# Patient Record
Sex: Male | Born: 1960 | Race: Black or African American | Hispanic: No | Marital: Married | Smoking: Former smoker
Health system: Southern US, Community
[De-identification: ages and names within clinical notes are randomized; demographics above are authoritative.]

## PROBLEM LIST (undated history)

## (undated) DIAGNOSIS — J439 Emphysema, unspecified: Secondary | ICD-10-CM

## (undated) DIAGNOSIS — J302 Other seasonal allergic rhinitis: Secondary | ICD-10-CM

## (undated) DIAGNOSIS — R058 Other specified cough: Secondary | ICD-10-CM

## (undated) DIAGNOSIS — R05 Cough: Secondary | ICD-10-CM

## (undated) DIAGNOSIS — I10 Essential (primary) hypertension: Secondary | ICD-10-CM

## (undated) DIAGNOSIS — J9600 Acute respiratory failure, unspecified whether with hypoxia or hypercapnia: Secondary | ICD-10-CM

## (undated) DIAGNOSIS — R911 Solitary pulmonary nodule: Secondary | ICD-10-CM

## (undated) DIAGNOSIS — T464X5A Adverse effect of angiotensin-converting-enzyme inhibitors, initial encounter: Secondary | ICD-10-CM

## (undated) DIAGNOSIS — K449 Diaphragmatic hernia without obstruction or gangrene: Secondary | ICD-10-CM

## (undated) DIAGNOSIS — N200 Calculus of kidney: Secondary | ICD-10-CM

## (undated) DIAGNOSIS — E785 Hyperlipidemia, unspecified: Secondary | ICD-10-CM

## (undated) DIAGNOSIS — G4736 Sleep related hypoventilation in conditions classified elsewhere: Secondary | ICD-10-CM

## (undated) HISTORY — DX: Adverse effect of angiotensin-converting-enzyme inhibitors, initial encounter: T46.4X5A

## (undated) HISTORY — DX: Diaphragmatic hernia without obstruction or gangrene: K44.9

## (undated) HISTORY — DX: Emphysema, unspecified: G47.36

## (undated) HISTORY — DX: Cough: R05

## (undated) HISTORY — DX: Other seasonal allergic rhinitis: J30.2

## (undated) HISTORY — DX: Essential (primary) hypertension: I10

## (undated) HISTORY — DX: Calculus of kidney: N20.0

## (undated) HISTORY — DX: Hyperlipidemia, unspecified: E78.5

## (undated) HISTORY — DX: Acute respiratory failure, unspecified whether with hypoxia or hypercapnia: J96.00

## (undated) HISTORY — DX: Solitary pulmonary nodule: R91.1

## (undated) HISTORY — DX: Emphysema, unspecified: J43.9

## (undated) HISTORY — DX: Other specified cough: R05.8

---

## 2000-04-04 ENCOUNTER — Emergency Department (HOSPITAL_COMMUNITY): Admission: EM | Admit: 2000-04-04 | Discharge: 2000-04-04 | Payer: Self-pay | Admitting: Emergency Medicine

## 2000-04-04 ENCOUNTER — Encounter: Payer: Self-pay | Admitting: Emergency Medicine

## 2003-07-27 HISTORY — PX: KNEE ARTHROSCOPY: SHX127

## 2005-04-19 ENCOUNTER — Emergency Department (HOSPITAL_COMMUNITY): Admission: EM | Admit: 2005-04-19 | Discharge: 2005-04-19 | Payer: Self-pay | Admitting: Emergency Medicine

## 2005-04-21 ENCOUNTER — Encounter: Admission: RE | Admit: 2005-04-21 | Discharge: 2005-04-21 | Payer: Self-pay | Admitting: Occupational Medicine

## 2009-12-22 ENCOUNTER — Emergency Department (HOSPITAL_COMMUNITY): Admission: EM | Admit: 2009-12-22 | Discharge: 2009-12-22 | Payer: Self-pay | Admitting: Emergency Medicine

## 2010-05-14 ENCOUNTER — Emergency Department (HOSPITAL_COMMUNITY): Admission: EM | Admit: 2010-05-14 | Discharge: 2010-05-14 | Payer: Self-pay | Admitting: Emergency Medicine

## 2010-06-19 ENCOUNTER — Emergency Department (HOSPITAL_COMMUNITY): Admission: EM | Admit: 2010-06-19 | Discharge: 2010-06-19 | Payer: Self-pay | Admitting: Emergency Medicine

## 2010-07-15 ENCOUNTER — Encounter: Payer: Self-pay | Admitting: Pulmonary Disease

## 2010-07-20 ENCOUNTER — Encounter: Payer: Self-pay | Admitting: Pulmonary Disease

## 2010-08-07 ENCOUNTER — Encounter: Payer: Self-pay | Admitting: Pulmonary Disease

## 2010-08-07 ENCOUNTER — Ambulatory Visit
Admission: RE | Admit: 2010-08-07 | Discharge: 2010-08-07 | Payer: Self-pay | Source: Home / Self Care | Attending: Pulmonary Disease | Admitting: Pulmonary Disease

## 2010-08-07 DIAGNOSIS — R0602 Shortness of breath: Secondary | ICD-10-CM | POA: Insufficient documentation

## 2010-08-07 DIAGNOSIS — E785 Hyperlipidemia, unspecified: Secondary | ICD-10-CM | POA: Insufficient documentation

## 2010-08-07 DIAGNOSIS — I10 Essential (primary) hypertension: Secondary | ICD-10-CM | POA: Insufficient documentation

## 2010-08-07 DIAGNOSIS — J439 Emphysema, unspecified: Secondary | ICD-10-CM | POA: Insufficient documentation

## 2010-08-07 DIAGNOSIS — J309 Allergic rhinitis, unspecified: Secondary | ICD-10-CM | POA: Insufficient documentation

## 2010-08-14 ENCOUNTER — Encounter: Payer: Self-pay | Admitting: Pulmonary Disease

## 2010-08-17 ENCOUNTER — Telehealth: Payer: Self-pay | Admitting: Pulmonary Disease

## 2010-08-27 NOTE — Assessment & Plan Note (Signed)
Summary: CONSULT FOR OBSTRUCTIVE LUNG DISEASE//SH   Visit Type:  Initial Consult Copy to:  Dr. Nadyne Coombes Primary Provider/Referring Provider:  Dr. Fara Boros at [facility] in [place]  CC:  Pulmonary consult.  History of Present Illness: 50 yo male for evaluation of dyspnea.  This started about 4 months ago after he hit his back a broke some ribs.  He has been getting a cough with clear to yellow sputum.  He gets tightness and wheezing in his chest.  He was treated for a bronchitis in November 2011 with prednisone.  He has never been told he has asthma, but he does get seasonal allergies.  He gets winded when he is hot shower, bending, or lifting his arms above his head.  He also gets winded when walking up stairs.  He denies fever, sweats, or chills.  His sinuses have been okay.  He denies gland swelling, or joint swelling.  He will get a rash at times on his hands.  He denies heartburn, or abdominal pain.  He smoked 1.5 packs per day until 1996.  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  He says there is a lot of dust.  He denies any prior history of pneumonia or TB.  He has not had any animal or sick exposures.  He is from [place].  He was stationed in [place] while in [employer] in the 1980's.  He has been using asmanex for the past 2 to 3 months.  He is not sure how much this is helping.  He uses ventolin two times a day.  This may help some, but the effects don't seem to last.  Labs from June 19, 2010 were reviewed and unremarkable.  CXR  Procedure date:  06/19/2010  Findings:      CHEST - 2 VIEW    Comparison: 05/14/2010    Findings: Healed right rib fracture.  Normal heart size.   Hyperaerated lungs.  No consolidation, mass, pleural effusion.    IMPRESSION:   No active cardiopulmonary disease.   Preventive Screening-Counseling & Management  Alcohol-Tobacco     Alcohol drinks/day: <1     Smoking Status: quit     Packs/Day: 1.5     Year Started: 1976     Year Quit:  1996  Current Medications (verified): 1)  Losartan Potassium 100 Mg Tabs (Losartan Potassium) .Marland Kitchen.. 1 By Mouth Daily 2)  Amlodipine Besylate 10 Mg Tabs (Amlodipine Besylate) .Marland Kitchen.. 1 By Mouth Daily 3)  Crestor 20 Mg Tabs (Rosuvastatin Calcium) .... 1/2 By Mouth At Bedtime 4)  Niacin 50 Mg Tabs (Niacin) .... 2 By Mouth At Bedtime 5)  Benzonatate 100 Mg Caps (Benzonatate) .Marland Kitchen.. 1 By Mouth Three Times A Day As Needed 6)  Cetirizine Hcl 10 Mg Tabs (Cetirizine Hcl) .Marland Kitchen.. 1 By Mouth Daily 7)  Vitamin D 2000 Unit Tabs (Cholecalciferol) .Marland Kitchen.. 1 By Mouth Daily 8)  Fish Oil 1000 Mg Caps (Omega-3 Fatty Acids) .... 2 By Mouth Daily 9)  Ventolin Hfa 108 (90 Base) Mcg/act Aers (Albuterol Sulfate) .Marland Kitchen.. 1-2 Puffs Up To Four Times Daily As Needed 10)  Hydrocodone-Acetaminophen 5-500 Mg Tabs (Hydrocodone-Acetaminophen) .Marland Kitchen.. 1 By Mouth Three Times A Day 11)  Asmanex 60 Metered Doses 220 Mcg/inh Aepb (Mometasone Furoate) .Marland Kitchen.. 1-2 Puffs Daily 12)  Flunisolide 0.025 % Soln (Flunisolide) .... 2 Sprays in Each Nostril Once or Twice Daily 13)  Urea 20 % Crea (Urea) .... Apply To Affected Area Two Times A Day 14)  Fluocinonide 0.05 %  Oint (Fluocinonide) .... Apply To Affected Area Two Times A Day  Allergies (verified): 1)  ! * Latex 2)  ! Asa  Past History:  Past Medical History: Hypertension Hyperlipidemia Nephrolithiasis Hiatal hernia Seasonal allergies COPD      - Spirometry 07/15/10 FEV1 1.24(27%), FEV1% 39  Past Surgical History: Right knee arthroscopy 2005  Family History: Father - Pacreatic cancer Sister - Heart disease  Social History: Married, has children.  Works as Nature conservation officer.  Quit smoking in 1996, and smoked 1.5 packs per day for 20 years. Smoking Status:  quit Packs/Day:  1.5 Alcohol drinks/day:  <1  Review of Systems       The patient complains of shortness of breath with activity, shortness of breath at rest, productive cough, chest pain, irregular heartbeats, acid heartburn,  indigestion, abdominal pain, sore throat, nasal congestion/difficulty breathing through nose, hand/feet swelling, joint stiffness or pain, and change in color of mucus.  The patient denies non-productive cough, coughing up blood, loss of appetite, weight change, difficulty swallowing, tooth/dental problems, headaches, sneezing, itching, ear ache, anxiety, depression, rash, and fever.    Vital Signs:  Patient profile:   50 year old male Height:      74 inches (187.96 cm) Weight:      218 pounds (99.09 kg) BMI:     28.09 O2 Sat:      95 % on Room air Temp:     97.4 degrees F (36.33 degrees C) oral Pulse rate:   79 / minute BP sitting:   110 / 80  (left arm) Cuff size:   large  Vitals Entered By: Michel Bickers CMA (August 07, 2010 9:19 AM)  O2 Sat at Rest %:  95 O2 Flow:  Room air  Serial Vital Signs/Assessments:  Comments: 10:34 AM Ambulatory Pulse Oximetry  Resting; HR__72___    02 Sat__96% on room air___  Lap1 (185 feet)   HR__85___   02 Sat__93% on room air___ Lap2 (185 feet)   HR__84___   02 Sat__95% on room air___    Lap3 (185 feet)   HR__82___   02 Sat__95% on room air___ _x__Test Completed without Difficulty ___Test Stopped due to:  By: Michel Bickers CMA   CC: Pulmonary consult Is Patient Diabetic? No Comments Medications reviewed with patient Michel Bickers CMA  August 07, 2010 9:59 AM   Physical Exam  General:  normal appearance and healthy appearing.   Eyes:  PERRLA and EOMI.   Ears:  TMs intact and clear with normal canals Nose:  no deformity, discharge, inflammation, or lesions Mouth:  MP 3, no exudate Neck:  no JVD.   Chest Wall:  no deformities noted Lungs:  decreased breath sounds, no wheeze or rales Heart:  regular rate and rhythm, S1, S2 without murmurs, rubs, gallops, or clicks Abdomen:  bowel sounds positive; abdomen soft and non-tender without masses, or organomegaly Msk:  no deformity or scoliosis noted with normal posture Pulses:  pulses  normal Extremities:  no clubbing, cyanosis, edema, or deformity noted Neurologic:  normal CN II-XII and strength normal.   Cervical Nodes:  no significant adenopathy Psych:  alert and cooperative; normal mood and affect; normal attention span and concentration   Impression & Recommendations:  Problem # 1:  DYSPNEA (ICD-786.05) Likely related to COPD.  He does also have hypertension on multiple anti-hypertensive medications, and could have diastolic dysfunction.  He likely also has a component of deconditioning.  If his dyspnea symptoms are not improved further with inhaler therapy, will then investigate  other possible causes of his dyspnea.  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  He has severe obstruction on recent spirometry.  I will continue him on asmanex.  I will add spiriva to his inhaler regimen.  Will check alpha 1 anti-trypsin level.  Will further assess with full pulmonary function test.  Will discuss pulmonary rehab and pneumococcal vaccination at next visit.  Problem # 3:  ALLERGIC RHINITIS (ICD-477.9) This is stable.  Will continue his current sinus regimen.  Medications Added to Medication List This Visit: 1)  Spiriva Handihaler 18 Mcg Caps (Tiotropium bromide monohydrate) .... One puff once daily 2)  Asmanex 60 Metered Doses 220 Mcg/inh Aepb (Mometasone furoate) .Marland Kitchen.. 1-2 puffs daily 3)  Ventolin Hfa 108 (90 Base) Mcg/act Aers (Albuterol sulfate) .... Two puffs up to four times per day as needed 4)  Flunisolide 0.025 % Soln (Flunisolide) .... 2 sprays in each nostril once or twice daily 5)  Fish Oil 1000 Mg Caps (Omega-3 fatty acids) .... 2 by mouth daily 6)  Hydrocodone-acetaminophen 5-500 Mg Tabs (Hydrocodone-acetaminophen) .Marland Kitchen.. 1 by mouth three times a day 7)  Urea 20 % Crea (Urea) .... Apply to affected area two times a day 8)  Fluocinonide 0.05 % Oint (Fluocinonide) .... Apply to affected area two  times a day  Complete Medication List: 1)  Spiriva Handihaler 18 Mcg Caps (Tiotropium bromide monohydrate) .... One puff once daily 2)  Asmanex 60 Metered Doses 220 Mcg/inh Aepb (Mometasone furoate) .Marland Kitchen.. 1-2 puffs daily 3)  Ventolin Hfa 108 (90 Base) Mcg/act Aers (Albuterol sulfate) .... Two puffs up to four times per day as needed 4)  Flunisolide 0.025 % Soln (Flunisolide) .... 2 sprays in each nostril once or twice daily 5)  Cetirizine Hcl 10 Mg Tabs (Cetirizine hcl) .Marland Kitchen.. 1 by mouth daily 6)  Losartan Potassium 100 Mg Tabs (Losartan potassium) .Marland Kitchen.. 1 by mouth daily 7)  Amlodipine Besylate 10 Mg Tabs (Amlodipine besylate) .Marland Kitchen.. 1 by mouth daily 8)  Crestor 20 Mg Tabs (Rosuvastatin calcium) .... 1/2 by mouth at bedtime 9)  Niacin 50 Mg Tabs (Niacin) .... 2 by mouth at bedtime 10)  Fish Oil 1000 Mg Caps (Omega-3 fatty acids) .... 2 by mouth daily 11)  Vitamin D 2000 Unit Tabs (Cholecalciferol) .Marland Kitchen.. 1 by mouth daily 12)  Benzonatate 100 Mg Caps (Benzonatate) .Marland Kitchen.. 1 by mouth three times a day as needed 13)  Ventolin Hfa 108 (90 Base) Mcg/act Aers (Albuterol sulfate) .Marland Kitchen.. 1-2 puffs up to four times daily as needed 14)  Hydrocodone-acetaminophen 5-500 Mg Tabs (Hydrocodone-acetaminophen) .Marland Kitchen.. 1 by mouth three times a day 15)  Urea 20 % Crea (Urea) .... Apply to affected area two times a day 16)  Fluocinonide 0.05 % Oint (Fluocinonide) .... Apply to affected area two times a day  Other Orders: Consultation Level IV (16109) Full Pulmonary Function Test (PFT) TLB-Alpha-1-Antitrypsin Total ( Home Kit) MISC (60454)  Patient Instructions: 1)  Spiriva one puff once daily  2)  Will schedule breathing test (PFT) 3)  Lab test today 4)  Follow up in 3 to 4 weeks Prescriptions: SPIRIVA HANDIHALER 18 MCG CAPS (TIOTROPIUM BROMIDE MONOHYDRATE) one puff once daily  #30 x 6   Entered and Authorized by:   [name] MD   Signed by:   [name] MD on 08/07/2010   Method used:   Print then Give to  Patient   RxID:   [id number]

## 2010-08-27 NOTE — Letter (Signed)
Summary: Return to work Eval/[facility]  Return to work Eval/[facility]   Imported By: Sherian Rein 08/19/2010 09:42:42  _____________________________________________________________________  External Attachment:    Type:   Image     Comment:   External Document

## 2010-08-27 NOTE — Letter (Signed)
Summary: [facility]  [facility]   Imported By: Sherian Rein 08/19/2010 09:39:39  _____________________________________________________________________  External Attachment:    Type:   Image     Comment:   External Document

## 2010-08-27 NOTE — Progress Notes (Signed)
Summary: wants to verify that Alpha 1 was ordered  Phone Note Other Incoming   Caller: bethany with Alpha 1 research Summary of Call: Robert [name] was calling to confirm that Dr. Craige Cotta did order the Alpha 1 Test. Robert [name] can be reached [phone] Initial call taken by: Vedia Coffer,  August 17, 2010 10:04 AM  Follow-up for Phone Call        according to last OV note [facility] did order alpha 1 test. I advised [facility] lab of this. Carron Curie CMA  August 17, 2010 1:28 PM

## 2010-08-27 NOTE — Letter (Signed)
Summary: Medication List/[facility]  Medication List/[facility]   Imported By: Sherian Rein 08/19/2010 09:45:14  _____________________________________________________________________  External Attachment:    Type:   Image     Comment:   External Document

## 2010-09-02 NOTE — Miscellaneous (Signed)
Summary: Alpha-1 Antitrypsin genetic report  Clinical Lists Changes Received report from [facility] [place] [facility].  Genotype MM.  Will have my nurse call to inform patient that blood test for genetic problem for cause of emphysema was negative/normal.  He does not have a genetic problem causing his emphysema.  No change to current treatment plan at this time.  Appended Document: Alpha-1 Antitrypsin genetic report called and spoke with pt and he verbalized understanding of the results and had no questions

## 2010-09-03 ENCOUNTER — Encounter (INDEPENDENT_AMBULATORY_CARE_PROVIDER_SITE_OTHER): Payer: BC Managed Care – PPO

## 2010-09-03 ENCOUNTER — Encounter: Payer: Self-pay | Admitting: Pulmonary Disease

## 2010-09-03 DIAGNOSIS — J4489 Other specified chronic obstructive pulmonary disease: Secondary | ICD-10-CM

## 2010-09-03 DIAGNOSIS — R0602 Shortness of breath: Secondary | ICD-10-CM

## 2010-09-03 DIAGNOSIS — J449 Chronic obstructive pulmonary disease, unspecified: Secondary | ICD-10-CM

## 2010-09-04 ENCOUNTER — Ambulatory Visit (INDEPENDENT_AMBULATORY_CARE_PROVIDER_SITE_OTHER): Payer: Managed Care, Other (non HMO) | Admitting: Pulmonary Disease

## 2010-09-04 ENCOUNTER — Encounter: Payer: Self-pay | Admitting: Pulmonary Disease

## 2010-09-04 DIAGNOSIS — J309 Allergic rhinitis, unspecified: Secondary | ICD-10-CM

## 2010-09-04 DIAGNOSIS — R0602 Shortness of breath: Secondary | ICD-10-CM

## 2010-09-04 DIAGNOSIS — J449 Chronic obstructive pulmonary disease, unspecified: Secondary | ICD-10-CM

## 2010-09-10 NOTE — Assessment & Plan Note (Signed)
Summary: 4 wk rov/sh   Copy to:  Dr. Nadyne Coombes Primary Provider/Referring Provider:  Dr. Fara Boros at [facility] in [place]  CC:  4 week follow up. Pt states his breathing is unchanged. Pt c/o cough w/ occas green to light pink phlem and little wheezing.Marland Kitchen  History of Present Illness: 50 yo male with dyspnea, COPD, and allergic rhinitis.  His breathing is slightly improved with change to his inhalers.  He was changed from asmanex to symbicort by the [place], and told to hold off on starting spiriva.  He still has a cough with clear to yellow sputum.  He gets winded very easily with minimal activity.  His PFT today showed severe obstruction with airtrapping.  Current Medications (verified): 1)  Spiriva Handihaler 18 Mcg Caps (Tiotropium Bromide Monohydrate) .... One Puff Once Daily 2)  Asmanex 60 Metered Doses 220 Mcg/inh Aepb (Mometasone Furoate) .Marland Kitchen.. 1-2 Puffs Daily 3)  Ventolin Hfa 108 (90 Base) Mcg/act Aers (Albuterol Sulfate) .... Two Puffs Up To Four Times Per Day As Needed 4)  Flunisolide 0.025 % Soln (Flunisolide) .... 2 Sprays in Each Nostril Once or Twice Daily 5)  Cetirizine Hcl 10 Mg Tabs (Cetirizine Hcl) .Marland Kitchen.. 1 By Mouth Daily 6)  Losartan Potassium 100 Mg Tabs (Losartan Potassium) .Marland Kitchen.. 1 By Mouth Daily 7)  Amlodipine Besylate 10 Mg Tabs (Amlodipine Besylate) .Marland Kitchen.. 1 By Mouth Daily 8)  Crestor 20 Mg Tabs (Rosuvastatin Calcium) .... 1/2 By Mouth At Bedtime 9)  Niacin 50 Mg Tabs (Niacin) .... 2 By Mouth At Bedtime 10)  Fish Oil 1000 Mg Caps (Omega-3 Fatty Acids) .... 2 By Mouth Daily 11)  Vitamin D 2000 Unit Tabs (Cholecalciferol) .Marland Kitchen.. 1 By Mouth Daily 12)  Benzonatate 100 Mg Caps (Benzonatate) .Marland Kitchen.. 1 By Mouth Three Times A Day As Needed 13)  Hydrocodone-Acetaminophen 5-500 Mg Tabs (Hydrocodone-Acetaminophen) .Marland Kitchen.. 1 By Mouth Three Times A Day As Needed 14)  Urea 20 % Crea (Urea) .... Apply To Affected Area Two Times A Day As Needed 15)  Fluocinonide 0.05 % Oint (Fluocinonide) .... Apply To  Affected Area Two Times A Day As Needed 16)  Symbicort 80-4.5 Mcg/act Aero (Budesonide-Formoterol Fumarate) .Marland Kitchen.. 1 Puff Two Times A Day 17)  Hydrochlorothiazide 25 Mg Tabs (Hydrochlorothiazide) .... 1/2 Once Daily  Allergies: 1)  ! * Latex  Past History:  Past Medical History: Hypertension Hyperlipidemia Nephrolithiasis Hiatal hernia Seasonal allergies COPD      - Spirometry 07/15/10 FEV1 1.24(27%), FEV1% 39      - PFT 09/03/10>>FEV1 1.66(42%), FEV1% 37, TLC 7.87(101%), DLCO 92%, no BD      - Alpha 1 AT level normal with MM genotype from 08/07/10  Past Surgical History: Reviewed history from 08/07/2010 and no changes required. Right knee arthroscopy 2005  Family History: Reviewed history from 08/07/2010 and no changes required. Father - Pancreatic cancer Sister - Heart disease  Social History: Reviewed history from 08/07/2010 and no changes required. Married, has children.  Works as Nature conservation officer.  Quit smoking in 1996, and smoked 1.5 packs per day for 20 years.  Vital Signs:  Patient profile:   50 year old male Height:      74 inches Weight:      220 pounds BMI:     28.35 O2 Sat:      96 % on Room air Temp:     97.8 degrees F oral Pulse rate:   68 / minute BP sitting:   122 / 88  (left arm) Cuff size:   large  Vitals Entered By: Carver Fila (September 04, 2010 9:16 AM)  O2 Flow:  Room air CC: 4 week follow up. Pt states his breathing is unchanged. Pt c/o cough w/ occas green to light pink phlem, little wheezing. Comments meds and allergies updated Phone number updated Carver Fila  September 04, 2010 9:16 AM    Physical Exam  General:  normal appearance and healthy appearing.   Nose:  no deformity, discharge, inflammation, or lesions Mouth:  MP 3, no exudate Neck:  no JVD.   Lungs:  decreased breath sounds, no wheeze or rales Heart:  regular rate and rhythm, S1, S2 without murmurs, rubs, gallops, or clicks Extremities:  no clubbing, cyanosis, edema, or deformity  noted Neurologic:  normal CN II-XII and strength normal.   Cervical Nodes:  no significant adenopathy Psych:  alert and cooperative; normal mood and affect; normal attention span and concentration   Impression & Recommendations:  Problem # 1:  DYSPNEA (ICD-786.05) Likely related to severe COPD, possible diastolic dysfunction, and deconditioning.  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  He has severe obstruction on PFT.    He is to continue symbicort, and I have advised him to restart spiriva.  He can continue as needed ventolin.  [facility], and [facility] for [facility].  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  As such I have advised him to check with his Oncologist department and the [place] about whether he would be a candidate for long-term disability.  In addition, I have explained that dust or fume exposure could exacerbate his respiratory condition.  He will also check with [facility] about checking set up with pulmonary clinic at the [place].  I have advised him that he could then decide if he would like to continue to follow up with [place].  Problem # 3:  ALLERGIC RHINITIS (ICD-477.9) Stable.  Medications Added to Medication List This Visit: 1)  Symbicort 80-4.5 Mcg/act Aero (Budesonide-formoterol fumarate) .Marland Kitchen.. 1 puff two times a day 2)  Hydrocodone-acetaminophen 5-500 Mg Tabs (Hydrocodone-acetaminophen) .Marland Kitchen.. 1 by mouth three times a day as needed 3)  Urea 20 % Crea (Urea) .... Apply to affected area two times a day as needed 4)  Fluocinonide 0.05 % Oint (Fluocinonide) .... Apply to affected area two times a day as needed 5)  Hydrochlorothiazide 25 Mg Tabs (Hydrochlorothiazide) .... 1/2 once daily  Complete Medication List: 1)  Symbicort 80-4.5 Mcg/act  Aero (Budesonide-formoterol fumarate) .Marland Kitchen.. 1 puff two times a day 2)  Spiriva Handihaler 18 Mcg Caps (Tiotropium bromide monohydrate) .... One puff once daily 3)  Ventolin Hfa 108 (90 Base) Mcg/act Aers (Albuterol sulfate) .... Two puffs up to four times per day as needed 4)  Flunisolide 0.025 % Soln (Flunisolide) .... 2 sprays in each nostril once or twice daily 5)  Cetirizine Hcl 10 Mg Tabs (Cetirizine hcl) .Marland Kitchen.. 1 by mouth daily 6)  Losartan Potassium 100 Mg Tabs (Losartan potassium) .Marland Kitchen.. 1 by mouth daily 7)  Amlodipine Besylate 10 Mg Tabs (Amlodipine besylate) .Marland Kitchen.. 1 by mouth daily 8)  Crestor 20 Mg Tabs (Rosuvastatin calcium) .... 1/2 by mouth at bedtime 9)  Niacin 50 Mg Tabs (Niacin) .... 2 by mouth at bedtime 10)  Fish Oil 1000 Mg Caps (Omega-3 fatty acids) .Marland KitchenMarland KitchenMarland Kitchen  2 by mouth daily 11)  Vitamin D 2000 Unit Tabs (Cholecalciferol) .Marland Kitchen.. 1 by mouth daily 12)  Benzonatate 100 Mg Caps (Benzonatate) .Marland Kitchen.. 1 by mouth three times a day as needed 13)  Hydrocodone-acetaminophen 5-500 Mg Tabs (Hydrocodone-acetaminophen) .Marland Kitchen.. 1 by mouth three times a day as needed 14)  Urea 20 % Crea (Urea) .... Apply to affected area two times a day as needed 15)  Fluocinonide 0.05 % Oint (Fluocinonide) .... Apply to affected area two times a day as needed 16)  Hydrochlorothiazide 25 Mg Tabs (Hydrochlorothiazide) .... 1/2 once daily  Other Orders: Est. Patient Level IV (16109)  Patient Instructions: 1)  Spiriva one puff once daily 2)  Symbicort two puffs two times a day 3)  Ventolin two puffs up to four times per day as needed  4)  [facility] with [facility] about getting [facility] shot, and pulmonary rehab referral 5)  Speak with your human resource dept. about disability for severe COPD 6)  Follow up in 2 months

## 2010-09-10 NOTE — Assessment & Plan Note (Signed)
Summary: full pft/ms   Allergies: 1)  ! * Latex 2)  ! Asa   Other Orders: Carbon Monoxide diffusing w/capacity [phone]) Lung Volumes/Gas dilution or washout (98119) Spirometry (Pre & Post) [phone])

## 2010-09-10 NOTE — Miscellaneous (Signed)
Summary: Pulmonary function test   Pulmonary Function Test Date: 09/03/2010 Height (in.): 74 Gender: Male  Pre-Spirometry FVC    Value: 4.39 L/min   Pred: 5.49 L/min     % Pred: 80 % FEV1    Value: 1.56 L     Pred: 3.98 L     % Pred: 39 % FEV1/FVC  Value: 35 %     Pred: 72 %     % Pred: . % FEF 25-75  Value: 0.50 L/min   Pred: 3.79 L/min     % Pred: 13 %  Post-Spirometry FVC    Value: 4.43 L/min   Pred: 5.49 L/min     % Pred: 81 % FEV1    Value: 1.66 L     Pred: 3.98 L     % Pred: 42 % FEV1/FVC  Value: 37 %     Pred: 72 %     % Pred: . % FEF 25-75  Value: 0.56 L/min   Pred: 3.79 L/min     % Pred: 15 %  Lung Volumes TLC    Value: 7.87 L   % Pred: 101 % RV    Value: 3.37 L   % Pred: 137 % DLCO    Value: 27.8 %   % Pred: 92 % DLCO/VA  Value: 4.93 %   % Pred: 122 %  Comments: Severe obstruction.  No bronchodilator response.  Airtrapping with increased RV/TLC ratio.  Normal diffusion. Clinical Lists Changes  Observations: Added new observation of PFT COMMENTS: Severe obstruction.  No bronchodilator response.  Airtrapping with increased RV/TLC ratio.  Normal diffusion. (09/03/2010 10:26) Added new observation of DLCO/VA%EXP: 122 % (09/03/2010 10:26) Added new observation of DLCO/VA: 4.93 % (09/03/2010 10:26) Added new observation of DLCO % EXPEC: 92 % (09/03/2010 10:26) Added new observation of DLCO: 27.8 % (09/03/2010 10:26) Added new observation of RV % EXPECT: 137 % (09/03/2010 10:26) Added new observation of RV: 3.37 L (09/03/2010 10:26) Added new observation of TLC % EXPECT: 101 % (09/03/2010 10:26) Added new observation of TLC: 7.87 L (09/03/2010 10:26) Added new observation of FEF2575%EXPS: 15 % (09/03/2010 10:26) Added new observation of PSTFEF25/75P: 3.79  (09/03/2010 10:26) Added new observation of PSTFEF25/75%: 0.56 L/min (09/03/2010 10:26) Added new observation of PSTFEV1/FCV%: . % (09/03/2010 10:26) Added new observation of FEV1FVCPRDPS: 72 % (09/03/2010  10:26) Added new observation of PSTFEV1/FVC: 37 % (09/03/2010 10:26) Added new observation of POSTFEV1%PRD: 42 % (09/03/2010 10:26) Added new observation of FEV1PRDPST: 3.98 L (09/03/2010 10:26) Added new observation of POST FEV1: 1.66 L/min (09/03/2010 10:26) Added new observation of POST FVC%EXP: 81 % (09/03/2010 10:26) Added new observation of FVCPRDPST: 5.49 L/min (09/03/2010 10:26) Added new observation of POST FVC: 4.43 L (09/03/2010 10:26) Added new observation of FEF % EXPEC: 13 % (09/03/2010 10:26) Added new observation of FEF25-75%PRE: 3.79 L/min (09/03/2010 10:26) Added new observation of FEF 25-75%: 0.50 L/min (09/03/2010 10:26) Added new observation of FEV1/FVC%EXP: . % (09/03/2010 10:26) Added new observation of FEV1/FVC PRE: 72 % (09/03/2010 10:26) Added new observation of FEV1/FVC: 35 % (09/03/2010 10:26) Added new observation of FEV1 % EXP: 39 % (09/03/2010 10:26) Added new observation of FEV1 PREDICT: 3.98 L (09/03/2010 10:26) Added new observation of FEV1: 1.56 L (09/03/2010 10:26) Added new observation of FVC % EXPECT: 80 % (09/03/2010 10:26) Added new observation of FVC PREDICT: 5.49 L (09/03/2010 10:26) Added new observation of FVC: 4.39 L (09/03/2010 10:26) Added new observation of PFT HEIGHT: 74  (09/03/2010 10:26) Added new  observation of PFT DATE: 09/03/2010  (09/03/2010 10:26)

## 2010-10-06 LAB — COMPREHENSIVE METABOLIC PANEL
ALT: 15 U/L (ref 0–53)
AST: 17 U/L (ref 0–37)
Albumin: 4.2 g/dL (ref 3.5–5.2)
Calcium: 9.7 mg/dL (ref 8.4–10.5)
GFR calc Af Amer: >60 mL/min (ref >60)
Sodium: 139 mEq/L (ref 135–145)
Total Protein: 7.5 g/dL (ref 6.0–8.3)

## 2010-10-06 LAB — DIFFERENTIAL
Eosinophils Absolute: 0.1 10*3/uL (ref 0.0–0.7)
Eosinophils Relative: 3 % (ref 0–5)
Lymphs Abs: 1 10*3/uL (ref 0.7–4.0)
Monocytes Relative: 10 % (ref 3–12)

## 2010-10-06 LAB — CBC
Hemoglobin: 15.8 g/dL (ref 13.0–17.0)
MCHC: 35.7 g/dL (ref 30.0–36.0)
RDW: 13.3 % (ref 11.5–15.5)

## 2010-10-06 LAB — URINALYSIS, ROUTINE W REFLEX MICROSCOPIC
Ketones, ur: NEGATIVE mg/dL
Nitrite: NEGATIVE
Protein, ur: NEGATIVE mg/dL
pH: 7 (ref 5.0–8.0)

## 2010-10-06 LAB — POCT CARDIAC MARKERS
CKMB, poc: 1.5 ng/mL (ref 1.0–8.0)
Troponin i, poc: <0.05 ng/mL (ref 0.00–0.09)

## 2010-10-06 LAB — BRAIN NATRIURETIC PEPTIDE: Pro B Natriuretic peptide (BNP): <30 pg/mL (ref 0.0–100.0)

## 2010-11-12 ENCOUNTER — Encounter: Payer: Self-pay | Admitting: Pulmonary Disease

## 2010-11-16 ENCOUNTER — Encounter: Payer: Self-pay | Admitting: Pulmonary Disease

## 2010-11-16 ENCOUNTER — Ambulatory Visit (INDEPENDENT_AMBULATORY_CARE_PROVIDER_SITE_OTHER): Payer: BC Managed Care – PPO | Admitting: Pulmonary Disease

## 2010-11-16 VITALS — BP 122/88 | HR 79 | Temp 97.0°F | Ht 73.0 in | Wt 232.2 lb

## 2010-11-16 DIAGNOSIS — J309 Allergic rhinitis, unspecified: Secondary | ICD-10-CM

## 2010-11-16 DIAGNOSIS — J449 Chronic obstructive pulmonary disease, unspecified: Secondary | ICD-10-CM

## 2010-11-16 MED ORDER — TIOTROPIUM BROMIDE MONOHYDRATE 18 MCG IN CAPS: 18.0000 ug | ORAL_CAPSULE | Freq: Every day | RESPIRATORY_TRACT | Status: DC

## 2010-11-16 NOTE — Assessment & Plan Note (Signed)
He is to continue his current sinus regimen.

## 2010-11-16 NOTE — Assessment & Plan Note (Signed)
He is slowing improving from recent bronchitis.  I don't think he needs additional prednisone or antibiotics.  He is to continue his current inhaler regimen.  Will renew spiriva.  Will complete his disability forms and mail them to him.

## 2010-11-16 NOTE — Progress Notes (Signed)
Subjective:    Patient ID: Robert [name], male    DOB: 12-17-1960, 50 y.o.   MRN: [id number]  HPI 50 yo male with dyspnea, COPD, and allergic rhinitis.  Rattle in lt chest with pain, better now, went to [place], then went to urgent care and tx with prednisone and benzonatate, cough present for two weeks, winded easily, pink to yellow with blood streak in sputum, no fever, occ wheeze, sinus okay, throat okay, had nose bleed yesterday, occ headaches, out of spiriva, using ventolin few per day, had CXR with urgent care and told negative.  Past Medical History  Diagnosis Date  . COPD (chronic obstructive pulmonary disease)     1- spirometry 12.12.11 > FEV1 1.24 (27%), FEV1% 39.  2- PFT 2.9.12 > FEV1 1.66 (42%), FEV1% 37, TLC 7.87 (101%), DLCO 92%, no BD.  3- Alpha 1 AT level normal with MM genotype from 08/07/10  . Hyperlipidemia   . Hypertension   . Nephrolithiasis   . Hiatal hernia   . Seasonal allergies      Family History  Problem Relation Age of Onset  . Pancreatic cancer Father   . Heart disease Sister      History   Social History  . Marital Status: Married    Spouse Name: N/A    Number of Children: N/A  . Years of Education: N/A   Occupational History  . Not on file.   Social History Main Topics  . Smoking status: Former Smoker -- 1.5 packs/day for 16 years    Types: Cigarettes    Quit date: 07/26/1994  . Smokeless tobacco: Never Used  . Alcohol Use: Yes     rare  . Drug Use: Not on file  . Sexually Active: Not on file   Other Topics Concern  . Not on file   Social History Narrative   Has children     Allergies  Allergen Reactions  . Aspirin     REACTION: intolerance  . Latex      Outpatient Prescriptions Prior to Visit  Medication Sig Dispense Refill  . albuterol (VENTOLIN HFA) 108 (90 BASE) MCG/ACT inhaler Inhale 2 puffs into the lungs every 6 (six) hours as needed.        Marland Kitchen amLODipine (NORVASC) 10 MG tablet Take 10 mg by mouth daily.        .  benzonatate (TESSALON) 100 MG capsule Take 100 mg by mouth 3 (three) times daily as needed.        . budesonide-formoterol (SYMBICORT) 80-4.5 MCG/ACT inhaler 1 puff twice a day      . cetirizine (ZYRTEC) 10 MG tablet Take 10 mg by mouth daily.        . Cholecalciferol (VITAMIN D) 2000 UNITS CAPS Take 1 capsule by mouth daily.        . fish oil-omega-3 fatty acids 1000 MG capsule 2 capsules by mouth once daily         . flunisolide (NASALIDE) 0.025 % SOLN Inhale 2 sprays into the lungs 2 (two) times daily.        . fluocinolone (SYNALAR) 0.025 % ointment Apply topically 2 (two) times daily.        . hydrochlorothiazide 25 MG tablet 1/2 tab by mouth once daily       . HYDROcodone-acetaminophen (VICODIN) 5-500 MG per tablet Take 1 tablet by mouth 3 (three) times daily as needed.        Marland Kitchen losartan (COZAAR) 100 MG tablet Take 100 mg by  mouth daily.        . niacin 50 MG tablet 2 tabs by mouth at bedtime         . rosuvastatin (CRESTOR) 20 MG tablet 1/2 tab by mouth at bedtime         . tiotropium (SPIRIVA) 18 MCG inhalation capsule Place 18 mcg into inhaler and inhale daily.        . urea (CARMOL) 20 % cream Apply topically 2 (two) times daily as needed.            Review of Systems     Objective:   Physical Exam Filed Vitals:   11/16/10 0958  BP: 122/88  Pulse: 79  Temp: 97 F (36.1 C)  TempSrc: Oral  Height: 6\' 1"  (1.854 m)  Weight: 232 lb 3.2 oz (105.325 kg)  SpO2: 92%   General: normal appearance and healthy appearing.  Nose: boggy mucosa, clear drainage on right, no tenderness  Mouth: MP 3, no exudate  Neck: no JVD.  Lungs: decreased breath sounds, no wheeze or rales, no pain on palpation Heart: regular rate and rhythm, S1, S2 without murmurs, rubs, gallops, or clicks  Extremities: no clubbing, cyanosis, edema, or deformity noted  Neurologic: normal CN II-XII and strength normal.  Cervical Nodes: no significant adenopathy  Psych: alert and cooperative; normal mood and  affect; normal attention span and concentration        Assessment & Plan:   CHRONIC OBSTRUCTIVE PULMONARY DISEASE, SEVERE He is slowing improving from recent bronchitis.  I don't think he needs additional prednisone or antibiotics.  He is to continue his current inhaler regimen.  Will renew spiriva.  Will complete his disability forms and mail them to him.  ALLERGIC RHINITIS He is to continue his current sinus regimen.    Updated Medication List Outpatient Encounter Prescriptions as of 11/16/2010  Medication Sig Dispense Refill  . albuterol (VENTOLIN HFA) 108 (90 BASE) MCG/ACT inhaler Inhale 2 puffs into the lungs every 6 (six) hours as needed.        Marland Kitchen amLODipine (NORVASC) 10 MG tablet Take 10 mg by mouth daily.        . benzonatate (TESSALON) 100 MG capsule Take 100 mg by mouth 3 (three) times daily as needed.        . budesonide-formoterol (SYMBICORT) 80-4.5 MCG/ACT inhaler 1 puff twice a day      . cetirizine (ZYRTEC) 10 MG tablet Take 10 mg by mouth daily.        . Cholecalciferol (VITAMIN D) 2000 UNITS CAPS Take 1 capsule by mouth daily.        . fish oil-omega-3 fatty acids 1000 MG capsule 2 capsules by mouth once daily         . flunisolide (NASALIDE) 0.025 % SOLN Inhale 2 sprays into the lungs 2 (two) times daily.        . fluocinolone (SYNALAR) 0.025 % ointment Apply topically 2 (two) times daily.        . hydrochlorothiazide 25 MG tablet 1/2 tab by mouth once daily       . HYDROcodone-acetaminophen (VICODIN) 5-500 MG per tablet Take 1 tablet by mouth 3 (three) times daily as needed.        Marland Kitchen losartan (COZAAR) 100 MG tablet Take 100 mg by mouth daily.        . naproxen (NAPROSYN) 500 MG tablet 500 mg as needed.       . niacin 50 MG tablet 2 tabs by mouth  at bedtime         . rosuvastatin (CRESTOR) 20 MG tablet 1/2 tab by mouth at bedtime         . tiotropium (SPIRIVA) 18 MCG inhalation capsule Place 1 capsule (18 mcg total) into inhaler and inhale daily.  30 capsule  6    . urea (CARMOL) 20 % cream Apply topically 2 (two) times daily as needed.        Marland Kitchen DISCONTD: tiotropium (SPIRIVA) 18 MCG inhalation capsule Place 18 mcg into inhaler and inhale daily.        Marland Kitchen DISCONTD: fluticasone (FLONASE) 50 MCG/ACT nasal spray

## 2010-11-16 NOTE — Patient Instructions (Signed)
Follow up in 4 months 

## 2010-11-20 ENCOUNTER — Telehealth: Payer: Self-pay | Admitting: Pulmonary Disease

## 2010-11-20 NOTE — Telephone Encounter (Signed)
Pt had some questions about his disability forms that were completed by [facility]. I was having problems following what the patient was needing so I advised I will get the copy of the forms and then call him back. I have contact [employer] at [phone] and they are checking on forms and will call back once located.Carron Curie, CMA

## 2010-11-20 NOTE — Telephone Encounter (Signed)
I spoke to [employer] and they states they have not received the forms yet. They sates forms are not delivered until 3:30pm so they will look through the forms at that time and let us know if they have the attending Physician statement. Pt aware of this.  Carron Curie, CMA

## 2010-11-23 NOTE — Telephone Encounter (Signed)
Pt returning call.Robert [name] ° °

## 2010-11-23 NOTE — Telephone Encounter (Signed)
Called [employer], spoke with Johnny Bridge.  States on Friday this form was scanned into pt's chart under the media tab.  [id number]

## 2010-11-23 NOTE — Telephone Encounter (Signed)
Spoke w/ pt and he states he received his disability paper work in the mail that was filled out by Dr. Craige Cotta. Pt states on part of the form where it asks about physical impairments Dr. 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 Pt states he needs the form to be redone b/c he states he can only uses 40% of his joints and he is unable to do any activity due to his extreme SOB. Pt wants to know if he needs to come in to be re evaluated or if Dr. Craige Cotta would re fill out the form again. The copy of form filled out by [employer] is under the pt's chart review in the media tab. Please advise Dr. Craige Cotta. Thanks.   Carver Fila, CMA

## 2010-12-01 ENCOUNTER — Emergency Department (HOSPITAL_COMMUNITY): Payer: BC Managed Care – PPO

## 2010-12-01 ENCOUNTER — Emergency Department (HOSPITAL_COMMUNITY)
Admission: EM | Admit: 2010-12-01 | Discharge: 2010-12-01 | Disposition: A | Discharge status: Home / Self Care | Payer: BC Managed Care – PPO | Attending: Emergency Medicine | Admitting: Emergency Medicine

## 2010-12-01 DIAGNOSIS — I10 Essential (primary) hypertension: Secondary | ICD-10-CM | POA: Insufficient documentation

## 2010-12-01 DIAGNOSIS — R059 Cough, unspecified: Secondary | ICD-10-CM | POA: Insufficient documentation

## 2010-12-01 DIAGNOSIS — J4489 Other specified chronic obstructive pulmonary disease: Secondary | ICD-10-CM | POA: Insufficient documentation

## 2010-12-01 DIAGNOSIS — Z87442 Personal history of urinary calculi: Secondary | ICD-10-CM | POA: Insufficient documentation

## 2010-12-01 DIAGNOSIS — J40 Bronchitis, not specified as acute or chronic: Secondary | ICD-10-CM | POA: Insufficient documentation

## 2010-12-01 DIAGNOSIS — J449 Chronic obstructive pulmonary disease, unspecified: Secondary | ICD-10-CM | POA: Insufficient documentation

## 2010-12-01 DIAGNOSIS — R05 Cough: Secondary | ICD-10-CM | POA: Insufficient documentation

## 2010-12-01 DIAGNOSIS — E7800 Pure hypercholesterolemia, unspecified: Secondary | ICD-10-CM | POA: Insufficient documentation

## 2010-12-10 DIAGNOSIS — R911 Solitary pulmonary nodule: Secondary | ICD-10-CM

## 2010-12-10 HISTORY — DX: Solitary pulmonary nodule: R91.1

## 2010-12-10 NOTE — Telephone Encounter (Signed)
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  He would need to discuss with his primary physician whether other medical conditions would preclude him from doing this kind of work.

## 2010-12-15 NOTE — Telephone Encounter (Signed)
[email or web address].Jennifer Castillo, CMA  

## 2010-12-17 NOTE — Telephone Encounter (Signed)
Pt aware per Dr. 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 Pt verbalized understanding.

## 2011-06-28 ENCOUNTER — Ambulatory Visit (INDEPENDENT_AMBULATORY_CARE_PROVIDER_SITE_OTHER): Payer: BC Managed Care – PPO | Admitting: Pulmonary Disease

## 2011-06-28 ENCOUNTER — Encounter: Payer: Self-pay | Admitting: Pulmonary Disease

## 2011-06-28 VITALS — BP 102/70 | HR 99 | Temp 97.9°F | Ht 73.0 in | Wt 243.8 lb

## 2011-06-28 DIAGNOSIS — J309 Allergic rhinitis, unspecified: Secondary | ICD-10-CM

## 2011-06-28 DIAGNOSIS — J984 Other disorders of lung: Secondary | ICD-10-CM

## 2011-06-28 DIAGNOSIS — R911 Solitary pulmonary nodule: Secondary | ICD-10-CM

## 2011-06-28 DIAGNOSIS — J449 Chronic obstructive pulmonary disease, unspecified: Secondary | ICD-10-CM

## 2011-06-28 DIAGNOSIS — K921 Melena: Secondary | ICD-10-CM | POA: Insufficient documentation

## 2011-06-28 NOTE — Assessment & Plan Note (Signed)
He reports having episode of blood in stool.  Advised him to discuss with his PCP at [facility] in [place].

## 2011-06-28 NOTE — Assessment & Plan Note (Signed)
Will arrange for oxygen test at night to determine if nocturnal hypoxemia is contributing to his sleep troubles.    He is to continue his inhaler regimen.

## 2011-06-28 NOTE — Assessment & Plan Note (Signed)
Stable

## 2011-06-28 NOTE — Assessment & Plan Note (Signed)
He was recently found to have a lung nodule on chest xray.  He reports have a CT chest at [facility], and was told he would have follow up appointment with pulmonary doctor there.  I have asked that he have his CT chest report sent over here.

## 2011-06-28 NOTE — Progress Notes (Signed)
Chief Complaint  Patient presents with  . Follow-up    Pt states he has his good and bad days with his breathing, cough w/ yellow to white phlem, little wheezing.    CC: Robert [name], [facility]  History of Present Illness: Robert [name] is a 50 y.o. male former smoker with dyspnea, COPD, and allergic rhinitis.  He has noticed more trouble with his sleep.  He can fall asleep, but has trouble sleeping through the night.  He has occasional cough with clear to yellow sputum.  He notices his breathing is worse in cold weather.  He denies fever, hemoptysis, or chest pain.  He uses his ventolin once per day.  He is using a spacer for his inhalers.  He had an episode of blood in his stool recently.  He denies abdominal pain or nausea.  He was found to have a lung nodule on xray over the Summer.  He had a CT chest at the [place] in [place], but is not sure what this showed.  He was told he was going to get [facility], but this has not been done yet.  Past Medical History  Diagnosis Date  . COPD (chronic obstructive pulmonary disease)     1- spirometry 12.12.11 > FEV1 1.24 (27%), FEV1% 39.  2- PFT 2.9.12 > FEV1 1.66 (42%), FEV1% 37, TLC 7.87 (101%), DLCO 92%, no BD.  3- Alpha 1 AT level normal with MM genotype from 08/07/10  . Hyperlipidemia   . Hypertension   . Nephrolithiasis   . Hiatal hernia   . Seasonal allergies     Past Surgical History  Procedure Date  . Knee arthroscopy 2005    right knee    Current Outpatient Prescriptions on File Prior to Visit  Medication Sig Dispense Refill  . albuterol (VENTOLIN HFA) 108 (90 BASE) MCG/ACT inhaler Inhale 2 puffs into the lungs every 6 (six) hours as needed.        Marland Kitchen amLODipine (NORVASC) 10 MG tablet Take 10 mg by mouth daily.        . benzonatate (TESSALON) 100 MG capsule Take 100 mg by mouth 3 (three) times daily as needed.        . budesonide-formoterol (SYMBICORT) 80-4.5 MCG/ACT inhaler 1 puff  twice a day      . cetirizine (ZYRTEC) 10 MG tablet Take 10 mg by mouth daily.        . Cholecalciferol (VITAMIN D) 2000 UNITS CAPS Take 1 capsule by mouth daily.        . fish oil-omega-3 fatty acids 1000 MG capsule 2 capsules by mouth once daily         . flunisolide (NASALIDE) 0.025 % SOLN Inhale 2 sprays into the lungs 2 (two) times daily.        . fluocinolone (SYNALAR) 0.025 % ointment Apply topically 2 (two) times daily.        . hydrochlorothiazide 25 MG tablet 1/2 tab by mouth once daily       . HYDROcodone-acetaminophen (VICODIN) 5-500 MG per tablet Take 1 tablet by mouth 3 (three) times daily as needed.        Marland Kitchen losartan (COZAAR) 100 MG tablet Take 100 mg by mouth daily.        . naproxen (NAPROSYN) 500 MG tablet 500 mg as needed.       . niacin 50 MG tablet 2 tabs by mouth at bedtime         .  rosuvastatin (CRESTOR) 20 MG tablet 1/2 tab by mouth at bedtime         . tiotropium (SPIRIVA) 18 MCG inhalation capsule Place 1 capsule (18 mcg total) into inhaler and inhale daily.  30 capsule  6  . urea (CARMOL) 20 % cream Apply topically 2 (two) times daily as needed.          Allergies  Allergen Reactions  . Aspirin     REACTION: intolerance  . Latex     Physical Exam:  Blood pressure 102/70, pulse 99, temperature 97.9 F (36.6 C), temperature source Oral, height 6\' 1"  (1.854 m), weight 243 lb 12.8 oz (110.587 kg), SpO2 94.00%. Body mass index is 32.17 kg/(m^2).  General - Obese HEENT - no sinus tenderness, no oral exudate, no LAN Cardiac - s1s2 regular, no murmur Chest - no wheeze/rales/dullness Abdomen - no pain, soft Extremities - no edema Skin - no rashes Neurologic - normal strength Psychiatric - normal mood, behavior  Dg Pneumonia Chest 2v  12/01/2010  *RADIOLOGY REPORT*  Clinical Data: Cough.  Congestion.  Fever.  CHEST - 2 VIEW  Comparison: 06/19/2010  Findings: Old right rib fracture noted.  Vague nodularity projecting just above the minor fissure on the  right measures up to 1.1 cm.  There is indistinct density projecting over the left fifth anterior rib, possibly from pleural thickening or nodule.  No pleural effusion identified.  Cardiac and mediastinal contours appear unremarkable.   IMPRESSION:  1.  Abnormal nodular density in the right midlung, with vague pleural-based density in the left midlung.  True pulmonary nodules cannot be excluded.  CT the chest with contrast is recommended.   Original Report Authenticated By: Dellia Cloud, M.D.   CHEST - 2 VIEW 06/19/10: Comparison: 05/14/2010  Findings: Healed right rib fracture. Normal heart size. Hyperaerated lungs. No consolidation, mass, pleural effusion.  IMPRESSION:  No active cardiopulmonary disease.  Provider: Fredricka Bonine    Assessment/Plan:  CHRONIC OBSTRUCTIVE PULMONARY DISEASE, SEVERE Will arrange for oxygen test at night to determine if nocturnal hypoxemia is contributing to his sleep troubles.    He is to continue his inhaler regimen.  Pulmonary nodule He was recently found to have a lung nodule on chest xray.  He reports have a CT chest at [facility], and was told he would have follow up appointment with pulmonary doctor there.  I have asked that he have his CT chest report sent over here.  Blood in stool He reports having episode of blood in stool.  Advised him to discuss with his PCP at [facility] in [place].  ALLERGIC RHINITIS Stable.     Outpatient Encounter Prescriptions as of 06/28/2011  Medication Sig Dispense Refill  . albuterol (VENTOLIN HFA) 108 (90 BASE) MCG/ACT inhaler Inhale 2 puffs into the lungs every 6 (six) hours as needed.        Marland Kitchen amLODipine (NORVASC) 10 MG tablet Take 10 mg by mouth daily.        . benzonatate (TESSALON) 100 MG capsule Take 100 mg by mouth 3 (three) times daily as needed.        . budesonide-formoterol (SYMBICORT) 80-4.5 MCG/ACT inhaler 1 puff twice a day      . cetirizine (ZYRTEC) 10 MG tablet Take 10 mg by mouth daily.          . Cholecalciferol (VITAMIN D) 2000 UNITS CAPS Take 1 capsule by mouth daily.        . fish oil-omega-3 fatty acids 1000 MG  capsule 2 capsules by mouth once daily         . flunisolide (NASALIDE) 0.025 % SOLN Inhale 2 sprays into the lungs 2 (two) times daily.        . fluocinolone (SYNALAR) 0.025 % ointment Apply topically 2 (two) times daily.        . hydrochlorothiazide 25 MG tablet 1/2 tab by mouth once daily       . HYDROcodone-acetaminophen (VICODIN) 5-500 MG per tablet Take 1 tablet by mouth 3 (three) times daily as needed.        Marland Kitchen losartan (COZAAR) 100 MG tablet Take 100 mg by mouth daily.        . naproxen (NAPROSYN) 500 MG tablet 500 mg as needed.       . niacin 50 MG tablet 2 tabs by mouth at bedtime         . rosuvastatin (CRESTOR) 20 MG tablet 1/2 tab by mouth at bedtime         . tiotropium (SPIRIVA) 18 MCG inhalation capsule Place 1 capsule (18 mcg total) into inhaler and inhale daily.  30 capsule  6  . urea (CARMOL) 20 % cream Apply topically 2 (two) times daily as needed.          Ariyannah Pauling Pager:  [phone] 06/28/2011, 11:10 AM

## 2011-06-28 NOTE — Patient Instructions (Addendum)
Will arrange for oxygen test at night Follow up in 6 months

## 2011-07-15 ENCOUNTER — Telehealth: Payer: Self-pay | Admitting: Pulmonary Disease

## 2011-07-15 ENCOUNTER — Encounter: Payer: Self-pay | Admitting: Pulmonary Disease

## 2011-07-15 DIAGNOSIS — J439 Emphysema, unspecified: Secondary | ICD-10-CM | POA: Insufficient documentation

## 2011-07-15 DIAGNOSIS — J449 Chronic obstructive pulmonary disease, unspecified: Secondary | ICD-10-CM

## 2011-07-15 DIAGNOSIS — G4736 Sleep related hypoventilation in conditions classified elsewhere: Secondary | ICD-10-CM | POA: Insufficient documentation

## 2011-07-15 DIAGNOSIS — R0902 Hypoxemia: Secondary | ICD-10-CM

## 2011-07-15 NOTE — Telephone Encounter (Signed)
Room air ONO 06/29/11>>Test time 7 hrs 58 min.  Baseline SpO2 90.1%, low SpO2 79%, Spent 2 hrs 25 min (30.3%) with SpO2 < 88%.  Discussed results with pt.  Will arrange for 2 liters oxygen and repeat ONO.

## 2011-08-10 ENCOUNTER — Encounter: Payer: Self-pay | Admitting: Pulmonary Disease

## 2011-08-10 ENCOUNTER — Telehealth: Payer: Self-pay | Admitting: Pulmonary Disease

## 2011-08-10 NOTE — Telephone Encounter (Signed)
ONO with 2 liters 07/31/11>>Test time 7 hrs 2 min.  Mean SpO2 94.6%, low SpO2 84%.  Spent 4 min 36 sec with SpO2 < 88%.  Will have my nurse inform patient that oxygen level was good while using 2 liters oxygen at night.  Will continue current set up.

## 2011-08-10 NOTE — Telephone Encounter (Signed)
I spoke with patient about results and he verbalized understanding and had no questions 

## 2011-08-18 ENCOUNTER — Encounter: Payer: Self-pay | Admitting: Pulmonary Disease

## 2011-10-15 ENCOUNTER — Telehealth: Payer: Self-pay | Admitting: Pulmonary Disease

## 2011-10-15 NOTE — Telephone Encounter (Signed)
Dr. Craige Cotta, I have placed this in you look out. Please advise thanks

## 2011-10-20 NOTE — Telephone Encounter (Signed)
I have completed form

## 2011-10-20 NOTE — Telephone Encounter (Signed)
[id number] x1--this has been placed in the mail to mail out to his home

## 2011-10-21 NOTE — Telephone Encounter (Signed)
Pt aware. Robert [name], CMA  

## 2012-01-11 ENCOUNTER — Ambulatory Visit (INDEPENDENT_AMBULATORY_CARE_PROVIDER_SITE_OTHER): Payer: BC Managed Care – PPO | Admitting: Pulmonary Disease

## 2012-01-11 ENCOUNTER — Encounter: Payer: Self-pay | Admitting: Pulmonary Disease

## 2012-01-11 VITALS — BP 122/80 | HR 94 | Temp 98.0°F | Ht 74.0 in | Wt 261.4 lb

## 2012-01-11 DIAGNOSIS — J449 Chronic obstructive pulmonary disease, unspecified: Secondary | ICD-10-CM

## 2012-01-11 DIAGNOSIS — R911 Solitary pulmonary nodule: Secondary | ICD-10-CM

## 2012-01-11 DIAGNOSIS — J4489 Other specified chronic obstructive pulmonary disease: Secondary | ICD-10-CM

## 2012-01-11 DIAGNOSIS — R0902 Hypoxemia: Secondary | ICD-10-CM

## 2012-01-11 DIAGNOSIS — J309 Allergic rhinitis, unspecified: Secondary | ICD-10-CM

## 2012-01-11 NOTE — Assessment & Plan Note (Signed)
He is stable on current inhaler regimen.

## 2012-01-11 NOTE — Assessment & Plan Note (Signed)
Explained to him rationale for nocturnal oxygen use.  He will try to use this every night.

## 2012-01-11 NOTE — Assessment & Plan Note (Signed)
Stable

## 2012-01-11 NOTE — Assessment & Plan Note (Signed)
I have not received his CT chest results from [place].  He says he has f/u there soon.  I have asked him to forward copy of this for my review.

## 2012-01-11 NOTE — Patient Instructions (Signed)
Use oxygen every night Have CT scan report from [place] sent to my office Follow up in 6 months

## 2012-01-11 NOTE — Progress Notes (Signed)
Chief Complaint  Patient presents with  . Follow-up    breathing has unchanged. Pt c/o cough w/ very little light green phlem occasionally, little wheezing and chest tx. Pt uses his oxygen at night 3 nights out of the week   CC: Robert [name], [facility] [place]  History of Present Illness: Robert [name] is a 51 y.o. male former smoker with GOLD 4COPD, and nocturnal hypoxia.  He has not been using his oxygen every night.  He wasn't sure why he needed to use this.  He gets occasional cough and wheeze.  He brings up clear sputum.  He denies fever or hemoptysis.  He gets occasional cramps in his right upper abdomin, and intermittent back pain.  He had one episode of chest pain associated with shallow breathing several weeks ago, but not since.  Room air ONO 06/29/11>>Test time 7 hrs 58 min. Baseline SpO2 90.1%, low SpO2 79%, Spent 2 hrs 25 min (30.3%) with SpO2 < 88%.  ONO with 2 liters 07/31/11>>Test time 7 hrs 2 min. Mean SpO2 94.6%, low SpO2 84%. Spent 4 min 36 sec with SpO2 < 88%.  Past Medical History  Diagnosis Date  . COPD (chronic obstructive pulmonary disease)     1- spirometry 12.12.11 > FEV1 1.24 (27%), FEV1% 39.  2- PFT 2.9.12 > FEV1 1.66 (42%), FEV1% 37, TLC 7.87 (101%), DLCO 92%, no BD.  3- Alpha 1 AT level normal with MM genotype from 08/07/10  . Hyperlipidemia   . Hypertension   . Nephrolithiasis   . Hiatal hernia   . Seasonal allergies     Past Surgical History  Procedure Date  . Knee arthroscopy 2005    right knee    Current Outpatient Prescriptions on File Prior to Visit  Medication Sig Dispense Refill  . albuterol (VENTOLIN HFA) 108 (90 BASE) MCG/ACT inhaler Inhale 2 puffs into the lungs every 6 (six) hours as needed.        Marland Kitchen amLODipine (NORVASC) 10 MG tablet Take 10 mg by mouth daily.        . budesonide-formoterol (SYMBICORT) 80-4.5 MCG/ACT inhaler 1 puff twice a day      . cetirizine (ZYRTEC) 10 MG tablet Take 10 mg by mouth daily.        . Cholecalciferol  (VITAMIN D) 2000 UNITS CAPS Take 1 capsule by mouth daily.        . fish oil-omega-3 fatty acids 1000 MG capsule 2 capsules by mouth once daily         . flunisolide (NASALIDE) 0.025 % SOLN Inhale 2 sprays into the lungs 2 (two) times daily.        . fluocinolone (SYNALAR) 0.025 % ointment Apply topically 2 (two) times daily.        . hydrochlorothiazide 25 MG tablet 1/2 tab by mouth once daily       . HYDROcodone-acetaminophen (VICODIN) 5-500 MG per tablet Take 1 tablet by mouth 3 (three) times daily as needed.        Marland Kitchen losartan (COZAAR) 100 MG tablet Take 100 mg by mouth daily.        . naproxen (NAPROSYN) 500 MG tablet 500 mg as needed.       . niacin 50 MG tablet 2 tabs by mouth at bedtime         . rosuvastatin (CRESTOR) 20 MG tablet 1/2 tab by mouth at bedtime         . tiotropium (SPIRIVA) 18 MCG inhalation capsule Place 1 capsule (18  mcg total) into inhaler and inhale daily.  30 capsule  6  . urea (CARMOL) 20 % cream Apply topically 2 (two) times daily as needed.          Allergies  Allergen Reactions  . Aspirin     REACTION: intolerance  . Latex     Physical Exam:  Blood pressure 122/80, pulse 94, temperature 98 F (36.7 C), temperature source Oral, height 6\' 2"  (1.88 m), weight 261 lb 6.4 oz (118.57 kg), SpO2 90.00%. Body mass index is 33.56 kg/(m^2).  General - Obese HEENT - no sinus tenderness, no oral exudate, no LAN Cardiac - s1s2 regular, no murmur Chest - no wheeze/rales/dullness Abdomen - no pain, soft Extremities - no edema Skin - no rashes Neurologic - normal strength Psychiatric - normal mood, behavior      Assessment/Plan:    Outpatient Encounter Prescriptions as of 01/11/2012  Medication Sig Dispense Refill  . albuterol (VENTOLIN HFA) 108 (90 BASE) MCG/ACT inhaler Inhale 2 puffs into the lungs every 6 (six) hours as needed.        Marland Kitchen amLODipine (NORVASC) 10 MG tablet Take 10 mg by mouth daily.        . budesonide-formoterol (SYMBICORT) 80-4.5  MCG/ACT inhaler 1 puff twice a day      . cetirizine (ZYRTEC) 10 MG tablet Take 10 mg by mouth daily.        . Cholecalciferol (VITAMIN D) 2000 UNITS CAPS Take 1 capsule by mouth daily.        . fish oil-omega-3 fatty acids 1000 MG capsule 2 capsules by mouth once daily         . flunisolide (NASALIDE) 0.025 % SOLN Inhale 2 sprays into the lungs 2 (two) times daily.        . fluocinolone (SYNALAR) 0.025 % ointment Apply topically 2 (two) times daily.        . hydrochlorothiazide 25 MG tablet 1/2 tab by mouth once daily       . HYDROcodone-acetaminophen (VICODIN) 5-500 MG per tablet Take 1 tablet by mouth 3 (three) times daily as needed.        Marland Kitchen losartan (COZAAR) 100 MG tablet Take 100 mg by mouth daily.        . naproxen (NAPROSYN) 500 MG tablet 500 mg as needed.       . niacin 50 MG tablet 2 tabs by mouth at bedtime         . rosuvastatin (CRESTOR) 20 MG tablet 1/2 tab by mouth at bedtime         . tiotropium (SPIRIVA) 18 MCG inhalation capsule Place 1 capsule (18 mcg total) into inhaler and inhale daily.  30 capsule  6  . urea (CARMOL) 20 % cream Apply topically 2 (two) times daily as needed.        Marland Kitchen DISCONTD: benzonatate (TESSALON) 100 MG capsule Take 100 mg by mouth 3 (three) times daily as needed.          Robert [name] Pager:  [phone] 01/11/2012, 12:27 PM

## 2012-03-16 ENCOUNTER — Telehealth: Payer: Self-pay | Admitting: Pulmonary Disease

## 2012-03-16 NOTE — Telephone Encounter (Signed)
Per Shawna Orleans this was placed over in [facility] look at

## 2012-03-16 NOTE — Telephone Encounter (Signed)
Received 2 pages. Sent to Dr. Craige Cotta. 03/16/2012/SD

## 2012-03-16 NOTE — Telephone Encounter (Signed)
Received 4 pages. Sent to Dr. Craige Cotta. 03/16/2012/SD

## 2012-03-16 NOTE — Telephone Encounter (Signed)
Received 1 page. Sent to Dr. Craige Cotta. 03/16/2012

## 2012-03-20 ENCOUNTER — Telehealth: Payer: Self-pay | Admitting: Pulmonary Disease

## 2012-03-20 NOTE — Telephone Encounter (Signed)
Forward 10 pages from patient to Dr. [name] for review on 03-20-12 ym

## 2012-03-20 NOTE — Telephone Encounter (Signed)
PT has forms from the [place] that he wants Dr Craige Cotta to fill out so that he can qualify for VA benefits.  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  Pt instructed to bring form by and Dr Craige Cotta would have to review it to see what he would need.  Pt will bring form by for review.

## 2012-03-20 NOTE — Telephone Encounter (Signed)
I have placed forms in Dr. Evlyn Courier look at. Please advise thanks

## 2012-04-04 NOTE — Telephone Encounter (Signed)
I called # provided above - rang several times then received busy signal.  [facility]

## 2012-04-04 NOTE — Telephone Encounter (Signed)
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  Please schedule him for an ROV to specifically review this issue.

## 2012-04-05 NOTE — Telephone Encounter (Signed)
Spoke with pt and notified of recs per Dr Craige Cotta. He verbalized understanding. I have scheduled him for 04/27/12 at 12 noon to discuss forms. Pt states nothing further needed.

## 2012-04-27 ENCOUNTER — Ambulatory Visit (INDEPENDENT_AMBULATORY_CARE_PROVIDER_SITE_OTHER): Payer: BC Managed Care – PPO | Admitting: Pulmonary Disease

## 2012-04-27 ENCOUNTER — Encounter: Payer: Self-pay | Admitting: Pulmonary Disease

## 2012-04-27 VITALS — BP 148/98 | HR 92 | Temp 98.3°F | Ht 73.0 in | Wt 255.0 lb

## 2012-04-27 DIAGNOSIS — R0902 Hypoxemia: Secondary | ICD-10-CM

## 2012-04-27 DIAGNOSIS — J309 Allergic rhinitis, unspecified: Secondary | ICD-10-CM

## 2012-04-27 DIAGNOSIS — J449 Chronic obstructive pulmonary disease, unspecified: Secondary | ICD-10-CM

## 2012-04-27 DIAGNOSIS — Z23 Encounter for immunization: Secondary | ICD-10-CM

## 2012-04-27 NOTE — Patient Instructions (Signed)
Flu shot today ° °Follow up in 4 months °

## 2012-04-27 NOTE — Progress Notes (Signed)
Chief Complaint  Patient presents with  . Follow-up    no change in breathing since last OV.   CC: Robert [name], [facility]  History of Present Illness: Robert [name] is a 51 y.o. male former smoker with GOLD 4COPD, and nocturnal hypoxia.  His breathing has been about the same.  He has occasional cough.  He denies chest pain, or leg swelling.  He keeps up with his activity at steady pace, but can't do anything to quickly.  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  Tests: PFT 09/03/10>>FEV1 1.66(42%), FEV1% 37, TLC 7.87(101%), DLCO 92%, no BD  08/07/10 Alpha 1 AT level normal with MM genotype Room air ONO 06/29/11>>Test time 7 hrs 58 min. Baseline SpO2 90.1%, low SpO2 79%, Spent 2 hrs 25 min (30.3%) with SpO2 < 88%. ONO with 2 liters 07/31/11>>Test time 7 hrs 2 min. Mean SpO2 94.6%, low SpO2 84%. Spent 4 min 36 sec with SpO2 < 88%.  Past Medical History  Diagnosis Date  . COPD (chronic obstructive pulmonary disease)     1- spirometry 12.12.11 > FEV1 1.24 (27%), FEV1% 39.  2- PFT 2.9.12 > FEV1 1.66 (42%), FEV1% 37, TLC 7.87 (101%), DLCO 92%, no BD.  3- Alpha 1 AT level normal with MM genotype from 08/07/10  . Hyperlipidemia   . Hypertension   . Nephrolithiasis   . Hiatal hernia   . Seasonal allergies     Past Surgical History  Procedure Date  . Knee arthroscopy 2005    right knee    Current Outpatient Prescriptions on File Prior to Visit  Medication Sig Dispense Refill  . albuterol (VENTOLIN HFA) 108 (90 BASE) MCG/ACT inhaler Inhale 2 puffs into the lungs every 6 (six) hours as needed.        Marland Kitchen amLODipine (NORVASC) 10 MG tablet Take 10 mg by mouth daily.        . budesonide-formoterol (SYMBICORT) 80-4.5 MCG/ACT inhaler 1 puff twice a day      . cetirizine (ZYRTEC) 10 MG tablet Take 10 mg by mouth daily.        . Cholecalciferol (VITAMIN D) 2000 UNITS CAPS Take 1 capsule by mouth daily.          . fish oil-omega-3 fatty acids 1000 MG capsule 2 capsules by mouth once daily         . flunisolide (NASALIDE) 0.025 % SOLN Inhale 2 sprays into the lungs 2 (two) times daily.        . fluocinolone (SYNALAR) 0.025 % ointment Apply topically 2 (two) times daily.        . hydrochlorothiazide 25 MG tablet 1/2 tab by mouth once daily       . HYDROcodone-acetaminophen (VICODIN) 5-500 MG per tablet Take 1 tablet by mouth 3 (three) times daily as needed.        Marland Kitchen losartan (COZAAR) 100 MG tablet Take 100 mg by mouth daily.        . naproxen (NAPROSYN) 500 MG tablet 500 mg as needed.       . niacin 50 MG tablet 2 tabs by mouth at bedtime         . rosuvastatin (CRESTOR) 20 MG tablet 1/2 tab by mouth at bedtime         . tiotropium (SPIRIVA) 18 MCG inhalation capsule Place 1 capsule (18 mcg total) into inhaler and inhale daily.  30 capsule  6  . urea (CARMOL)  20 % cream Apply topically 2 (two) times daily as needed.          Allergies  Allergen Reactions  . Aspirin     REACTION: intolerance  . Latex     Physical Exam:  Filed Vitals:   04/27/12 1143  BP: 148/98  Pulse: 92  Temp: 98.3 F (36.8 C)  TempSrc: Oral  Height: 6\' 1"  (1.854 m)  Weight: 255 lb (115.667 kg)  SpO2: 91%    Body mass index is 33.64 kg/(m^2).  Wt Readings from Last 3 Encounters:  04/27/12 255 lb (115.667 kg)  01/11/12 261 lb 6.4 oz (118.57 kg)  06/28/11 243 lb 12.8 oz (110.587 kg)    General - Obese HEENT - No sinus tenderness, no oral exudate, no LAN Cardiac - s1s2 regular, no murmur Chest - No wheeze/rales/dullness Abdomen - soft, non tender Extremities - No edema Skin - No rashes Neurologic - Normal strength Psychiatric - Normal mood, behavior   Assessment/Plan:    Outpatient Encounter Prescriptions as of 04/27/2012  Medication Sig Dispense Refill  . albuterol (VENTOLIN HFA) 108 (90 BASE) MCG/ACT inhaler Inhale 2 puffs into the lungs every 6 (six) hours as needed.        Marland Kitchen amLODipine  (NORVASC) 10 MG tablet Take 10 mg by mouth daily.        . budesonide-formoterol (SYMBICORT) 80-4.5 MCG/ACT inhaler 1 puff twice a day      . cetirizine (ZYRTEC) 10 MG tablet Take 10 mg by mouth daily.        . Cholecalciferol (VITAMIN D) 2000 UNITS CAPS Take 1 capsule by mouth daily.        Marland Kitchen doxycycline (VIBRA-TABS) 100 MG tablet Take 1 tablet by mouth as directed.      . fish oil-omega-3 fatty acids 1000 MG capsule 2 capsules by mouth once daily         . flunisolide (NASALIDE) 0.025 % SOLN Inhale 2 sprays into the lungs 2 (two) times daily.        . fluocinolone (SYNALAR) 0.025 % ointment Apply topically 2 (two) times daily.        . hydrochlorothiazide 25 MG tablet 1/2 tab by mouth once daily       . HYDROcodone-acetaminophen (VICODIN) 5-500 MG per tablet Take 1 tablet by mouth 3 (three) times daily as needed.        Marland Kitchen losartan (COZAAR) 100 MG tablet Take 100 mg by mouth daily.        . naproxen (NAPROSYN) 500 MG tablet 500 mg as needed.       . niacin 50 MG tablet 2 tabs by mouth at bedtime         . rosuvastatin (CRESTOR) 20 MG tablet 1/2 tab by mouth at bedtime         . tiotropium (SPIRIVA) 18 MCG inhalation capsule Place 1 capsule (18 mcg total) into inhaler and inhale daily.  30 capsule  6  . urea (CARMOL) 20 % cream Apply topically 2 (two) times daily as needed.          Farzana Koci Pager:  [phone] 04/27/2012, 11:48 AM

## 2012-05-09 ENCOUNTER — Encounter: Payer: Self-pay | Admitting: Pulmonary Disease

## 2012-05-09 NOTE — Assessment & Plan Note (Signed)
He is stable on current inhaler regimen.  He got his flu shot today.  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

## 2012-05-09 NOTE — Assessment & Plan Note (Signed)
Continue nocturnal oxygen use.     

## 2012-05-09 NOTE — Assessment & Plan Note (Signed)
Stable

## 2012-06-29 ENCOUNTER — Telehealth: Payer: Self-pay | Admitting: Pulmonary Disease

## 2012-06-29 NOTE — Telephone Encounter (Signed)
I spoke with the pt and he is requesting a copy of paperwork from 02-2012 to be mailed to him, which I have done. He states he will need a new one completed and I advised him to have insurance send [place] a new copy and we will get it to Dr. Craige Cotta. Carron Curie, CMA

## 2012-07-21 ENCOUNTER — Encounter: Payer: Self-pay | Admitting: Pulmonary Disease

## 2012-07-21 ENCOUNTER — Ambulatory Visit (INDEPENDENT_AMBULATORY_CARE_PROVIDER_SITE_OTHER): Payer: BC Managed Care – PPO | Admitting: Pulmonary Disease

## 2012-07-21 VITALS — BP 124/88 | HR 104 | Temp 98.4°F | Ht 74.0 in | Wt 260.0 lb

## 2012-07-21 DIAGNOSIS — J449 Chronic obstructive pulmonary disease, unspecified: Secondary | ICD-10-CM

## 2012-07-21 DIAGNOSIS — J309 Allergic rhinitis, unspecified: Secondary | ICD-10-CM

## 2012-07-21 DIAGNOSIS — R0902 Hypoxemia: Secondary | ICD-10-CM

## 2012-07-21 NOTE — Assessment & Plan Note (Signed)
Stable

## 2012-07-21 NOTE — Progress Notes (Signed)
Chief Complaint  Patient presents with  . COPD    Breathing is unchanged since last OV. Reports increased SOB and cough with production of green mucus. Denies chest pain or tightness.   CC: [name], [facility]  History of Present Illness: Robert [name] is a 51 y.o. male former smoker with GOLD 4COPD, and nocturnal hypoxia.  He has noticed more cough over the past few days.  He did have green sputum, but it is now more clear.  He is not having wheeze.  He denies fever, sinus congestion, hemoptysis, or chest pain.  He tries not to use his ventolin much.  He continues to use oxygen at night.  Tests: PFT 09/03/10>>FEV1 1.66(42%), FEV1% 37, TLC 7.87(101%), DLCO 92%, no BD  08/07/10 Alpha 1 AT level normal with MM genotype Room air ONO 06/29/11>>Test time 7 hrs 58 min. Baseline SpO2 90.1%, low SpO2 79%, Spent 2 hrs 25 min (30.3%) with SpO2 < 88%. ONO with 2 liters 07/31/11>>Test time 7 hrs 2 min. Mean SpO2 94.6%, low SpO2 84%. Spent 4 min 36 sec with SpO2 < 88%.  Past Medical History  Diagnosis Date  . COPD (chronic obstructive pulmonary disease)        . Hyperlipidemia   . Hypertension   . Nephrolithiasis   . Hiatal hernia   . Seasonal allergies     Past Surgical History  Procedure Date  . Knee arthroscopy 2005    right knee    Current Outpatient Prescriptions on File Prior to Visit  Medication Sig Dispense Refill  . albuterol (VENTOLIN HFA) 108 (90 BASE) MCG/ACT inhaler Inhale 2 puffs into the lungs every 6 (six) hours as needed.        Marland Kitchen amLODipine (NORVASC) 10 MG tablet Take 10 mg by mouth daily.        . budesonide-formoterol (SYMBICORT) 80-4.5 MCG/ACT inhaler 1 puff twice a day      . cetirizine (ZYRTEC) 10 MG tablet Take 10 mg by mouth daily.        . Cholecalciferol (VITAMIN D) 2000 UNITS CAPS Take 1 capsule by mouth daily.        Marland Kitchen doxycycline (VIBRA-TABS) 100 MG tablet Take 1 tablet by mouth as directed.      . fish oil-omega-3 fatty acids 1000 MG capsule 2  capsules by mouth once daily         . flunisolide (NASALIDE) 0.025 % SOLN Inhale 2 sprays into the lungs 2 (two) times daily.        . fluocinolone (SYNALAR) 0.025 % ointment Apply topically 2 (two) times daily.        . hydrochlorothiazide 25 MG tablet 1/2 tab by mouth once daily       . HYDROcodone-acetaminophen (VICODIN) 5-500 MG per tablet Take 1 tablet by mouth 3 (three) times daily as needed.        Marland Kitchen losartan (COZAAR) 100 MG tablet Take 100 mg by mouth daily.        . naproxen (NAPROSYN) 500 MG tablet 500 mg as needed.       . niacin 50 MG tablet 2 tabs by mouth at bedtime         . rosuvastatin (CRESTOR) 20 MG tablet 1/2 tab by mouth at bedtime         . tiotropium (SPIRIVA) 18 MCG inhalation capsule Place 1 capsule (18 mcg total) into inhaler and inhale daily.  30 capsule  6  . urea (CARMOL) 20 % cream Apply topically  2 (two) times daily as needed.          Allergies  Allergen Reactions  . Aspirin     REACTION: intolerance  . Latex     Physical Exam:  Filed Vitals:   07/21/12 1349  BP: 124/88  Pulse: 104  Temp: 98.4 F (36.9 C)  TempSrc: Oral  Height: 6\' 2"  (1.88 m)  Weight: 260 lb (117.935 kg)  SpO2: 93%    Body mass index is 33.38 kg/(m^2).   Wt Readings from Last 3 Encounters:  07/21/12 260 lb (117.935 kg)  04/27/12 255 lb (115.667 kg)  01/11/12 261 lb 6.4 oz (118.57 kg)    General - Obese HEENT - No sinus tenderness, no oral exudate, no LAN Cardiac - s1s2 regular, no murmur Chest - No wheeze/rales/dullness Abdomen - soft, non tender Extremities - No edema Skin - No rashes Neurologic - Normal strength Psychiatric - Normal mood, behavior   Assessment/Plan:  [name], MD [facility] Pulmonary/Critical Care 07/21/2012, 2:10 PM Pager:  [phone] After 3pm call: [phone]

## 2012-07-21 NOTE — Assessment & Plan Note (Signed)
Continue nocturnal oxygen use.

## 2012-07-21 NOTE — Patient Instructions (Signed)
Follow up in 6 months 

## 2012-07-21 NOTE — Assessment & Plan Note (Signed)
I have advised him to try using his albuterol more if he needs to.  I don't think he needs prednisone or antibiotics at present.  Advised him to call if his symptoms get worse.

## 2012-09-04 ENCOUNTER — Ambulatory Visit: Payer: BC Managed Care – PPO | Admitting: Pulmonary Disease

## 2012-09-18 ENCOUNTER — Encounter: Payer: Self-pay | Admitting: Pulmonary Disease

## 2012-09-22 ENCOUNTER — Telehealth: Payer: Self-pay | Admitting: Pulmonary Disease

## 2012-09-22 NOTE — Telephone Encounter (Signed)
According to letter it looks like they are requesting medical requests but will also have VS review this as well. Placed in his look at folder.

## 2012-09-28 NOTE — Telephone Encounter (Signed)
Reviewed form.  They are requesting office records.  Please send requested records.

## 2012-09-28 NOTE — Telephone Encounter (Signed)
I have sent this down to medical records.

## 2012-09-28 NOTE — Telephone Encounter (Signed)
Pt was also made aware of this. Nothing further was needed

## 2013-01-19 ENCOUNTER — Ambulatory Visit: Payer: BC Managed Care – PPO | Admitting: Pulmonary Disease

## 2013-07-26 ENCOUNTER — Emergency Department (HOSPITAL_COMMUNITY): Payer: BC Managed Care – PPO

## 2013-07-26 ENCOUNTER — Inpatient Hospital Stay (HOSPITAL_COMMUNITY)
Admission: EM | Admit: 2013-07-26 | Discharge: 2013-08-07 | DRG: 208 | Disposition: A | Discharge status: Home Health | Payer: BC Managed Care – PPO | Attending: Critical Care Medicine | Admitting: Critical Care Medicine

## 2013-07-26 DIAGNOSIS — E785 Hyperlipidemia, unspecified: Secondary | ICD-10-CM | POA: Diagnosis present

## 2013-07-26 DIAGNOSIS — Z888 Allergy status to other drugs, medicaments and biological substances status: Secondary | ICD-10-CM

## 2013-07-26 DIAGNOSIS — G4736 Sleep related hypoventilation in conditions classified elsewhere: Secondary | ICD-10-CM

## 2013-07-26 DIAGNOSIS — K30 Functional dyspepsia: Secondary | ICD-10-CM

## 2013-07-26 DIAGNOSIS — Z886 Allergy status to analgesic agent status: Secondary | ICD-10-CM

## 2013-07-26 DIAGNOSIS — R7309 Other abnormal glucose: Secondary | ICD-10-CM | POA: Diagnosis present

## 2013-07-26 DIAGNOSIS — I9589 Other hypotension: Secondary | ICD-10-CM | POA: Diagnosis present

## 2013-07-26 DIAGNOSIS — I10 Essential (primary) hypertension: Secondary | ICD-10-CM | POA: Diagnosis present

## 2013-07-26 DIAGNOSIS — R911 Solitary pulmonary nodule: Secondary | ICD-10-CM

## 2013-07-26 DIAGNOSIS — J309 Allergic rhinitis, unspecified: Secondary | ICD-10-CM

## 2013-07-26 DIAGNOSIS — R1013 Epigastric pain: Secondary | ICD-10-CM

## 2013-07-26 DIAGNOSIS — K5900 Constipation, unspecified: Secondary | ICD-10-CM | POA: Diagnosis present

## 2013-07-26 DIAGNOSIS — T380X5A Adverse effect of glucocorticoids and synthetic analogues, initial encounter: Secondary | ICD-10-CM | POA: Diagnosis present

## 2013-07-26 DIAGNOSIS — Z800 Family history of malignant neoplasm of digestive organs: Secondary | ICD-10-CM

## 2013-07-26 DIAGNOSIS — J9620 Acute and chronic respiratory failure, unspecified whether with hypoxia or hypercapnia: Principal | ICD-10-CM | POA: Diagnosis present

## 2013-07-26 DIAGNOSIS — K3189 Other diseases of stomach and duodenum: Secondary | ICD-10-CM | POA: Diagnosis not present

## 2013-07-26 DIAGNOSIS — J9600 Acute respiratory failure, unspecified whether with hypoxia or hypercapnia: Secondary | ICD-10-CM

## 2013-07-26 DIAGNOSIS — Z9981 Dependence on supplemental oxygen: Secondary | ICD-10-CM

## 2013-07-26 DIAGNOSIS — J439 Emphysema, unspecified: Secondary | ICD-10-CM | POA: Diagnosis present

## 2013-07-26 DIAGNOSIS — G9340 Encephalopathy, unspecified: Secondary | ICD-10-CM | POA: Diagnosis present

## 2013-07-26 DIAGNOSIS — R5381 Other malaise: Secondary | ICD-10-CM | POA: Diagnosis present

## 2013-07-26 DIAGNOSIS — Z87891 Personal history of nicotine dependence: Secondary | ICD-10-CM

## 2013-07-26 DIAGNOSIS — Z79899 Other long term (current) drug therapy: Secondary | ICD-10-CM

## 2013-07-26 DIAGNOSIS — E876 Hypokalemia: Secondary | ICD-10-CM | POA: Diagnosis present

## 2013-07-26 DIAGNOSIS — N170 Acute kidney failure with tubular necrosis: Secondary | ICD-10-CM | POA: Diagnosis present

## 2013-07-26 DIAGNOSIS — Z23 Encounter for immunization: Secondary | ICD-10-CM

## 2013-07-26 DIAGNOSIS — Z91040 Latex allergy status: Secondary | ICD-10-CM

## 2013-07-26 DIAGNOSIS — J441 Chronic obstructive pulmonary disease with (acute) exacerbation: Secondary | ICD-10-CM | POA: Diagnosis present

## 2013-07-26 HISTORY — DX: Acute respiratory failure, unspecified whether with hypoxia or hypercapnia: J96.00

## 2013-07-26 LAB — CBC WITH DIFFERENTIAL/PLATELET
Basophils Absolute: 0 10*3/uL (ref 0.0–0.1)
Basophils Relative: 0 % (ref 0–1)
Eosinophils Absolute: 0 10*3/uL (ref 0.0–0.7)
Eosinophils Relative: 0 % (ref 0–5)
HCT: 48 % (ref 39.0–52.0)
Hemoglobin: 16.4 g/dL (ref 13.0–17.0)
Lymphocytes Relative: 18 % (ref 12–46)
Lymphs Abs: 2 10*3/uL (ref 0.7–4.0)
MCH: 29.9 pg (ref 26.0–34.0)
MCHC: 34.2 g/dL (ref 30.0–36.0)
MCV: 87.6 fL (ref 78.0–100.0)
Monocytes Absolute: 2.1 10*3/uL — ABNORMAL HIGH (ref 0.1–1.0)
Monocytes Relative: 19 % — ABNORMAL HIGH (ref 3–12)
Neutro Abs: 7 10*3/uL (ref 1.7–7.7)
Neutrophils Relative %: 63 % (ref 43–77)
Platelets: 335 10*3/uL (ref 150–400)
RBC: 5.48 MIL/uL (ref 4.22–5.81)
RDW: 13.3 % (ref 11.5–15.5)
WBC: 11.1 10*3/uL — ABNORMAL HIGH (ref 4.0–10.5)

## 2013-07-26 LAB — BASIC METABOLIC PANEL
BUN: 10 mg/dL (ref 6–23)
CO2: 33 mEq/L — ABNORMAL HIGH (ref 19–32)
Calcium: 9.9 mg/dL (ref 8.4–10.5)
Chloride: 84 mEq/L — ABNORMAL LOW (ref 96–112)
Creatinine, Ser: 0.85 mg/dL (ref 0.50–1.35)
GFR calc Af Amer: >90 mL/min (ref >90)
GFR calc non Af Amer: >90 mL/min (ref >90)
Glucose, Bld: 189 mg/dL — ABNORMAL HIGH (ref 70–99)
Potassium: 3.3 mEq/L — ABNORMAL LOW (ref 3.7–5.3)
Sodium: 132 mEq/L — ABNORMAL LOW (ref 137–147)

## 2013-07-26 LAB — BLOOD GAS, ARTERIAL
Acid-Base Excess: 4.6 mmol/L — ABNORMAL HIGH (ref 0.0–2.0)
Bicarbonate: 37.3 mEq/L — ABNORMAL HIGH (ref 20.0–24.0)
Drawn by: 235321
FIO2: 1 %
MECHVT: 620 mL
O2 Saturation: 99 %
PEEP: 5 cmH2O
Patient temperature: 98.6
RATE: 18 resp/min
TCO2: 33.6 mmol/L (ref 0–100)
pCO2 arterial: 98.2 mmHg (ref 35.0–45.0)
pH, Arterial: 7.205 — ABNORMAL LOW (ref 7.350–7.450)
pO2, Arterial: 390 mmHg — ABNORMAL HIGH (ref 80.0–100.0)

## 2013-07-26 LAB — URINALYSIS, ROUTINE W REFLEX MICROSCOPIC
Bilirubin Urine: NEGATIVE
Glucose, UA: NEGATIVE mg/dL
Ketones, ur: NEGATIVE mg/dL
Leukocytes, UA: NEGATIVE
Nitrite: NEGATIVE
Protein, ur: 100 mg/dL — AB
Specific Gravity, Urine: 1.023 (ref 1.005–1.030)
Urobilinogen, UA: 1 mg/dL (ref 0.0–1.0)
pH: 5.5 (ref 5.0–8.0)

## 2013-07-26 LAB — URINE MICROSCOPIC-ADD ON

## 2013-07-26 LAB — TROPONIN I: Troponin I: <0.3 ng/mL (ref <0.30)

## 2013-07-26 MED ORDER — LEVOFLOXACIN IN D5W 750 MG/150ML IV SOLN: 750.0000 mg | INTRAVENOUS | Status: DC

## 2013-07-26 MED ORDER — IPRATROPIUM BROMIDE 0.02 % IN SOLN
0.5000 mg | Freq: Four times a day (QID) | RESPIRATORY_TRACT | Status: DC
  Administered 2013-07-27 (×2): 0.5 mg via RESPIRATORY_TRACT
  Filled 2013-07-26 (×2): qty 2.5

## 2013-07-26 MED ORDER — PHENYLEPHRINE HCL 10 MG/ML IJ SOLN
30.0000 ug/min | Freq: Once | INTRAVENOUS | Status: AC
  Administered 2013-07-26: 30 ug/min via INTRAVENOUS
  Filled 2013-07-26: qty 1

## 2013-07-26 MED ORDER — ASPIRIN 81 MG PO CHEW
324.0000 mg | CHEWABLE_TABLET | Freq: Once | ORAL | Status: AC
  Administered 2013-07-26: 324 mg via ORAL
  Filled 2013-07-26: qty 4

## 2013-07-26 MED ORDER — LEVOFLOXACIN IN D5W 500 MG/100ML IV SOLN
500.0000 mg | Freq: Once | INTRAVENOUS | Status: AC
  Administered 2013-07-26: 500 mg via INTRAVENOUS
  Filled 2013-07-26: qty 100

## 2013-07-26 MED ORDER — PROPOFOL 10 MG/ML IV EMUL
INTRAVENOUS | Status: AC
  Filled 2013-07-26: qty 100

## 2013-07-26 MED ORDER — SODIUM CHLORIDE 0.9 % IV SOLN: 250.0000 mL | INTRAVENOUS | Status: DC | PRN

## 2013-07-26 MED ORDER — FENTANYL CITRATE 0.05 MG/ML IJ SOLN
INTRAMUSCULAR | Status: AC
  Administered 2013-07-26: 100 ug
  Filled 2013-07-26: qty 2

## 2013-07-26 MED ORDER — SUCCINYLCHOLINE CHLORIDE 20 MG/ML IJ SOLN
INTRAMUSCULAR | Status: AC
  Administered 2013-07-26: 150 mg
  Filled 2013-07-26: qty 1

## 2013-07-26 MED ORDER — METHYLPREDNISOLONE SODIUM SUCC 125 MG IJ SOLR
125.0000 mg | Freq: Once | INTRAMUSCULAR | Status: AC
  Administered 2013-07-26: 125 mg via INTRAVENOUS
  Filled 2013-07-26: qty 2

## 2013-07-26 MED ORDER — LORAZEPAM 2 MG/ML IJ SOLN
0.5000 mg | Freq: Once | INTRAMUSCULAR | Status: AC
  Administered 2013-07-26: 0.5 mg via INTRAVENOUS
  Filled 2013-07-26: qty 1

## 2013-07-26 MED ORDER — PROPOFOL 10 MG/ML IV EMUL
5.0000 ug/kg/min | Freq: Once | INTRAVENOUS | Status: AC
  Administered 2013-07-26: 50 ug/kg/min via INTRAVENOUS

## 2013-07-26 MED ORDER — SODIUM CHLORIDE 0.9 % IV SOLN
100.0000 ug/h | INTRAVENOUS | Status: DC
  Administered 2013-07-26: 100 ug/h via INTRAVENOUS
  Filled 2013-07-26: qty 50

## 2013-07-26 MED ORDER — CISATRACURIUM BOLUS VIA INFUSION
10.0000 mg | Freq: Once | INTRAVENOUS | Status: AC
  Administered 2013-07-26: 10 mg via INTRAVENOUS
  Filled 2013-07-26: qty 10

## 2013-07-26 MED ORDER — FENTANYL CITRATE 0.05 MG/ML IJ SOLN: 50.0000 ug | Freq: Once | INTRAMUSCULAR | Status: AC

## 2013-07-26 MED ORDER — LIDOCAINE HCL (CARDIAC) 20 MG/ML IV SOLN
INTRAVENOUS | Status: AC
  Filled 2013-07-26: qty 5

## 2013-07-26 MED ORDER — OSELTAMIVIR PHOSPHATE 6 MG/ML PO SUSR
75.0000 mg | Freq: Two times a day (BID) | ORAL | Status: DC
  Administered 2013-07-27 – 2013-07-29 (×7): 75 mg
  Filled 2013-07-26 (×10): qty 12.5

## 2013-07-26 MED ORDER — ALBUTEROL (5 MG/ML) CONTINUOUS INHALATION SOLN
15.0000 mg/h | INHALATION_SOLUTION | Freq: Once | RESPIRATORY_TRACT | Status: AC
  Administered 2013-07-26: 15 mg/h via RESPIRATORY_TRACT

## 2013-07-26 MED ORDER — METHYLPREDNISOLONE SODIUM SUCC 125 MG IJ SOLR
80.0000 mg | Freq: Four times a day (QID) | INTRAMUSCULAR | Status: DC
  Administered 2013-07-27 – 2013-07-31 (×19): 80 mg via INTRAVENOUS
  Filled 2013-07-26 (×22): qty 1.28

## 2013-07-26 MED ORDER — ALBUTEROL SULFATE (2.5 MG/3ML) 0.083% IN NEBU
2.5000 mg | INHALATION_SOLUTION | Freq: Four times a day (QID) | RESPIRATORY_TRACT | Status: DC
  Administered 2013-07-27 (×2): 2.5 mg via RESPIRATORY_TRACT
  Filled 2013-07-26 (×6): qty 3

## 2013-07-26 MED ORDER — PROPOFOL 10 MG/ML IV EMUL
0.0000 ug/kg/min | INTRAVENOUS | Status: DC
  Administered 2013-07-26: 20 ug/kg/min via INTRAVENOUS
  Administered 2013-07-27: 30 ug/kg/min via INTRAVENOUS
  Administered 2013-07-27: 20 ug/kg/min via INTRAVENOUS
  Administered 2013-07-27: 40 ug/kg/min via INTRAVENOUS
  Administered 2013-07-27: 50 ug/kg/min via INTRAVENOUS
  Administered 2013-07-27: 25 ug/kg/min via INTRAVENOUS
  Administered 2013-07-27: 20 ug/kg/min via INTRAVENOUS
  Administered 2013-07-28: 30 ug/kg/min via INTRAVENOUS
  Administered 2013-07-28 (×2): 20 ug/kg/min via INTRAVENOUS
  Administered 2013-07-28 (×2): 30 ug/kg/min via INTRAVENOUS
  Administered 2013-07-29: 40 ug/kg/min via INTRAVENOUS
  Administered 2013-07-29 (×2): 35 ug/kg/min via INTRAVENOUS
  Administered 2013-07-29: 40 ug/kg/min via INTRAVENOUS
  Administered 2013-07-29: 30 ug/kg/min via INTRAVENOUS
  Administered 2013-07-30 (×3): 40 ug/kg/min via INTRAVENOUS
  Filled 2013-07-26 (×19): qty 100

## 2013-07-26 MED ORDER — INSULIN ASPART 100 UNIT/ML ~~LOC~~ SOLN
2.0000 [IU] | SUBCUTANEOUS | Status: DC
  Administered 2013-07-27: 4 [IU] via SUBCUTANEOUS
  Administered 2013-07-27 (×4): 6 [IU] via SUBCUTANEOUS
  Administered 2013-07-27 – 2013-07-28 (×3): 4 [IU] via SUBCUTANEOUS
  Administered 2013-07-28: 2 [IU] via SUBCUTANEOUS
  Administered 2013-07-28 – 2013-07-29 (×7): 4 [IU] via SUBCUTANEOUS
  Administered 2013-07-29: 2 [IU] via SUBCUTANEOUS
  Administered 2013-07-29 – 2013-07-30 (×5): 4 [IU] via SUBCUTANEOUS
  Administered 2013-07-30: 6 [IU] via SUBCUTANEOUS
  Administered 2013-07-31 – 2013-08-01 (×6): 4 [IU] via SUBCUTANEOUS
  Administered 2013-08-01 (×3): 2 [IU] via SUBCUTANEOUS
  Administered 2013-08-01 (×2): 4 [IU] via SUBCUTANEOUS
  Administered 2013-08-02 (×2): 2 [IU] via SUBCUTANEOUS

## 2013-07-26 MED ORDER — FENTANYL BOLUS VIA INFUSION
25.0000 ug | INTRAVENOUS | Status: DC | PRN
  Filled 2013-07-26: qty 50

## 2013-07-26 MED ORDER — ALBUTEROL SULFATE (2.5 MG/3ML) 0.083% IN NEBU
2.5000 mg | INHALATION_SOLUTION | RESPIRATORY_TRACT | Status: DC | PRN
  Filled 2013-07-26: qty 3

## 2013-07-26 MED ORDER — SUCCINYLCHOLINE CHLORIDE 20 MG/ML IJ SOLN: 150.0000 mg | Freq: Once | INTRAMUSCULAR | Status: DC

## 2013-07-26 MED ORDER — IPRATROPIUM BROMIDE 0.02 % IN SOLN
0.5000 mg | Freq: Once | RESPIRATORY_TRACT | Status: AC
  Administered 2013-07-26: 0.5 mg via RESPIRATORY_TRACT
  Filled 2013-07-26: qty 2.5

## 2013-07-26 MED ORDER — IPRATROPIUM-ALBUTEROL 0.5-2.5 (3) MG/3ML IN SOLN: 3.0000 mL | RESPIRATORY_TRACT | Status: DC

## 2013-07-26 MED ORDER — SODIUM CHLORIDE 0.9 % IV SOLN
INTRAVENOUS | Status: DC
  Administered 2013-07-27 – 2013-07-28 (×2): via INTRAVENOUS

## 2013-07-26 MED ORDER — ETOMIDATE 2 MG/ML IV SOLN
INTRAVENOUS | Status: AC
  Filled 2013-07-26: qty 20

## 2013-07-26 MED ORDER — PROPOFOL 10 MG/ML IV EMUL
5.0000 ug/kg/min | Freq: Once | INTRAVENOUS | Status: DC
  Administered 2013-07-26: 5 ug/kg/min via INTRAVENOUS

## 2013-07-26 MED ORDER — ETOMIDATE 2 MG/ML IV SOLN
30.0000 mg | Freq: Once | INTRAVENOUS | Status: AC
  Administered 2013-07-26: 30 mg via INTRAVENOUS

## 2013-07-26 MED ORDER — PANTOPRAZOLE SODIUM 40 MG IV SOLR
40.0000 mg | Freq: Every day | INTRAVENOUS | Status: DC
  Administered 2013-07-27 – 2013-07-31 (×6): 40 mg via INTRAVENOUS
  Filled 2013-07-26 (×7): qty 40

## 2013-07-26 MED ORDER — LEVOFLOXACIN IN D5W 500 MG/100ML IV SOLN
500.0000 mg | INTRAVENOUS | Status: DC
  Administered 2013-07-27 – 2013-08-01 (×6): 500 mg via INTRAVENOUS
  Filled 2013-07-26 (×6): qty 100

## 2013-07-26 MED ORDER — ROCURONIUM BROMIDE 50 MG/5ML IV SOLN
INTRAVENOUS | Status: AC
  Filled 2013-07-26: qty 2

## 2013-07-26 MED ORDER — ARTIFICIAL TEARS OP OINT
1.0000 "application " | TOPICAL_OINTMENT | Freq: Three times a day (TID) | OPHTHALMIC | Status: DC
  Administered 2013-07-27: 1 via OPHTHALMIC
  Filled 2013-07-26: qty 3.5

## 2013-07-26 MED ORDER — SODIUM CHLORIDE 0.9 % IV SOLN
0.0000 ug/h | INTRAVENOUS | Status: DC
  Administered 2013-07-26: 50 ug/h via INTRAVENOUS
  Administered 2013-07-27 – 2013-07-28 (×3): 150 ug/h via INTRAVENOUS
  Administered 2013-07-28: 200 ug/h via INTRAVENOUS
  Administered 2013-07-29: 250 ug/h via INTRAVENOUS
  Administered 2013-07-29 – 2013-07-30 (×3): 200 ug/h via INTRAVENOUS
  Administered 2013-07-30: 250 ug/h via INTRAVENOUS
  Administered 2013-07-31 (×2): 200 ug/h via INTRAVENOUS
  Filled 2013-07-26 (×9): qty 50

## 2013-07-26 MED ORDER — HEPARIN SODIUM (PORCINE) 5000 UNIT/ML IJ SOLN
5000.0000 [IU] | Freq: Three times a day (TID) | INTRAMUSCULAR | Status: DC
  Administered 2013-07-27 – 2013-08-07 (×34): 5000 [IU] via SUBCUTANEOUS
  Filled 2013-07-26 (×37): qty 1

## 2013-07-26 MED ORDER — SODIUM CHLORIDE 0.9 % IV SOLN
3.0000 ug/kg/min | INTRAVENOUS | Status: DC
  Administered 2013-07-26: 3 ug/kg/min via INTRAVENOUS
  Filled 2013-07-26: qty 20

## 2013-07-26 NOTE — ED Notes (Signed)
Pt copd, sob cough, sats on ra 48% labored resp 36, not able to speak, tripod, pale in color.

## 2013-07-26 NOTE — ED Provider Notes (Signed)
CSN: [id number]     Arrival date & time 07/26/13  1724 History   First MD Initiated Contact with Patient 07/26/13 1746     Chief Complaint  Patient presents with  . Shortness of Breath  . Cough   (Consider location/radiation/quality/duration/timing/severity/associated sxs/prior Treatment) HPI  53 year old male brought in respiratory distress. Patient has a past history of COPD on oxygen at night. He's been increasingly short of breath over the past 2-3 weeks and is acutely worsening tomorrow the past 2-3 days. Has been putting not been evaluated is Thinking he was started at better. Today he was in the car driving with his wife to [facility] with his breathing worsened further. His hands and fingers appear blue which prompted him to come see emergency room. On arrival patient was diaphoretic. Oxygen saturation was 54% on room air. Accessory muscle usage and unable to speak. Work of breathing improved and  more alert after placed on supplemental oxygen. He denies any CP. Feels anxious. Increased frequency of his cough, but no sputum. NO hemoptysis. He was started on continuous neb. Steroids. Chest x-ray without any acute abnormality. With hx of COPD and his significantly increased work of breathing he was empirically started on Levaquin as well. His respiratory status declined throughout his emergency room stay despite being placed on BiPAP. He began gagging there is concern for airway protection he was intubated.  Past Medical History  Diagnosis Date  . COPD with emphysema        . Hyperlipidemia   . Hypertension   . Nephrolithiasis   . Hiatal hernia   . Seasonal allergies   . Pulmonary nodule, right 12/10/2010  . Nocturnal hypoxemia due to emphysema   . ACE-inhibitor cough    Past Surgical History  Procedure Laterality Date  . Knee arthroscopy  2005    right knee   Family History  Problem Relation Age of Onset  . Pancreatic cancer Father   . Heart disease Sister     History  Substance Use Topics  . Smoking status: Former Smoker -- 1.50 packs/day for 16 years    Types: Cigarettes    Quit date: 10/25/1991  . Smokeless tobacco: Never Used  . Alcohol Use: Yes     Comment: rare    Review of Systems  Level 5 caveat because pt is in severe respiratory distress and having difficulty speaking.   Allergies  Ace inhibitors; Aspirin; and Latex  Home Medications   Current Outpatient Rx  Name  Route  Sig  Dispense  Refill  . albuterol (VENTOLIN HFA) 108 (90 BASE) MCG/ACT inhaler   Inhalation   Inhale 2 puffs into the lungs every 6 (six) hours as needed.           Marland Kitchen amLODipine (NORVASC) 10 MG tablet   Oral   Take 10 mg by mouth daily.           . budesonide-formoterol (SYMBICORT) 80-4.5 MCG/ACT inhaler      1 puff twice a day         . cetirizine (ZYRTEC) 10 MG tablet   Oral   Take 10 mg by mouth daily.           . Cholecalciferol (VITAMIN D) 2000 UNITS CAPS   Oral   Take 1 capsule by mouth daily.           Marland Kitchen doxycycline (VIBRA-TABS) 100 MG tablet   Oral   Take 1 tablet by mouth as directed.         Marland Kitchen  fish oil-omega-3 fatty acids 1000 MG capsule      2 capsules by mouth once daily            . flunisolide (NASALIDE) 0.025 % SOLN   Inhalation   Inhale 2 sprays into the lungs 2 (two) times daily.           . fluocinolone (SYNALAR) 0.025 % ointment   Topical   Apply topically 2 (two) times daily.           . hydrochlorothiazide 25 MG tablet      1/2 tab by mouth once daily          . HYDROcodone-acetaminophen (VICODIN) 5-500 MG per tablet   Oral   Take 1 tablet by mouth 3 (three) times daily as needed.           Marland Kitchen losartan (COZAAR) 100 MG tablet   Oral   Take 100 mg by mouth daily.           . naproxen (NAPROSYN) 500 MG tablet      500 mg as needed.          . niacin 50 MG tablet      2 tabs by mouth at bedtime            . rosuvastatin (CRESTOR) 20 MG tablet      1/2 tab by mouth at  bedtime            . tiotropium (SPIRIVA) 18 MCG inhalation capsule   Inhalation   Place 1 capsule (18 mcg total) into inhaler and inhale daily.   30 capsule   6   . urea (CARMOL) 20 % cream   Topical   Apply topically 2 (two) times daily as needed.            BP 153/123  Pulse 118  Resp 22  SpO2 95% Physical Exam  Nursing note and vitals reviewed. Constitutional: He appears distressed.  Sitting up in bed. Diaphoretic. Significantly increased WOB.  HENT:  Head: Normocephalic and atraumatic.  Eyes: Conjunctivae are normal. Pupils are equal, round, and reactive to light. Right eye exhibits no discharge. Left eye exhibits no discharge.  Neck: Neck supple.  Cardiovascular: Regular rhythm and normal heart sounds.  Exam reveals no gallop and no friction rub.   No murmur heard. tachycardic  Pulmonary/Chest: He is in respiratory distress. He has wheezes.  Severe respiratory distress. Tachypneic. Cannot speak. Accessory muscle usage. Wheezing b/l.   Abdominal: Soft. He exhibits no distension. There is no tenderness.  Musculoskeletal: He exhibits no edema and no tenderness.  Neurological: He is alert. He exhibits normal muscle tone. Coordination normal.  Skin: Skin is warm. He is diaphoretic.    ED Course  Procedures (including critical care time)  INTUBATION Performed by: Raeford Razor  Required items: required blood products, implants, devices, and special equipment available Patient identity confirmed: provided demographic data and hospital-assigned identification number Time out: Immediately prior to procedure a "time out" was called to verify the correct patient, procedure, equipment, support staff and site/side marked as required.  Indications: respiratory failure, airway protection  Intubation method: Glidescope Laryngoscopy   Preoxygenation: BVM  Sedatives: Etomidate Paralytic: Succinylcholine  Tube Size: 8.0 cuffed  Post-procedure assessment: chest rise  and ETCO2 monitor  Breath sounds: equal and absent over the epigastrium  Tube secured with: ETT holder  Chest x-ray interpreted by radiologist and me.  Chest x-ray findings: endotracheal tube in appropriate position  Patient tolerated the  procedure well with no immediate complications.  CRITICAL CARE Performed by: Raeford Razor  Total critical care time: 60 minutes  Critical care time was exclusive of separately billable procedures and treating other patients. Critical care was necessary to treat or prevent imminent or life-threatening deterioration. Critical care was time spent personally by me on the following activities: development of treatment plan with patient and/or surrogate as well as nursing, discussions with consultants, evaluation of patient's response to treatment, examination of patient, obtaining history from patient or surrogate, ordering and performing treatments and interventions, ordering and review of laboratory studies, ordering and review of radiographic studies, pulse oximetry and re-evaluation of patient's condition.     Labs Review Labs Reviewed  CBC WITH DIFFERENTIAL - Abnormal; Notable for the following:    WBC 11.1 (*)    Monocytes Relative 19 (*)    Monocytes Absolute 2.1 (*)    All other components within normal limits  BASIC METABOLIC PANEL - Abnormal; Notable for the following:    Sodium 132 (*)    Potassium 3.3 (*)    Chloride 84 (*)    CO2 33 (*)    Glucose, Bld 189 (*)    All other components within normal limits  TROPONIN I   Imaging Review Dg Chest Port 1 View  07/28/2013   CLINICAL DATA:  Respiratory failure  EXAM: PORTABLE CHEST - 1 VIEW  COMPARISON:  07/27/2013  FINDINGS: Endotracheal tube ends in the mid thoracic trachea. Enteric tube crosses the diaphragm. Left IJ catheter at distal SVC.  Normal heart size and mediastinal contours. No edema or infiltrate. No effusion or pneumothorax.  IMPRESSION: 1. Tubes and lines remain in good  position. 2. Clear lungs.   Electronically Signed   By: Tiburcio Pea M.D.   On: 07/28/2013 06:15   Dg Chest Port 1 View  07/27/2013   CLINICAL DATA:  Left jugular central line placement. COPD. Acute on chronic respiratory failure.  EXAM: PORTABLE CHEST - 1 VIEW  COMPARISON:  07/27/2013  FINDINGS: A new right internal jugular central venous catheter is seen with tip overlying the superior cavoatrial junction. No evidence of pneumothorax.  Endotracheal tube and nasogastric tube remain in satisfactory position. Heart size is stable. No evidence of pulmonary edema or consolidation. No evidence of pleural effusion.  IMPRESSION: New right internal jugular central venous catheter tip overlies the superior cavoatrial junction. No evidence of pneumothorax or other acute findings.   Electronically Signed   By: Myles Rosenthal M.D.   On: 07/27/2013 15:40   Dg Chest Port 1 View  07/27/2013   CLINICAL DATA:  COPD exacerbation.  EXAM: PORTABLE CHEST - 1 VIEW  COMPARISON:  07/26/2013  FINDINGS: Endotracheal tube tip 6 cm above the carina.  Central pulmonary vascular prominence.  No segmental consolidation or gross pneumothorax.  Heart size within normal limits.  IMPRESSION: No significant change.   Electronically Signed   By: Bridgett Larsson M.D.   On: 07/27/2013 07:25   Dg Chest Portable 1 View  07/26/2013   CLINICAL DATA:  Endotracheal tube placement  EXAM: PORTABLE CHEST - 1 VIEW  COMPARISON:  1803 hr  FINDINGS: Endotracheal tube has been placed with tip 3.8 cm above the carinal. NG tube has also been placed, the tip of which is not identified. It appears to cross the gastroesophageal junction. Heart size and vascular pattern normal. No consolidation.  IMPRESSION: Support devices as described above.   Electronically Signed   By: Esperanza Heir M.D.   On:  07/26/2013 20:15   Dg Chest Portable 1 View  07/26/2013   CLINICAL DATA:  Cough.  Shortness of breath.  Hypoxia.  EXAM: PORTABLE CHEST - 1 VIEW  COMPARISON:  12/01/2010   FINDINGS: Pulmonary hyperinflation again seen, consistent with COPD. No evidence of pulmonary infiltrate or edema. No evidence of pleural effusion. No mass or lymphadenopathy identified. Heart size remains normal. Old bilateral rib fracture deformities again noted.  IMPRESSION: COPD.  No active disease.   Electronically Signed   By: Myles RosenthalJohn  Stahl M.D.   On: 07/26/2013 18:21    EKG Interpretation    Date/Time:  Thursday July 26 2013 17:35:59 EST Ventricular Rate:  115 PR Interval:  201 QRS Duration: 82 QT Interval:  307 QTC Calculation: 425 R Axis:   12 Text Interpretation:  Sinus tachycardia Borderline prolonged PR interval Poor R wave progression Baseline wander in lead(s) II aVR aVF V4 Confirmed by Chaniqua Brisby  MD, Tesla Bochicchio ([id number]) on 07/28/2013 7:35:59 AM            MDM   1. Acute-on-chronic respiratory failure   2. Obstructive chronic bronchitis with exacerbation   3. COPD with emphysema   4. Encephalopathy acute      7:02 PM Pt increasingly anxious. Diaphoretic. O2 sats remain above 90%. More tachypneic and increasing accessory muscle usage. Moved to resus bay and respiratory called for bipap.   7:58 PM Pt increasingly somnolent on bipap. Began gagging and concern for airway protection. Intubated.   8:13 PM Discussed with Dr Delford FieldWright, CCM. Will evaluate remotely and will transfer to ICU after.   Raeford RazorStephen Bronwen Pendergraft, MD 07/28/13 [phone]

## 2013-07-26 NOTE — ED Notes (Signed)
[facility]

## 2013-07-26 NOTE — Progress Notes (Signed)
eLink Physician-Brief Progress Note Patient Name: Robert GuntherOrlando Macdougal DOB: 14-Nov-1960 MRN: [id number]  Date of Service  07/26/2013   HPI/Events of Note   Pt with Copd exac , no PNA on CXR. Hypercarbic resp failure  eICU Interventions  Plan adm to icu Full note to follow See orders   Intervention Category Major Interventions: Respiratory failure - evaluation and management  Shan Levansatrick Arlow Spiers 07/26/2013, 8:26 PM

## 2013-07-26 NOTE — H&P (Signed)
Name: Robert [name] MRN: [id number] DOB: [birth year]    ADMISSION DATE:  07/26/2013  REFERRING MD :  ER  CHIEF COMPLAINT:  Can't breath  BRIEF PATIENT DESCRIPTION:  53 yo male Veteran, former smoker presented to ED with progressive dyspnea, cough, and hypoxia or 2 to 3 weeks.  He was tried on BiPAP, but eventually required intubation.  He has hx of GOLD 4 COPD and nocturnal hypoxia, and followed by Dr. Craige CottaSood in pulmonary office.  SIGNIFICANT EVENTS: 1/01 Respiratory distress, failed BiPAP >> intubated, transiently on pressors and paralytic  STUDIES:  PFT 09/03/10>>FEV1 1.66(42%), FEV1% 37, TLC 7.87(101%), DLCO 92%, no BD  08/07/10 Alpha 1 AT level normal with MM genotype  LINES / TUBES: ETT 1/01 >>  CULTURES: Blood 1/01 >> Sputum 1/01 >> Influenza PCR 1/01   ANTIBIOTICS: Levaquin 1/01 >> Tamiflu 1/01 >>  HISTORY OF PRESENT ILLNESS:   53 yo male [place], former smoker presented to ED with dyspnea, cough, and hypoxia requiring intubation.  He has hx of GOLD 4 COPD and nocturnal hypoxia >> last seen 06/2012.  His symptoms have been progressive over the past 2 to 3 weeks.  He was tried on BiPAP in the ED, but this did not help.  He had difficulty with vent synchrony initially after intubation, and was started on paralytic.  He also developed hypotension after intubation and was on phenylephrine.    History obtained from review of his medical record.  PAST MEDICAL HISTORY :  Past Medical History  Diagnosis Date  . COPD with emphysema        . Hyperlipidemia   . Hypertension   . Nephrolithiasis   . Hiatal hernia   . Seasonal allergies   . Pulmonary nodule, right 12/10/2010  . Nocturnal hypoxemia due to emphysema   . ACE-inhibitor cough    Past Surgical History  Procedure Laterality Date  . Knee arthroscopy  2005    right knee   Prior to Admission medications   Medication Sig Start Date End Date Taking? Authorizing Provider  albuterol (VENTOLIN HFA) 108 (90  BASE) MCG/ACT inhaler Inhale 2 puffs into the lungs every 6 (six) hours as needed.      Historical Provider, MD  amLODipine (NORVASC) 10 MG tablet Take 10 mg by mouth daily.      Historical Provider, MD  budesonide-formoterol (SYMBICORT) 80-4.5 MCG/ACT inhaler 1 puff twice a day    Historical Provider, MD  cetirizine (ZYRTEC) 10 MG tablet Take 10 mg by mouth daily.      Historical Provider, MD  Cholecalciferol (VITAMIN D) 2000 UNITS CAPS Take 1 capsule by mouth daily.      Historical Provider, MD  doxycycline (VIBRA-TABS) 100 MG tablet Take 1 tablet by mouth as directed. 04/22/12   Historical Provider, MD  fish oil-omega-3 fatty acids 1000 MG capsule 2 capsules by mouth once daily       Historical Provider, MD  flunisolide (NASALIDE) 0.025 % SOLN Inhale 2 sprays into the lungs 2 (two) times daily.      Historical Provider, MD  fluocinolone (SYNALAR) 0.025 % ointment Apply topically 2 (two) times daily.      Historical Provider, MD  hydrochlorothiazide 25 MG tablet 1/2 tab by mouth once daily     Historical Provider, MD  HYDROcodone-acetaminophen (VICODIN) 5-500 MG per tablet Take 1 tablet by mouth 3 (three) times daily as needed.      Historical Provider, MD  losartan (COZAAR) 100 MG tablet Take 100 mg by mouth  daily.      Historical Provider, MD  naproxen (NAPROSYN) 500 MG tablet 500 mg as needed.  10/26/10   Historical Provider, MD  niacin 50 MG tablet 2 tabs by mouth at bedtime       Historical Provider, MD  rosuvastatin (CRESTOR) 20 MG tablet 1/2 tab by mouth at bedtime       Historical Provider, MD  tiotropium (SPIRIVA) 18 MCG inhalation capsule Place 1 capsule (18 mcg total) into inhaler and inhale daily. 11/16/10   [name], MD  urea (CARMOL) 20 % cream Apply topically 2 (two) times daily as needed.      Historical Provider, MD   Allergies  Allergen Reactions  . Ace Inhibitors Cough  . Aspirin     REACTION: intolerance  . Latex     FAMILY HISTORY:  Family History  Problem  Relation Age of Onset  . Pancreatic cancer Father   . Heart disease Sister    SOCIAL HISTORY:  reports that he quit smoking about 21 years ago. His smoking use included Cigarettes. He has a 24 pack-year smoking history. He has never used smokeless tobacco. He reports that he drinks alcohol. He reports that he does not use illicit drugs.  REVIEW OF SYSTEMS:   Unable to obtain.  SUBJECTIVE:   VITAL SIGNS: Pulse Rate:  [102-129] 102 (01/01 2230) Resp:  [13-32] 18 (01/01 2230) BP: (76-170)/(56-123) 85/56 mmHg (01/01 2230) SpO2:  [54 %-99 %] 96 % (01/01 2230) FiO2 (%):  [50 %-100 %] 50 % (01/01 2332) Weight:  [242 lb 8.1 oz (110 kg)] 242 lb 8.1 oz (110 kg) (01/01 2007) HEMODYNAMICS:   VENTILATOR SETTINGS: Vent Mode:  [-] PRVC FiO2 (%):  [50 %-100 %] 50 % Set Rate:  [15 bmp-18 bmp] 18 bmp Vt Set:  [161 mL] 620 mL PEEP:  [5 cmH20-6 cmH20] 5 cmH20 Plateau Pressure:  [26 cmH20-37 cmH20] 26 cmH20 INTAKE / OUTPUT: Intake/Output   None     PHYSICAL EXAMINATION: General: Paralyzed Neuro:  Sedated HEENT:  ETT in place Cardiovascular:  Regular, no murmur Lungs:  Prolonged exhalation, no wheezing Abdomen:  Soft, non tender Musculoskeletal:  1+ ankle edema Skin:  No rashes  LABS:  CBC  Recent Labs Lab 07/26/13 1800  WBC 11.1*  HGB 16.4  HCT 48.0  PLT 335   Coag's No results found for this basename: APTT, INR,  in the last 168 hours BMET  Recent Labs Lab 07/26/13 1800  NA 132*  K 3.3*  CL 84*  CO2 33*  BUN 10  CREATININE 0.85  GLUCOSE 189*   Electrolytes  Recent Labs Lab 07/26/13 1800  CALCIUM 9.9   Sepsis Markers No results found for this basename: LATICACIDVEN, PROCALCITON, O2SATVEN,  in the last 168 hours ABG  Recent Labs Lab 07/26/13 2018  PHART 7.205*  PCO2ART 98.2*  PO2ART 390.0*   Liver Enzymes No results found for this basename: AST, ALT, ALKPHOS, BILITOT, ALBUMIN,  in the last 168 hours Cardiac Enzymes  Recent Labs Lab 07/26/13 1900   TROPONINI <0.30   Glucose No results found for this basename: GLUCAP,  in the last 168 hours  Imaging Dg Chest Portable 1 View  07/26/2013   CLINICAL DATA:  Endotracheal tube placement  EXAM: PORTABLE CHEST - 1 VIEW  COMPARISON:  1803 hr  FINDINGS: Endotracheal tube has been placed with tip 3.8 cm above the carinal. NG tube has also been placed, the tip of which is not identified. It appears to cross the  gastroesophageal junction. Heart size and vascular pattern normal. No consolidation.  IMPRESSION: Support devices as described above.   Electronically Signed   By: Esperanza Heir M.D.   On: 07/26/2013 20:15   Dg Chest Portable 1 View  07/26/2013   CLINICAL DATA:  Cough.  Shortness of breath.  Hypoxia.  EXAM: PORTABLE CHEST - 1 VIEW  COMPARISON:  12/01/2010  FINDINGS: Pulmonary hyperinflation again seen, consistent with COPD. No evidence of pulmonary infiltrate or edema. No evidence of pleural effusion. No mass or lymphadenopathy identified. Heart size remains normal. Old bilateral rib fracture deformities again noted.  IMPRESSION: COPD.  No active disease.   Electronically Signed   By: Myles Rosenthal M.D.   On: 07/26/2013 18:21    ASSESSMENT / PLAN:  PULMONARY A: Acute respiratory 2nd to AECOPD >> transiently needed paralytics for vent synchrony. Hx of allergic rhinitis. P:   -f/u CXR, ABG -adjust vent settings to avoid PEEPi -solumedrol -scheduled BD's -d/c nimbex 1/01  CARDIOVASCULAR A:  Hypotension after intubation  1/01. Hx of HTN, hyperlipidemia. P:  -continue IV fluids -monitor hemodynamics -wean off phenylephrine to keep SBP > 90, MAP > 65 >> defer CVL placement unless unable to wean off pressors -hold outpt amlodipine, HCTZ, cozaar, niacin, crestor, fish oil for now  RENAL A:   Hypokalemia. P:   -monitor renal fx, urine outpt -f/u and replace electrolytes as needed  GASTROINTESTINAL A:   Nutrition. Hx of hiatal hernia. P:   -NPO -protonix of SUP -tube feeds if  unable to wean from vent soon  HEMATOLOGIC A:   No acute issues. P:  -SQ heparin for DVT prevention  INFECTIOUS A:   AECOPD. P:   -levaquin, tamiflu -f/u sputum cx, influenza PCR  ENDOCRINE A:   Steroid induced hyperglycemia.   P:   -SSI  NEUROLOGIC A:   Acute encephalopathy 2nd to respiratory failure. P:   -sedation protocol while on vent >> goal RASS - 3 to -4 while on paralytic >> lighten sedation as tolerated once off paralytic and respiratory status improves  CC time 45 minutes.  [name], MD [facility] Pulmonary/Critical Care 07/26/2013, 11:45 PM Pager:  [phone] After 3pm call: [phone]

## 2013-07-26 NOTE — ED Notes (Signed)
Pt resting calmly at present; ventilator in process; ett secured in place; tel remains sinus tach 110; [facility] of decrease in bp to 109/73--states to hold fentanyl drip at this time;

## 2013-07-26 NOTE — ED Notes (Signed)
Bed: WA03 Expected date:  Expected time:  Means of arrival:  Comments: Pt still in rm, getting dressed

## 2013-07-26 NOTE — ED Notes (Signed)
Pt resting quietly at present; family at bedside; ETT in place to ventilator; tel sinus tach;

## 2013-07-26 NOTE — ED Notes (Signed)
Pharmacy states will be 5 mins on Nimbex

## 2013-07-26 NOTE — ED Notes (Signed)
Dr Juleen ChinaKohut in to prepare for intubation; respiratory Tabitha at bedside; iv patent right antecubital; second line started right hand; etomidate given for sedation and succ used for paralysis; pt tolerated well; intubated on first attempt size 8; located at 26 on tube; secured by respiratory; OG tube placed by Dr Juleen ChinaKohut; verification with co2 detector change from purple to yellow; auscultated both lung fields with diminisched bilateral; copious oral secretions suctioned with yangkauer; iv remains patent to right antecubital/right hand

## 2013-07-26 NOTE — ED Notes (Signed)
Report called to Joy at ; [facility] notified for need for transport

## 2013-07-26 NOTE — ED Notes (Signed)
Pt started on nimbex via guardrails; 10 mg bolus given drip started at 283mcg/kg

## 2013-07-26 NOTE — ED Notes (Signed)
[facility]

## 2013-07-26 NOTE — ED Notes (Signed)
[facility] initiated with Dr Shan LevansPatrick Wright; orders to up propofol to 2350mcg/kg after 50 mg propofol bolus given;

## 2013-07-26 NOTE — ED Notes (Addendum)
Nurse at 2100 updated as to patient changes since previous report; pt calm at this time; MAP 68

## 2013-07-26 NOTE — ED Notes (Signed)
Introduced self to patient and wife; pt diaphoretic; bipap intiated by respiratory; pt opens eyes and nods when spoken to; per respiratory pt more lethargic

## 2013-07-27 ENCOUNTER — Encounter (HOSPITAL_COMMUNITY): Payer: Self-pay | Admitting: *Deleted

## 2013-07-27 ENCOUNTER — Inpatient Hospital Stay (HOSPITAL_COMMUNITY): Payer: BC Managed Care – PPO

## 2013-07-27 DIAGNOSIS — J9600 Acute respiratory failure, unspecified whether with hypoxia or hypercapnia: Secondary | ICD-10-CM

## 2013-07-27 DIAGNOSIS — J438 Other emphysema: Secondary | ICD-10-CM

## 2013-07-27 DIAGNOSIS — G9340 Encephalopathy, unspecified: Secondary | ICD-10-CM

## 2013-07-27 LAB — CBC
HEMATOCRIT: 43.6 % (ref 39.0–52.0)
Hemoglobin: 14.6 g/dL (ref 13.0–17.0)
MCH: 29.5 pg (ref 26.0–34.0)
MCHC: 33.5 g/dL (ref 30.0–36.0)
MCV: 88.1 fL (ref 78.0–100.0)
PLATELETS: 376 10*3/uL (ref 150–400)
RBC: 4.95 MIL/uL (ref 4.22–5.81)
RDW: 13.7 % (ref 11.5–15.5)
WBC: 11.2 10*3/uL — AB (ref 4.0–10.5)

## 2013-07-27 LAB — BLOOD GAS, ARTERIAL
Acid-Base Excess: 5.8 mmol/L — ABNORMAL HIGH (ref 0.0–2.0)
Bicarbonate: 31.3 mEq/L — ABNORMAL HIGH (ref 20.0–24.0)
Drawn by: 24513
FIO2: 0.5 %
O2 Saturation: 98.3 %
PATIENT TEMPERATURE: 98.1
PEEP: 5 cmH2O
RATE: 14 resp/min
TCO2: 33.1 mmol/L (ref 0–100)
VT: 620 mL
pCO2 arterial: 58.4 mmHg (ref 35.0–45.0)
pH, Arterial: 7.347 — ABNORMAL LOW (ref 7.350–7.450)
pO2, Arterial: 118 mmHg — ABNORMAL HIGH (ref 80.0–100.0)

## 2013-07-27 LAB — BASIC METABOLIC PANEL
BUN: 17 mg/dL (ref 6–23)
CHLORIDE: 88 meq/L — AB (ref 96–112)
CO2: 32 mEq/L (ref 19–32)
Calcium: 8.6 mg/dL (ref 8.4–10.5)
Creatinine, Ser: 1.23 mg/dL (ref 0.50–1.35)
GFR, EST AFRICAN AMERICAN: 76 mL/min — AB (ref >90)
GFR, EST NON AFRICAN AMERICAN: 66 mL/min — AB (ref >90)
Glucose, Bld: 218 mg/dL — ABNORMAL HIGH (ref 70–99)
Potassium: 3.9 mEq/L (ref 3.7–5.3)
Sodium: 134 mEq/L — ABNORMAL LOW (ref 137–147)

## 2013-07-27 LAB — GLUCOSE, CAPILLARY
GLUCOSE-CAPILLARY: 231 mg/dL — AB (ref 70–99)
GLUCOSE-CAPILLARY: 189 mg/dL — AB (ref 70–99)
GLUCOSE-CAPILLARY: 159 mg/dL — AB (ref 70–99)
Glucose-Capillary: 219 mg/dL — ABNORMAL HIGH (ref 70–99)
Glucose-Capillary: 220 mg/dL — ABNORMAL HIGH (ref 70–99)
Glucose-Capillary: 234 mg/dL — ABNORMAL HIGH (ref 70–99)

## 2013-07-27 LAB — INFLUENZA PANEL BY PCR (TYPE A & B)
H1N1 flu by pcr: NOT DETECTED
INFLAPCR: NEGATIVE
INFLBPCR: NEGATIVE

## 2013-07-27 LAB — MRSA PCR SCREENING: MRSA BY PCR: NEGATIVE

## 2013-07-27 LAB — TRIGLYCERIDES: TRIGLYCERIDES: 124 mg/dL (ref <150)

## 2013-07-27 MED ORDER — PROPOFOL 10 MG/ML IV BOLUS
65.0000 mg | Freq: Once | INTRAVENOUS | Status: AC
  Administered 2013-07-27: 65 mg via INTRAVENOUS

## 2013-07-27 MED ORDER — PHENYLEPHRINE HCL 10 MG/ML IJ SOLN
30.0000 ug/min | INTRAVENOUS | Status: DC
  Administered 2013-07-27: 35 ug/min via INTRAVENOUS
  Administered 2013-07-27: 40 ug/min via INTRAVENOUS
  Administered 2013-07-27: 60 ug/min via INTRAVENOUS
  Administered 2013-07-27: 50 ug/min via INTRAVENOUS
  Administered 2013-07-27: 30 ug/min via INTRAVENOUS
  Administered 2013-07-27: 60 ug/min via INTRAVENOUS
  Filled 2013-07-27 (×4): qty 2

## 2013-07-27 MED ORDER — CISATRACURIUM BESYLATE 10 MG/ML IV SOLN
3.0000 ug/kg/min | INTRAVENOUS | Status: DC
  Administered 2013-07-27: 6.5 ug/kg/min via INTRAVENOUS
  Administered 2013-07-27: 3 ug/kg/min via INTRAVENOUS
  Filled 2013-07-27: qty 20

## 2013-07-27 MED ORDER — CHLORHEXIDINE GLUCONATE 0.12 % MT SOLN
15.0000 mL | Freq: Two times a day (BID) | OROMUCOSAL | Status: DC
  Administered 2013-07-27 – 2013-08-07 (×23): 15 mL via OROMUCOSAL
  Filled 2013-07-27 (×24): qty 15

## 2013-07-27 MED ORDER — INSULIN NPH (HUMAN) (ISOPHANE) 100 UNIT/ML ~~LOC~~ SUSP
8.0000 [IU] | Freq: Two times a day (BID) | SUBCUTANEOUS | Status: DC
  Administered 2013-07-27 – 2013-07-28 (×2): 8 [IU] via SUBCUTANEOUS
  Filled 2013-07-27: qty 10

## 2013-07-27 MED ORDER — ARTIFICIAL TEARS OP OINT
1.0000 | TOPICAL_OINTMENT | Freq: Three times a day (TID) | OPHTHALMIC | Status: DC
Start: 2013-07-27 — End: 2013-07-28
  Administered 2013-07-27 (×2): 1 via OPHTHALMIC

## 2013-07-27 MED ORDER — BIOTENE DRY MOUTH MT LIQD
15.0000 mL | Freq: Four times a day (QID) | OROMUCOSAL | Status: DC
  Administered 2013-07-27 – 2013-08-07 (×43): 15 mL via OROMUCOSAL

## 2013-07-27 MED ORDER — INFLUENZA VAC SPLIT QUAD 0.5 ML IM SUSP
0.5000 mL | INTRAMUSCULAR | Status: DC
  Filled 2013-07-27: qty 0.5

## 2013-07-27 MED ORDER — SUCCINYLCHOLINE CHLORIDE 20 MG/ML IJ SOLN
100.0000 mg | Freq: Once | INTRAMUSCULAR | Status: AC
  Administered 2013-07-27: 100 mg via INTRAVENOUS
  Filled 2013-07-27: qty 5

## 2013-07-27 MED ORDER — FENTANYL CITRATE 0.05 MG/ML IJ SOLN
200.0000 ug | Freq: Once | INTRAMUSCULAR | Status: AC
  Administered 2013-07-27: 200 ug via INTRAVENOUS

## 2013-07-27 MED ORDER — IPRATROPIUM-ALBUTEROL 0.5-2.5 (3) MG/3ML IN SOLN
3.0000 mL | Freq: Four times a day (QID) | RESPIRATORY_TRACT | Status: DC
  Administered 2013-07-27 – 2013-08-07 (×39): 3 mL via RESPIRATORY_TRACT
  Filled 2013-07-27 (×60): qty 3

## 2013-07-27 MED ORDER — CISATRACURIUM BOLUS VIA INFUSION
10.0000 mg | Freq: Once | INTRAVENOUS | Status: AC
  Administered 2013-07-27: 10 mg via INTRAVENOUS
  Filled 2013-07-27: qty 10

## 2013-07-27 NOTE — Progress Notes (Signed)
Name: Robert [name] MRN: [id number] DOB: 06-11-1961    ADMISSION DATE:  07/26/2013  REFERRING MD :  EDP PRIMARY SERVICE: PCCM  CHIEF COMPLAINT:  Shortness of Breath  BRIEF PATIENT DESCRIPTION: 53 yo M, history of GOLD stage 4 COPD, HTN, HL, former tobacco use, presenting with a 2-3 week history of progressive dyspnea, cough, and hypoxia.  He was tried on BiPap in ED, but was eventually intubated.  Followed by Dr. Craige Cotta in outpatient setting.   SIGNIFICANT EVENTS / STUDIES:  1/01 - presented with resp distress, likely AECOPD, failed BiPap -> intubated.  Agitation post-intubation, requiring neuromuscular blockade.  Hypotension following that, requiring phenylephrine. 1/02 - d/c'ed nimbex, weaned down propofol and phenylephrine  LINES / TUBES: ETT 1/1 >> L IJ CVL 1/2 >> Foley 1/1 >> OGT 1/1 >>  CULTURES: Blood cx 1/1 >> Resp cx 1/2 >> MRSA: Neg  ANTIBIOTICS: 1/1 Levaquin >>  Overnight events: Patient admitted overnight with AECOPD.  ABG has significantly improved this morning.  Nimbex d/c'ed today, and propofol weaned down without increased agitation.  BP's have improved, allowing [place] to wean down Phenylephrine.  CBG's somewhat elevated today.  PAST MEDICAL HISTORY :  Past Medical History  Diagnosis Date  . COPD with emphysema        . Hyperlipidemia   . Hypertension   . Nephrolithiasis   . Hiatal hernia   . Seasonal allergies   . Pulmonary nodule, right 12/10/2010  . Nocturnal hypoxemia due to emphysema   . ACE-inhibitor cough    Past Surgical History  Procedure Laterality Date  . Knee arthroscopy  2005    right knee   Prior to Admission medications   Medication Sig Start Date End Date Taking? Authorizing Provider  albuterol (PROVENTIL HFA;VENTOLIN HFA) 108 (90 BASE) MCG/ACT inhaler Inhale 2 puffs into the lungs every 6 (six) hours as needed for wheezing or shortness of breath.   Yes Historical Provider, MD  amLODipine (NORVASC) 10 MG tablet Take 5 mg by mouth  daily.   Yes Historical Provider, MD  atorvastatin (LIPITOR) 80 MG tablet Take 40 mg by mouth at bedtime.   Yes Historical Provider, MD  budesonide-formoterol (SYMBICORT) 160-4.5 MCG/ACT inhaler Inhale 2 puffs into the lungs 2 (two) times daily.   Yes Historical Provider, MD  cetirizine (ZYRTEC) 10 MG tablet Take 10 mg by mouth daily.     Yes Historical Provider, MD  Cholecalciferol (VITAMIN D3) 2000 UNITS TABS Take 2,000 Units by mouth daily.   Yes Historical Provider, MD  flunisolide (NASALIDE) 25 MCG/ACT (0.025%) SOLN Place 2 sprays into the nose daily as needed (allergies).   Yes Historical Provider, MD  hydrochlorothiazide (HYDRODIURIL) 25 MG tablet Take 12.5 mg by mouth daily.   Yes Historical Provider, MD  HYDROcodone-acetaminophen (NORCO/VICODIN) 5-325 MG per tablet Take 1 tablet by mouth 3 (three) times daily as needed for moderate pain.   Yes Historical Provider, MD  losartan (COZAAR) 100 MG tablet Take 100 mg by mouth daily.     Yes Historical Provider, MD  Omega-3 Fatty Acids (FISH OIL) 1000 MG CAPS Take 1,000 mg by mouth 2 (two) times daily.   Yes Historical Provider, MD  pantoprazole (PROTONIX) 40 MG tablet Take 40 mg by mouth 2 (two) times daily.   Yes Historical Provider, MD  tiotropium (SPIRIVA) 18 MCG inhalation capsule Place 1 capsule (18 mcg total) into inhaler and inhale daily. 11/16/10  Yes [name], MD  urea (CARMOL) 20 % cream Apply 1 application topically daily  as needed (rash).   Yes Historical Provider, MD   Allergies  Allergen Reactions  . Ace Inhibitors Cough  . Aspirin     REACTION: intolerance  . Latex Other (See Comments)    unknown    FAMILY HISTORY:  Family History  Problem Relation Age of Onset  . Pancreatic cancer Father   . Heart disease Sister    SOCIAL HISTORY:  reports that he quit smoking about 21 years ago. His smoking use included Cigarettes. He has a 24 pack-year smoking history. He has never used smokeless tobacco. He reports that he drinks  alcohol. He reports that he does not use illicit drugs.  VITAL SIGNS: Temp:  [89.8 F (32.1 C)-99.6 F (37.6 C)] 97.5 F (36.4 C) (01/02 1156) Pulse Rate:  [56-129] 59 (01/02 1500) Resp:  [2-37] 18 (01/02 1500) BP: (69-170)/(50-123) 102/66 mmHg (01/02 1500) SpO2:  [54 %-100 %] 100 % (01/02 1500) FiO2 (%):  [50 %-100 %] 50 % (01/02 1400) Weight:  [242 lb 8.1 oz (110 kg)-255 lb 4.7 oz (115.8 kg)] 255 lb 4.7 oz (115.8 kg) (01/02 0157) HEMODYNAMICS:   VENTILATOR SETTINGS: Vent Mode:  [-] PRVC FiO2 (%):  [50 %-100 %] 50 % Set Rate:  [15 bmp-18 bmp] 18 bmp Vt Set:  [620 mL] 620 mL PEEP:  [5 cmH20-6 cmH20] 5 cmH20 Plateau Pressure:  [18 cmH20-37 cmH20] 18 cmH20 INTAKE / OUTPUT: Intake/Output     01/01 0701 - 01/02 0700 01/02 0701 - 01/03 0700   I.V. (mL/kg) 1330.3 (11.5) 1115.8 (9.6)   NG/GT  30   IV Piggyback 100    Total Intake(mL/kg) 1430.3 (12.4) 1145.8 (9.9)   Urine (mL/kg/hr) 210 1390 (1.4)   Total Output 210 1390   Net +1220.3 -244.2          PHYSICAL EXAMINATION: General: lying in bed, intubated, sedated HEENT: PERRL, ETT in place  Neck: supple Lungs: mechanical breath sounds Heart: regular rate and rhythm, no murmurs, gallops, or rubs Abdomen: soft, non-tender, non-distended, normal bowel sounds Extremities: no cyanosis, clubbing, or edema Neurologic: alert & oriented X3, cranial nerves II-XII intact, strength grossly intact, sensation intact to light touch  LABS:  CBC  Recent Labs Lab 07/26/13 1800 07/27/13 0830  WBC 11.1* 11.2*  HGB 16.4 14.6  HCT 48.0 43.6  PLT 335 376   BMET  Recent Labs Lab 07/26/13 1800 07/27/13 0830  NA 132* 134*  K 3.3* 3.9  CL 84* 88*  CO2 33* 32  BUN 10 17  CREATININE 0.85 1.23  GLUCOSE 189* 218*   Electrolytes  Recent Labs Lab 07/26/13 1800 07/27/13 0830  CALCIUM 9.9 8.6   Sepsis Markers No results found for this basename: LATICACIDVEN, PROCALCITON, O2SATVEN,  in the last 168 hours ABG  Recent Labs Lab  07/26/13 2018 07/27/13 0349  PHART 7.205* 7.347*  PCO2ART 98.2* 58.4*  PO2ART 390.0* 118.0*   Liver Enzymes No results found for this basename: AST, ALT, ALKPHOS, BILITOT, ALBUMIN,  in the last 168 hours Cardiac Enzymes  Recent Labs Lab 07/26/13 1900  TROPONINI <0.30   Glucose  Recent Labs Lab 07/27/13 0016 07/27/13 0342 07/27/13 0810 07/27/13 1155  GLUCAP 234* 219* 220* 231*    Imaging Dg Chest Port 1 View  07/27/2013   CLINICAL DATA:  COPD exacerbation.  EXAM: PORTABLE CHEST - 1 VIEW  COMPARISON:  07/26/2013  FINDINGS: Endotracheal tube tip 6 cm above the carina.  Central pulmonary vascular prominence.  No segmental consolidation or gross pneumothorax.  Heart size  within normal limits.  IMPRESSION: No significant change.   Electronically Signed   By: Bridgett Larsson M.D.   On: 07/27/2013 07:25   Dg Chest Portable 1 View  07/26/2013   CLINICAL DATA:  Endotracheal tube placement  EXAM: PORTABLE CHEST - 1 VIEW  COMPARISON:  1803 hr  FINDINGS: Endotracheal tube has been placed with tip 3.8 cm above the carinal. NG tube has also been placed, the tip of which is not identified. It appears to cross the gastroesophageal junction. Heart size and vascular pattern normal. No consolidation.  IMPRESSION: Support devices as described above.   Electronically Signed   By: Esperanza Heir M.D.   On: 07/26/2013 20:15   Dg Chest Portable 1 View  07/26/2013   CLINICAL DATA:  Cough.  Shortness of breath.  Hypoxia.  EXAM: PORTABLE CHEST - 1 VIEW  COMPARISON:  12/01/2010  FINDINGS: Pulmonary hyperinflation again seen, consistent with COPD. No evidence of pulmonary infiltrate or edema. No evidence of pleural effusion. No mass or lymphadenopathy identified. Heart size remains normal. Old bilateral rib fracture deformities again noted.  IMPRESSION: COPD.  No active disease.   Electronically Signed   By: Myles Rosenthal M.D.   On: 07/26/2013 18:21     CXR: Expanded lungs, no focal opacity seen  ASSESSMENT /  PLAN:  PULMONARY A: Acute on Chronic Respiratory Failure AECOPD - CXR shows no infiltrate, and influenza negative P:   -Flu negative, will d/c droplet precautions -continue Solumedrol 80 q6 -continue duonebs q6 -continue levaquin  CARDIOVASCULAR A:  Hypotension following intubation - improved History of HTN P:  -Attempting to wean down phenylephrine, with MAP goal of > 65 -CVL placed today -hold home amlodipine, HCTZ, cozaar  RENAL A:   Acute Kidney Injury - likely due to ATN from hypotension P:   -check BMET in AM -monitor CVP qshift to assess volume status -continue NS at 75 cc/hr  GASTROINTESTINAL A:   History of Hiatal Hernia Nutrition - if pt remains intubated tomorrow, will likely start tube feeds as recommended by Nutrition P:   -protonix for SUP -appreciate nutrition recs; likely start tube feeds in AM  HEMATOLOGIC A:   No acute issues P:  -heparin for DVT ppx  INFECTIOUS A:   AECOPD - CXR clear, flu negative, resp/blood cultures pending P:   -continue levaquin -f/u sputum culture, blood culture  ENDOCRINE A:   Steroid-induced hyperglycemia P:   -continue SSI q4 hrs -add NPH 8 units BID today, can increase as needed  NEUROLOGIC A:  Acute encephalopathy secondary to respiratory failure P:   -sedation protocol  I have personally obtained a history, examined the patient, evaluated laboratory and imaging results, formulated the assessment and plan and placed orders. CRITICAL CARE: The patient is critically ill with multiple organ systems failure and requires high complexity decision making for assessment and support, frequent evaluation and titration of therapies, application of advanced monitoring technologies and extensive interpretation of multiple databases. Critical Care Time devoted to patient care services described in this note is 45 minutes.   Janalyn Harder, PGY3 Pgr. [phone]  07/27/2013, 3:38 PM

## 2013-07-27 NOTE — Procedures (Signed)
Central Venous Catheter Insertion Procedure Note  Robert [name] [id number] 07/27/2013  Procedure: Insertion of Central Venous Catheter   Indications: Drug and/or fluid administration   Procedure Details  Consent: Risks of procedure as well as the alternatives and risks of each were explained to the patient's wife. Consent for procedure obtained.   Time Out: Verified patient identification, verified procedure, site/side was marked, verified correct patient position, special equipment/implants available, medications/allergies/relevent history reviewed, required imaging and test results available. Performed   Maximum sterile technique was used including antiseptics, cap, gloves, gown, hand hygiene, mask and sheet.  Skin prep: Chlorhexidine; local anesthetic administered  An antimicrobial bonded/coated triple lumen catheter was placed in the Left IJ vein, and sutured in place, with overlying bandage placed.  Evaluation  Blood flow good  Complications: No apparent complications  Patient did tolerate procedure well.  Chest X-ray ordered to verify placement. CXR: pending.  Signed, Janalyn Harderyan Brown, MD PGY-3, Internal Medicine Pgr: [phone] 07/27/2013, 3:11 PM  Attending:  I assisted with the procedure.  Yolonda KidaMCQUAID, Corion Sherrod [place] PCCM Pager: [phone] Cell: [phone] If no response, call [phone]

## 2013-07-27 NOTE — Procedures (Signed)
Intubation Procedure Note Robert [name] [id number] [birth year]  Procedure: Intubation Indications: ETT needed to be changed, Loss of Volumes  Procedure Details Consent: Unable to obtain consent because of emergent medical necessity. Time Out: Verified patient identification, verified procedure, site/side was marked, verified correct patient position, special equipment/implants available, medications/allergies/relevent history reviewed, required imaging and test results available.  Performed  Maximum sterile technique was used including hand hygiene and mask.   Tube exchange performed after administering 200 mcg Fentanyl, 65 mg Propofol, and 100 mcg of succinyl choline. Exchange performed over bougie. 8 F ETT secured at 25 cm at lips. Good color change, breath sounds. Patient tolerated procedure well.  Evaluation Hemodynamic Status: BP stable throughout; O2 sats: stable throughout Patient's Current Condition: stable Complications: No apparent complications Patient did tolerate procedure well. Chest X-ray ordered to verify placement.  CXR: pending.   Robert [name] R. 07/27/2013

## 2013-07-27 NOTE — Progress Notes (Signed)
Rate changed to 14 per vent orders.

## 2013-07-27 NOTE — Progress Notes (Signed)
Utilization review completed. Jazzmin Newbold, RN, BSN. 

## 2013-07-27 NOTE — Progress Notes (Signed)
INITIAL NUTRITION ASSESSMENT  DOCUMENTATION CODES Per approved criteria  -Obesity Unspecified   INTERVENTION:  If pt remains intubated recommend  Vital High Protein @ 30 ml/hr  60 ml Prostat TID  MVI daily  TF regimen would provide: 1320 kcal, 153 grams protein, and 601 ml H2O.   TF and Propofol would provide 1668 total kcal (23 kcal/kg IBW)  NUTRITION DIAGNOSIS: Inadequate oral intake related to inability to eat as evidenced by NPO status.  Goal: Enteral nutrition to provide 60-70% of estimated calorie needs (22-25 kcals/kg ideal body weight) and 100% of estimated protein needs, based on ASPEN guidelines for permissive underfeeding in critically ill obese individuals.  Monitor:  Vent status, TF initation and tolerance, weight trend, labs   Reason for Assessment: Ventilator   53 y.o. male  Admitting Dx: Acute-on-chronic respiratory failure  ASSESSMENT: Pt with progressive dyspnea, cough, and hypoxia or 2 to 3 weeks. He was tried on BiPAP, but eventually required intubation. He has hx of GOLD 4 COPD and nocturnal hypoxia, and followed by Dr. Craige Cotta in pulmonary office. Patient is currently intubated on ventilator support.  MV: 8.3 L/min Temp (24hrs), Avg:97.8 F (36.6 C), Min:89.8 F (32.1 C), Max:99.6 F (37.6 C)  Propofol: 13.2 ml/hr providing 348 kcal per day from lipid  Height: Ht Readings from Last 1 Encounters:  07/26/13 5\' 9"  (1.753 m)    Weight: Wt Readings from Last 1 Encounters:  07/27/13 255 lb 4.7 oz (115.8 kg)    Ideal Body Weight: 72.7 kg   % Ideal Body Weight: 159%  Wt Readings from Last 10 Encounters:  07/27/13 255 lb 4.7 oz (115.8 kg)  07/21/12 260 lb (117.935 kg)  04/27/12 255 lb (115.667 kg)  01/11/12 261 lb 6.4 oz (118.57 kg)  06/28/11 243 lb 12.8 oz (110.587 kg)  11/16/10 232 lb 3.2 oz (105.325 kg)  09/04/10 220 lb (99.791 kg)  08/07/10 218 lb (98.884 kg)    Usual Body Weight: 255-260 lb   % Usual Body Weight: 100%  BMI:  Body  mass index is 37.68 kg/(m^2).  Estimated Nutritional Needs: Kcal: 2079 Protein: >/= 145 grams Fluid: > 2 L/day  Skin: no issues noted  Diet Order: NPO  EDUCATION NEEDS: -No education needs identified at this time   Intake/Output Summary (Last 24 hours) at 07/27/13 1404 Last data filed at 07/27/13 1300  Gross per 24 hour  Intake 2576.11 ml  Output   1600 ml  Net 976.11 ml    Last BM: PTA   Labs:   Recent Labs Lab 07/26/13 1800 07/27/13 0830  NA 132* 134*  K 3.3* 3.9  CL 84* 88*  CO2 33* 32  BUN 10 17  CREATININE 0.85 1.23  CALCIUM 9.9 8.6  GLUCOSE 189* 218*    CBG (last 3)   Recent Labs  07/27/13 0342 07/27/13 0810 07/27/13 1155  GLUCAP 219* 220* 231*    Scheduled Meds: . antiseptic oral rinse  15 mL Mouth Rinse QID  . artificial tears  1 application Both Eyes Q8H  . chlorhexidine  15 mL Mouth Rinse BID  . heparin  5,000 Units Subcutaneous Q8H  . [START ON 07/28/2013] influenza vac split quadrivalent PF  0.5 mL Intramuscular Tomorrow-1000  . insulin aspart  2-6 Units Subcutaneous Q4H  . ipratropium-albuterol  3 mL Nebulization Q6H  . levofloxacin (LEVAQUIN) IV  500 mg Intravenous Q24H  . methylPREDNISolone (SOLU-MEDROL) injection  80 mg Intravenous Q6H  . oseltamivir  75 mg Per Tube BID  . pantoprazole (  PROTONIX) IV  40 mg Intravenous QHS  . succinylcholine  150 mg Intravenous Once    Continuous Infusions: . sodium chloride 75 mL/hr at 07/27/13 0023  . fentaNYL infusion INTRAVENOUS 150 mcg/hr (07/27/13 1121)  . phenylephrine (NEO-SYNEPHRINE) Adult infusion 35 mcg/min (07/27/13 1245)  . propofol 20 mcg/kg/min (07/27/13 1245)    Past Medical History  Diagnosis Date  . COPD with emphysema        . Hyperlipidemia   . Hypertension   . Nephrolithiasis   . Hiatal hernia   . Seasonal allergies   . Pulmonary nodule, right 12/10/2010  . Nocturnal hypoxemia due to emphysema   . ACE-inhibitor cough     Past Surgical History  Procedure  Laterality Date  . Knee arthroscopy  2005    right knee    Kendell BaneHeather Kamera Dubas RD, LDN, CNSC [phone] Pager [phone] After Hours Pager

## 2013-07-27 NOTE — Progress Notes (Signed)
Patient EtTube exchanged because Cuff was blown on other EtTube. Tolerated well. bougie used times one attempt. + EtCO2 and BBS , chest xray pending. Same size tube placed.

## 2013-07-28 ENCOUNTER — Inpatient Hospital Stay (HOSPITAL_COMMUNITY): Payer: BC Managed Care – PPO

## 2013-07-28 DIAGNOSIS — J9600 Acute respiratory failure, unspecified whether with hypoxia or hypercapnia: Secondary | ICD-10-CM

## 2013-07-28 LAB — GLUCOSE, CAPILLARY
GLUCOSE-CAPILLARY: 177 mg/dL — AB (ref 70–99)
GLUCOSE-CAPILLARY: 158 mg/dL — AB (ref 70–99)
Glucose-Capillary: 137 mg/dL — ABNORMAL HIGH (ref 70–99)
Glucose-Capillary: 166 mg/dL — ABNORMAL HIGH (ref 70–99)
Glucose-Capillary: 175 mg/dL — ABNORMAL HIGH (ref 70–99)
Glucose-Capillary: 196 mg/dL — ABNORMAL HIGH (ref 70–99)

## 2013-07-28 LAB — BASIC METABOLIC PANEL
BUN: 19 mg/dL (ref 6–23)
CO2: 31 meq/L (ref 19–32)
CREATININE: 0.93 mg/dL (ref 0.50–1.35)
Calcium: 8.6 mg/dL (ref 8.4–10.5)
Chloride: 96 mEq/L (ref 96–112)
GFR calc non Af Amer: >90 mL/min (ref >90)
Glucose, Bld: 170 mg/dL — ABNORMAL HIGH (ref 70–99)
POTASSIUM: 4.1 meq/L (ref 3.7–5.3)
SODIUM: 137 meq/L (ref 137–147)

## 2013-07-28 MED ORDER — ADULT MULTIVITAMIN LIQUID CH
5.0000 mL | Freq: Every day | ORAL | Status: DC
  Administered 2013-07-28 – 2013-07-30 (×3): 5 mL via ORAL
  Filled 2013-07-28 (×5): qty 5

## 2013-07-28 MED ORDER — ETOMIDATE 2 MG/ML IV SOLN
20.0000 mg | Freq: Once | INTRAVENOUS | Status: AC
  Administered 2013-07-28: 40 mg via INTRAVENOUS

## 2013-07-28 MED ORDER — INSULIN GLARGINE 100 UNIT/ML ~~LOC~~ SOLN
25.0000 [IU] | Freq: Every day | SUBCUTANEOUS | Status: DC
  Administered 2013-07-28 – 2013-08-01 (×5): 25 [IU] via SUBCUTANEOUS
  Filled 2013-07-28 (×7): qty 0.25

## 2013-07-28 MED ORDER — PRO-STAT SUGAR FREE PO LIQD
60.0000 mL | Freq: Three times a day (TID) | ORAL | Status: DC
  Administered 2013-07-28 – 2013-07-30 (×9): 60 mL via ORAL
  Filled 2013-07-28 (×12): qty 60

## 2013-07-28 MED ORDER — VITAL HIGH PROTEIN PO LIQD
1000.0000 mL | ORAL | Status: DC
  Administered 2013-07-28 – 2013-07-29 (×2): 1000 mL
  Administered 2013-07-30: 16:00:00
  Administered 2013-07-30: 1000 mL
  Administered 2013-07-30: 17:00:00
  Filled 2013-07-28 (×5): qty 1000

## 2013-07-28 NOTE — Progress Notes (Signed)
Attempted to wean pt, pt desat to mid 80's%, RN and MD aware.  RN resedated pt, and pt was placed back on full vent support.  Pt w/ copious amt of yellow secretions.

## 2013-07-28 NOTE — Progress Notes (Signed)
[facility] P[facility]   Name: Robert [name] MRN: [id number] DOB: [birth year]    ADMISSION DATE:  07/26/2013  REFERRING MD :  EDP PRIMARY SERVICE: PCCM  CHIEF COMPLAINT:  Shortness of Breath  BRIEF PATIENT DESCRIPTION: 53 yo M, history of GOLD stage 4 COPD, HTN, HL, former tobacco use, presenting with a 2-3 week history of progressive dyspnea, cough, and hypoxia.  He was tried on BiPap in ED, but was eventually intubated.  Followed by Dr. Craige CottaSood in outpatient setting.   SIGNIFICANT EVENTS / STUDIES:  1/01 - presented with resp distress, likely AECOPD, failed BiPap -> intubated.  Agitation post-intubation, requiring neuromuscular blockade.  Hypotension following that, requiring phenylephrine. 1/02 - d/c'ed nimbex, weaned down propofol and phenylephrine  LINES / TUBES: ETT 1/1 >> L IJ CVL 1/2 >> Foley 1/1 >> OGT 1/1 >>  CULTURES: Blood cx 1/1 >> Resp cx 1/2 >> MRSA: Neg  ANTIBIOTICS: 1/1 Levaquin >>  SUBJECTIVE: Tube exchanged overnight for blown cuff, failed SBT due to hypoxemia this morning, thick secretions  VITAL SIGNS: Temp:  [97.5 F (36.4 C)-98.2 F (36.8 C)] 97.7 F (36.5 C) (01/03 0745) Pulse Rate:  [45-101] 101 (01/03 0817) Resp:  [12-21] 14 (01/03 0800) BP: (93-121)/(56-90) 121/74 mmHg (01/03 0800) SpO2:  [93 %-100 %] 93 % (01/03 0817) FiO2 (%):  [40 %-50 %] 40 % (01/03 0817) Weight:  [115.7 kg (255 lb 1.2 oz)] 115.7 kg (255 lb 1.2 oz) (01/03 0414) HEMODYNAMICS: CVP:  [10 mmHg-14 mmHg] 10 mmHg VENTILATOR SETTINGS: Vent Mode:  [-] PRVC FiO2 (%):  [40 %-50 %] 40 % Set Rate:  [14 bmp-18 bmp] 14 bmp Vt Set:  [620 mL] 620 mL PEEP:  [5 cmH20] 5 cmH20 Pressure Support:  [5 cmH20] 5 cmH20 Plateau Pressure:  [18 cmH20-26 cmH20] 26 cmH20 INTAKE / OUTPUT: Intake/Output     01/02 0701 - 01/03 0700 01/03 0701 - 01/04 0700   I.V. (mL/kg) 3397.4 (29.4)    NG/GT 130    IV Piggyback 100    Total Intake(mL/kg) 3627.4 (31.4)    Urine (mL/kg/hr) 2210 (0.8) 145 (0.7)   Total Output  2210 145   Net +1417.4 -145          PHYSICAL EXAMINATION: General: sedated on vent, full support from vent HEENT: ETT in place, NCAT PULM: Wheezing bilaterally CV: RRR, no mgr AB: BS+, soft, nontender Ext: trace ankle edema, warm Neuro: sedated on vent, nods head to questions  LABS:  CBC  Recent Labs Lab 07/26/13 1800 07/27/13 0830  WBC 11.1* 11.2*  HGB 16.4 14.6  HCT 48.0 43.6  PLT 335 376   BMET  Recent Labs Lab 07/26/13 1800 07/27/13 0830 07/28/13 0345  NA 132* 134* 137  K 3.3* 3.9 4.1  CL 84* 88* 96  CO2 33* 32 31  BUN 10 17 19   CREATININE 0.85 1.23 0.93  GLUCOSE 189* 218* 170*   Electrolytes  Recent Labs Lab 07/26/13 1800 07/27/13 0830 07/28/13 0345  CALCIUM 9.9 8.6 8.6   Sepsis Markers No results found for this basename: LATICACIDVEN, PROCALCITON, O2SATVEN,  in the last 168 hours ABG  Recent Labs Lab 07/26/13 2018 07/27/13 0349  PHART 7.205* 7.347*  PCO2ART 98.2* 58.4*  PO2ART 390.0* 118.0*   Liver Enzymes No results found for this basename: AST, ALT, ALKPHOS, BILITOT, ALBUMIN,  in the last 168 hours Cardiac Enzymes  Recent Labs Lab 07/26/13 1900  TROPONINI <0.30   Glucose  Recent Labs Lab 07/27/13 1155 07/27/13 1608 07/27/13 2017 07/28/13 0022 07/28/13  0357 07/28/13 0722  GLUCAP 231* 189* 159* 196* 175* 166*    Imaging   CXR: Expanded lungs, no focal opacity seen  ASSESSMENT / PLAN:  PULMONARY A: Acute on Chronic Respiratory Failure AECOPD - still wheezing, thick secretions today, failed SBT P:   -full vent support -continue Solumedrol 80 q6 -continue duonebs q6 -continue levaquin  CARDIOVASCULAR A:  Hypotension following intubation - improved History of HTN P:  -tele -hold home amlodipine, HCTZ, cozaar  RENAL A:   Acute Kidney Injury - resolved P:   -check BMET in AM -monitor CVP qshift to assess volume status -KVO fluids  GASTROINTESTINAL A:   History of Hiatal Hernia Nutrition - P:    -protonix for SUP -appreciate nutrition recs; start tube feeds   HEMATOLOGIC A:   No acute issues P:  -heparin for DVT ppx  INFECTIOUS A:   AECOPD - CXR clear, flu SWAB negative, resp/blood cultures pending P:   -continue levaquin -f/u sputum culture, blood culture -send resp viral panel, put back on droplet for now  ENDOCRINE A:   Steroid-induced hyperglycemia > improved P:   -continue SSI q4 hrs -d/c NPH -start Lantus 25 daily  NEUROLOGIC A:  Acute encephalopathy secondary to respiratory failure P:   -sedation protocol  I have personally obtained a history, examined the patient, evaluated laboratory and imaging results, formulated the assessment and plan and placed orders. CRITICAL CARE: The patient is critically ill with multiple organ systems failure and requires high complexity decision making for assessment and support, frequent evaluation and titration of therapies, application of advanced monitoring technologies and extensive interpretation of multiple databases. Critical Care Time devoted to patient care services described in this note is 45 minutes.    Yolonda Kida PCCM Pager: [phone] Cell: [phone] If no response, call [phone]

## 2013-07-28 NOTE — Procedures (Signed)
Intubation Procedure Note Robert [name] [id number] Jul 22, 1961  Procedure: Intubation Indications: Respiratory insufficiency  Procedure Details Consent: Unable to obtain consent because of emergent medical necessity. Time Out: Verified patient identification, verified procedure, site/side was marked, verified correct patient position, special equipment/implants available, medications/allergies/relevent history reviewed, required imaging and test results available.  Timeout not performed due to emergent nature and patient has altered mental status  Glidescope size 4   Evaluation Hemodynamic Status: BP stable throughout; O2 sats: stable throughout Patient's Current Condition: stable Complications: No apparent complications Patient did tolerate procedure well. Chest X-ray ordered to verify placement.  CXR: pending.   Robert [name] 07/28/2013

## 2013-07-28 NOTE — Progress Notes (Signed)
Attending:  I have seen and examined the patient with nurse practitioner/resident and agree with the note above.   Family updated  CC time 35 minutes  Yolonda KidaMCQUAID, DOUGLAS [place] PCCM Pager: [phone] Cell: [phone] If no response, call [phone]

## 2013-07-28 NOTE — Progress Notes (Signed)
Rt called tompts room due to self extubation.  MD replaced ETT with no event.  Pt was placed back on original vent settings and increase of o2 to 50% due to desaturation at this time.

## 2013-07-29 ENCOUNTER — Inpatient Hospital Stay (HOSPITAL_COMMUNITY): Payer: BC Managed Care – PPO

## 2013-07-29 LAB — BASIC METABOLIC PANEL
BUN: 31 mg/dL — ABNORMAL HIGH (ref 6–23)
CO2: 34 mEq/L — ABNORMAL HIGH (ref 19–32)
Calcium: 8.3 mg/dL — ABNORMAL LOW (ref 8.4–10.5)
Chloride: 97 mEq/L (ref 96–112)
Creatinine, Ser: 0.86 mg/dL (ref 0.50–1.35)
Glucose, Bld: 171 mg/dL — ABNORMAL HIGH (ref 70–99)
POTASSIUM: 4.2 meq/L (ref 3.7–5.3)
SODIUM: 139 meq/L (ref 137–147)

## 2013-07-29 LAB — CULTURE, RESPIRATORY W GRAM STAIN

## 2013-07-29 LAB — CBC
HCT: 42.6 % (ref 39.0–52.0)
HEMOGLOBIN: 13.7 g/dL (ref 13.0–17.0)
MCH: 29.8 pg (ref 26.0–34.0)
MCHC: 32.2 g/dL (ref 30.0–36.0)
MCV: 92.6 fL (ref 78.0–100.0)
Platelets: 351 10*3/uL (ref 150–400)
RBC: 4.6 MIL/uL (ref 4.22–5.81)
RDW: 14.2 % (ref 11.5–15.5)
WBC: 14 10*3/uL — ABNORMAL HIGH (ref 4.0–10.5)

## 2013-07-29 LAB — TRIGLYCERIDES: TRIGLYCERIDES: 320 mg/dL — AB (ref <150)

## 2013-07-29 LAB — GLUCOSE, CAPILLARY
GLUCOSE-CAPILLARY: 171 mg/dL — AB (ref 70–99)
GLUCOSE-CAPILLARY: 172 mg/dL — AB (ref 70–99)
Glucose-Capillary: 143 mg/dL — ABNORMAL HIGH (ref 70–99)
Glucose-Capillary: 157 mg/dL — ABNORMAL HIGH (ref 70–99)
Glucose-Capillary: 184 mg/dL — ABNORMAL HIGH (ref 70–99)
Glucose-Capillary: 192 mg/dL — ABNORMAL HIGH (ref 70–99)

## 2013-07-29 LAB — CULTURE, RESPIRATORY

## 2013-07-29 NOTE — Progress Notes (Signed)
[facility]   Name: Robert [name] MRN: [id number] DOB: 10/10/1960    ADMISSION DATE:  07/26/2013  REFERRING MD :  EDP PRIMARY SERVICE: PCCM  CHIEF COMPLAINT:  Shortness of Breath  BRIEF PATIENT DESCRIPTION: 53 yo M, history of GOLD stage 4 COPD, HTN, HL, former tobacco use, presenting with a 2-3 week history of progressive dyspnea, cough, and hypoxia.  He was tried on BiPap in ED, but was eventually intubated.  Followed by Dr. Craige Cotta in outpatient setting.   SIGNIFICANT EVENTS / STUDIES:  1/01 - presented with resp distress, likely AECOPD, failed BiPap -> intubated.  Agitation post-intubation, requiring neuromuscular blockade.  Hypotension following that, requiring phenylephrine. 1/02 - d/c'ed nimbex, weaned down propofol and phenylephrine 1/03 - off phenylephrine, self-extubated then re-intubated w/o complication, soft restraints ordered  LINES / TUBES: ETT 1/1 >> 1/3 self extub/reint >> L IJ CVL 1/2 >> Foley 1/1 >> OGT 1/1 >>  CULTURES: Blood cx 1/1 >> Resp cx 1/2 >> MRSA: Neg  ANTIBIOTICS: 1/1 Levaquin >>  SUBJECTIVE: self-extubated overnight, on fentanyl and propofol currently  VITAL SIGNS: Temp:  [98 F (36.7 C)-98.6 F (37 C)] 98.6 F (37 C) (01/04 0734) Pulse Rate:  [54-123] 67 (01/04 0828) Resp:  [0-33] 14 (01/04 0828) BP: (95-139)/(59-102) 123/94 mmHg (01/04 0828) SpO2:  [91 %-100 %] 99 % (01/04 0828) FiO2 (%):  [40 %-100 %] 40 % (01/04 0828) Weight:  [261 lb 7.5 oz (118.6 kg)] 261 lb 7.5 oz (118.6 kg) (01/04 0200) HEMODYNAMICS: CVP:  [12 mmHg-13 mmHg] 13 mmHg VENTILATOR SETTINGS: Vent Mode:  [-] PRVC FiO2 (%):  [40 %-100 %] 40 % Set Rate:  [14 bmp] 14 bmp Vt Set:  [620 mL] 620 mL PEEP:  [5 cmH20] 5 cmH20 Plateau Pressure:  [23 cmH20-48 cmH20] 23 cmH20 INTAKE / OUTPUT: Intake/Output     01/03 0701 - 01/04 0700 01/04 0701 - 01/05 0700   I.V. (mL/kg) 1664.4 (14)    NG/GT 1012.5 30   IV Piggyback 100    Total Intake(mL/kg) 2776.9 (23.4) 30 (0.3)   Urine  (mL/kg/hr) 1475 (0.5)    Total Output 1475     Net +1301.9 +30          PHYSICAL EXAMINATION: General: sedated on vent, full support from vent HEENT: ETT in place, NCAT, opens eyes to verbal stimuli PULM: mild Wheezing bilaterally R>L CV: RRR, no mgr appreciated AB: BS+, soft, nontender Ext: trace LE edema, warm, distal pulses intact, in soft restraints Neuro: sedated on vent, awakens to name though easily agitated and started chewing on ETT on exam  LABS:  CBC  Recent Labs Lab 07/26/13 1800 07/27/13 0830 07/29/13 0440  WBC 11.1* 11.2* 14.0*  HGB 16.4 14.6 13.7  HCT 48.0 43.6 42.6  PLT 335 376 351   BMET  Recent Labs Lab 07/27/13 0830 07/28/13 0345 07/29/13 0440  NA 134* 137 139  K 3.9 4.1 4.2  CL 88* 96 97  CO2 32 31 34*  BUN 17 19 31*  CREATININE 1.23 0.93 0.86  GLUCOSE 218* 170* 171*   Electrolytes  Recent Labs Lab 07/27/13 0830 07/28/13 0345 07/29/13 0440  CALCIUM 8.6 8.6 8.3*   ABG  Recent Labs Lab 07/26/13 2018 07/27/13 0349  PHART 7.205* 7.347*  PCO2ART 98.2* 58.4*  PO2ART 390.0* 118.0*   Cardiac Enzymes  Recent Labs Lab 07/26/13 1900  TROPONINI <0.30   Glucose  Recent Labs Lab 07/28/13 1128 07/28/13 1511 07/28/13 1941 07/28/13 2351 07/29/13 0408 07/29/13 0721  GLUCAP 177*  158* 137* 157* 171* 172*    Imaging   1/4 CXR: no focal opacity   ASSESSMENT / PLAN:  PULMONARY A: Acute on Chronic Respiratory Failure AECOPD - mild wheezing bilateral lung fields P:   -cont full vent support -continue Solumedrol 80 q6 -continue duonebs q6 -continue levaquin  CARDIOVASCULAR A:  Hypotension following intubation - improved History of HTN, now normotensive 123/94 P:  -tele -cont hold home amlodipine, HCTZ, cozaar  RENAL A:   Acute Kidney Injury - resolved P:   -cont CVP qshift to assess volume status, currently 12-13 mmHg -KVO fluids  Recent Labs Lab 07/26/13 1800 07/27/13 0830 07/28/13 0345 07/29/13 0440   CREATININE 0.85 1.23 0.93 0.86     GASTROINTESTINAL A:   History of Hiatal Hernia Nutrition - P:   -protonix for SUP -appreciate nutrition recs; cont tube feeds   HEMATOLOGIC A:   No acute issues P:  -heparin for DVT ppx  INFECTIOUS A:   AECOPD - CXR clear, flu SWAB negative, resp/blood cultures ngtd, bump in leukocytosis likely secondary to steroids P:   -continue levaquin -sputum culture no organisms, blood culture ngtd -resp viral panel results pending, on on droplet for now -received oseltamiivir x 2 days will continue until resp viral panel results  Recent Labs Lab 07/26/13 1800 07/27/13 0830 07/29/13 0440  WBC 11.1* 11.2* 14.0*    ENDOCRINE A:   Steroid-induced hyperglycemia > improved P:   -continue SSI q4 hrs -cont Lantus 25 daily -cont Solu-Medrol 80 mg q8h with plan to decrease to 80 mg q12 tomorrow CBG (last 3)   Recent Labs  07/28/13 2351 07/29/13 0408 07/29/13 0721  GLUCAP 157* 171* 172*    NEUROLOGIC A:  Acute encephalopathy secondary to respiratory failure P:   -cont sedation protocol    Kristie CowmanSCHOOLER, KAREN, MD Internal Medicine Resident PGY3  Attending:  I have seen and examined the patient with nurse practitioner/resident and agree with the note above.   Self extubated last night and did not do well so reintubated.  Will need 1-2 more days of steroids/abx on vent I suspect.  Yolonda KidaMCQUAID, DOUGLAS [place] PCCM Pager: [phone] Cell: [phone] If no response, call [phone]

## 2013-07-29 NOTE — Progress Notes (Signed)
Patient coughing causing vent to alarm, upon entering room patient coughing attempted to suction ETT. Patient became extremely agitated pulling at ETT. Attempted to hold patient's hands down, patient pulled ett out in his mouth. Patient bagged with 100% FiO2. Patient re-intubated per MD. Restraints put in place per verbal order. Will continue to monitor closely  Dustin Flockhris Kayleen Alig RN

## 2013-07-30 ENCOUNTER — Inpatient Hospital Stay (HOSPITAL_COMMUNITY): Payer: BC Managed Care – PPO

## 2013-07-30 LAB — BASIC METABOLIC PANEL
BUN: 31 mg/dL — ABNORMAL HIGH (ref 6–23)
CO2: 34 meq/L — AB (ref 19–32)
Calcium: 8.6 mg/dL (ref 8.4–10.5)
Chloride: 97 mEq/L (ref 96–112)
Creatinine, Ser: 0.83 mg/dL (ref 0.50–1.35)
GFR calc Af Amer: >90 mL/min (ref >90)
GFR calc non Af Amer: >90 mL/min (ref >90)
Glucose, Bld: 181 mg/dL — ABNORMAL HIGH (ref 70–99)
POTASSIUM: 5 meq/L (ref 3.7–5.3)
SODIUM: 142 meq/L (ref 137–147)

## 2013-07-30 LAB — GLUCOSE, CAPILLARY
GLUCOSE-CAPILLARY: 170 mg/dL — AB (ref 70–99)
GLUCOSE-CAPILLARY: 179 mg/dL — AB (ref 70–99)
GLUCOSE-CAPILLARY: 177 mg/dL — AB (ref 70–99)
Glucose-Capillary: 214 mg/dL — ABNORMAL HIGH (ref 70–99)
Glucose-Capillary: 172 mg/dL — ABNORMAL HIGH (ref 70–99)
Glucose-Capillary: 177 mg/dL — ABNORMAL HIGH (ref 70–99)

## 2013-07-30 LAB — POCT I-STAT 3, ART BLOOD GAS (G3+)
Acid-Base Excess: 11 mmol/L — ABNORMAL HIGH (ref 0.0–2.0)
BICARBONATE: 41 meq/L — AB (ref 20.0–24.0)
O2 Saturation: 91 %
PH ART: 7.336 — AB (ref 7.350–7.450)
PO2 ART: 67 mmHg — AB (ref 80.0–100.0)
TCO2: 43 mmol/L (ref 0–100)
pCO2 arterial: 76.5 mmHg (ref 35.0–45.0)

## 2013-07-30 LAB — CBC
HCT: 45.3 % (ref 39.0–52.0)
Hemoglobin: 14.4 g/dL (ref 13.0–17.0)
MCH: 30.3 pg (ref 26.0–34.0)
MCHC: 31.8 g/dL (ref 30.0–36.0)
MCV: 95.2 fL (ref 78.0–100.0)
PLATELETS: 343 10*3/uL (ref 150–400)
RBC: 4.76 MIL/uL (ref 4.22–5.81)
RDW: 14.3 % (ref 11.5–15.5)
WBC: 14.7 10*3/uL — ABNORMAL HIGH (ref 4.0–10.5)

## 2013-07-30 LAB — RESPIRATORY VIRUS PANEL
Adenovirus: NOT DETECTED
INFLUENZA A H3: NOT DETECTED
INFLUENZA B 1: NOT DETECTED
Influenza A H1: NOT DETECTED
Influenza A: NOT DETECTED
Metapneumovirus: NOT DETECTED
PARAINFLUENZA 1 A: NOT DETECTED
Parainfluenza 2: NOT DETECTED
Parainfluenza 3: NOT DETECTED
RESPIRATORY SYNCYTIAL VIRUS B: NOT DETECTED
Respiratory Syncytial Virus A: NOT DETECTED
Rhinovirus: NOT DETECTED

## 2013-07-30 MED ORDER — PROPOFOL 10 MG/ML IV EMUL
INTRAVENOUS | Status: AC
  Administered 2013-07-30: 22:00:00 40 ug/kg/min via INTRAVENOUS
  Filled 2013-07-30: qty 100

## 2013-07-30 MED ORDER — BISACODYL 10 MG RE SUPP: 10.0000 mg | Freq: Every day | RECTAL | Status: DC | PRN

## 2013-07-30 MED ORDER — PROPOFOL 10 MG/ML IV EMUL
5.0000 ug/kg/min | INTRAVENOUS | Status: DC
  Administered 2013-07-30: 40 ug/kg/min via INTRAVENOUS
  Administered 2013-07-30: 50 ug/kg/min via INTRAVENOUS
  Administered 2013-07-31 (×2): 60 ug/kg/min via INTRAVENOUS
  Filled 2013-07-30 (×4): qty 100

## 2013-07-30 MED ORDER — MIDAZOLAM HCL 2 MG/2ML IJ SOLN
4.0000 mg | Freq: Once | INTRAMUSCULAR | Status: AC
Start: 2013-07-30 — End: 2013-07-30
  Administered 2013-07-30: 4 mg via INTRAVENOUS

## 2013-07-30 MED ORDER — DEXMEDETOMIDINE HCL IN NACL 400 MCG/100ML IV SOLN
0.0000 ug/kg/h | INTRAVENOUS | Status: DC
  Administered 2013-07-30 (×3): 1.2 ug/kg/h via INTRAVENOUS
  Administered 2013-07-30: 0.1 ug/kg/h via INTRAVENOUS
  Filled 2013-07-30 (×3): qty 50
  Filled 2013-07-30: qty 100
  Filled 2013-07-30: qty 50

## 2013-07-30 MED ORDER — MIDAZOLAM HCL 2 MG/2ML IJ SOLN
INTRAMUSCULAR | Status: AC
  Filled 2013-07-30: qty 4

## 2013-07-30 MED ORDER — DEXMEDETOMIDINE HCL IN NACL 400 MCG/100ML IV SOLN
0.0000 ug/kg/h | INTRAVENOUS | Status: DC
  Administered 2013-07-30: 0.8 ug/kg/h via INTRAVENOUS

## 2013-07-30 MED ORDER — QUETIAPINE FUMARATE 50 MG PO TABS
50.0000 mg | ORAL_TABLET | Freq: Two times a day (BID) | ORAL | Status: DC
  Administered 2013-07-30 – 2013-08-01 (×4): 50 mg via ORAL
  Filled 2013-07-30 (×6): qty 1

## 2013-07-30 MED ORDER — FUROSEMIDE 10 MG/ML IJ SOLN
40.0000 mg | Freq: Every day | INTRAMUSCULAR | Status: DC
Start: 2013-07-30 — End: 2013-08-01
  Administered 2013-07-30 – 2013-07-31 (×2): 40 mg via INTRAVENOUS
  Filled 2013-07-30 (×4): qty 4

## 2013-07-30 MED ORDER — HYDRALAZINE HCL 20 MG/ML IJ SOLN: 10.0000 mg | INTRAMUSCULAR | Status: DC | PRN

## 2013-07-30 MED ORDER — BISACODYL 10 MG RE SUPP
10.0000 mg | Freq: Once | RECTAL | Status: AC
  Administered 2013-07-30: 10 mg via RECTAL
  Filled 2013-07-30: qty 1

## 2013-07-30 NOTE — Progress Notes (Signed)
[facility] PCCM   Name: Robert [name] MRN: [id number] DOB: 11/20/1960    ADMISSION DATE:  07/26/2013  REFERRING MD :  EDP PRIMARY SERVICE: PCCM  CHIEF COMPLAINT:  Shortness of Breath  BRIEF PATIENT DESCRIPTION: 53 yo M, history of GOLD stage 4 COPD, HTN, HL, former tobacco use, presenting with a 2-3 week history of progressive dyspnea, cough, and hypoxia.  He was tried on BiPap in ED, but was eventually intubated.  Followed by Dr. Craige [name] in outpatient setting.   SIGNIFICANT EVENTS / STUDIES:  1/01 - presented with resp distress, likely AECOPD, failed BiPap -> intubated.  Agitation post-intubation, requiring neuromuscular blockade.  Hypotension following that, requiring phenylephrine. 1/02 - d/c'ed nimbex, weaned down propofol and phenylephrine 1/03 - off phenylephrine, self-extubated then re-intubated w/o complication, soft restraints ordered  LINES / TUBES: ETT 1/1 >> 1/3 self extub/reint >> L IJ CVL 1/2 >> Foley 1/1 >> OGT 1/1 >>  CULTURES: Blood cx 1/1 >> ngtd Resp Viral Panel 1/2 >> neg MRSA: Neg  ANTIBIOTICS: 1/1 Levaquin >> 1/2  oseltamivir >> 1/5  SUBJECTIVE: less agitation overnight from prior but still requiring soft mitt restraints on fentanyl and propofol currently  VITAL SIGNS: Temp:  [97.8 F (36.6 C)-98.9 F (37.2 C)] 98.2 F (36.8 C) (01/05 0808) Pulse Rate:  [48-73] 58 (01/05 0900) Resp:  [0-18] 14 (01/05 0900) BP: (98-127)/(59-81) 112/70 mmHg (01/05 0900) SpO2:  [90 %-99 %] 94 % (01/05 0900) FiO2 (%):  [40 %] 40 % (01/05 0900) Weight:  [263 lb 7.2 oz (119.5 kg)] 263 lb 7.2 oz (119.5 kg) (01/05 0200) HEMODYNAMICS: CVP:  [12 mmHg-13 mmHg] 13 mmHg VENTILATOR SETTINGS: Vent Mode:  [-] PRVC FiO2 (%):  [40 %] 40 % Set Rate:  [14 bmp] 14 bmp Vt Set:  [620 mL] 620 mL PEEP:  [5 cmH20] 5 cmH20 Plateau Pressure:  [13 cmH20-26 cmH20] 26 cmH20 INTAKE / OUTPUT: Intake/Output     01/04 0701 - 01/05 0700 01/05 0701 - 01/06 0700   I.V. (mL/kg) 1603.4 (13.4) 71.4  (0.6)   NG/GT 1210 30   IV Piggyback 100    Total Intake(mL/kg) 2913.4 (24.4) 101.4 (0.8)   Urine (mL/kg/hr) 1745 (0.6) 125 (0.4)   Total Output 1745 125   Net +1168.4 -23.6          PHYSICAL EXAMINATION: General: sedated on vent HEENT: ETT in place, NCAT, opens eyes to verbal stimuli PULM: mild Wheezing bilaterally CV: RRR, no mgr appreciated AB: BS+, soft, nontender Ext: 2+ LE edema, warm, distal pulses intact, in soft restraints Neuro: sedated on vent, awakens to name though easily agitated and started chewing on ETT on exam  LABS:  CBC  Recent Labs Lab 07/27/13 0830 07/29/13 0440 07/30/13 0653  WBC 11.2* 14.0* 14.7*  HGB 14.6 13.7 14.4  HCT 43.6 42.6 45.3  PLT 376 351 343   BMET  Recent Labs Lab 07/28/13 0345 07/29/13 0440 07/30/13 0653  NA 137 139 142  K 4.1 4.2 5.0  CL 96 97 97  CO2 31 34* 34*  BUN 19 31* 31*  CREATININE 0.93 0.86 0.83  GLUCOSE 170* 171* 181*   Electrolytes  Recent Labs Lab 07/28/13 0345 07/29/13 0440 07/30/13 0653  CALCIUM 8.6 8.3* 8.6   ABG  Recent Labs Lab 07/26/13 2018 07/27/13 0349  PHART 7.205* 7.347*  PCO2ART 98.2* 58.4*  PO2ART 390.0* 118.0*   Cardiac Enzymes  Recent Labs Lab 07/26/13 1900  TROPONINI <0.30   Glucose  Recent Labs Lab 07/29/13 1108 07/29/13 1517  07/29/13 1945 07/29/13 2359 07/30/13 0402 07/30/13 0739  GLUCAP 192* 143* 184* 214* 170* 179*    Imaging 1/4 CXR: no focal opacity  1/5 CXR: central pulmonary vasculature prominence  ASSESSMENT / PLAN:  PULMONARY A: Acute on Chronic Respiratory Failure AECOPD - mild wheezing bilateral lung fields, still with thick yellow secretions P:   -SBT today -continue Solumedrol 80 q6 -continue duonebs q6 -continue levaquin -ETT adjusted to ensure good volumes  CARDIOVASCULAR A:  Hypotension following intubation - improved History of HTN, now normotensive 112/70 this morning P:  -tele -cont hold home amlodipine, HCTZ,  cozaar  RENAL A:   Acute Kidney Injury - resolved P:   -cont CVP qshift to assess volume status, currently 13 mmHg -KVO fluids  Recent Labs Lab 07/26/13 1800 07/27/13 0830 07/28/13 0345 07/29/13 0440 07/30/13 0653  CREATININE 0.85 1.23 0.93 0.86 0.83     GASTROINTESTINAL A:   History of Hiatal Hernia Nutrition - P:   -protonix for SUP -appreciate nutrition recs; cont tube feeds   HEMATOLOGIC A:   No acute issues P:  -heparin for DVT ppx  INFECTIOUS A:   AECOPD - CXR central venous vascular prominence, flu SWAB negative, resp/blood cultures ngtd, bump in leukocytosis likely secondary to steroids P:   -continue levaquin (Day 5) for course of 7 days -sputum culture no organisms, blood culture ngtd -resp viral panel negative --> d/c oseltamiivir   Recent Labs Lab 07/26/13 1800 07/27/13 0830 07/29/13 0440 07/30/13 0653  WBC 11.1* 11.2* 14.0* 14.7*    ENDOCRINE A:   Steroid-induced hyperglycemia > improved P:   -continue SSI q4 hrs -cont Lantus 25 daily -cont Solu-Medrol 80 mg q8h with plan to decrease to 80 mg q12 once respiratory status improves CBG (last 3)   Recent Labs  07/29/13 2359 07/30/13 0402 07/30/13 0739  GLUCAP 214* 170* 179*    NEUROLOGIC A:  Acute encephalopathy secondary to respiratory failure P:   -transition from propofol to Precedex given rapid rise in triglycerides -wean down fentanyl -add Seroquel 50 mg q12h    Kristie CowmanSCHOOLER, KAREN, MD Internal Medicine Resident PGY3   Updated wife Care during the described time interval was provided by me and/or other providers on the critical care team.  I have reviewed this patient's available data, including medical history, events of note, physical examination and test results as part of my evaluation  CC time x 40 m   Cyril Mourningakesh Alva MD. Tonny BollmanFCCP. [facility] Pager [phone] If no response call [phone]

## 2013-07-30 NOTE — Progress Notes (Signed)
RT Note- placed back to full support, post ABG results.

## 2013-07-31 ENCOUNTER — Inpatient Hospital Stay (HOSPITAL_COMMUNITY): Payer: BC Managed Care – PPO

## 2013-07-31 LAB — GLUCOSE, CAPILLARY
GLUCOSE-CAPILLARY: 173 mg/dL — AB (ref 70–99)
GLUCOSE-CAPILLARY: 118 mg/dL — AB (ref 70–99)
Glucose-Capillary: 160 mg/dL — ABNORMAL HIGH (ref 70–99)
Glucose-Capillary: 167 mg/dL — ABNORMAL HIGH (ref 70–99)
Glucose-Capillary: 177 mg/dL — ABNORMAL HIGH (ref 70–99)
Glucose-Capillary: 200 mg/dL — ABNORMAL HIGH (ref 70–99)

## 2013-07-31 LAB — CBC
HCT: 45.1 % (ref 39.0–52.0)
Hemoglobin: 14.6 g/dL (ref 13.0–17.0)
MCH: 30.5 pg (ref 26.0–34.0)
MCHC: 32.4 g/dL (ref 30.0–36.0)
MCV: 94.2 fL (ref 78.0–100.0)
Platelets: 366 10*3/uL (ref 150–400)
RBC: 4.79 MIL/uL (ref 4.22–5.81)
RDW: 14 % (ref 11.5–15.5)
WBC: 13.5 10*3/uL — ABNORMAL HIGH (ref 4.0–10.5)

## 2013-07-31 LAB — BASIC METABOLIC PANEL
BUN: 35 mg/dL — AB (ref 6–23)
CO2: 30 mEq/L (ref 19–32)
CREATININE: 0.76 mg/dL (ref 0.50–1.35)
Calcium: 8.4 mg/dL (ref 8.4–10.5)
Chloride: 94 mEq/L — ABNORMAL LOW (ref 96–112)
GFR calc non Af Amer: >90 mL/min (ref >90)
Glucose, Bld: 174 mg/dL — ABNORMAL HIGH (ref 70–99)
Potassium: 4.7 mEq/L (ref 3.7–5.3)
Sodium: 142 mEq/L (ref 137–147)

## 2013-07-31 LAB — HEPATITIS PANEL, ACUTE
HCV AB: NEGATIVE
HEP B C IGM: NONREACTIVE
HEP B S AG: NEGATIVE
Hep A IgM: NONREACTIVE

## 2013-07-31 LAB — HIV ANTIBODY (ROUTINE TESTING W REFLEX): HIV: NONREACTIVE

## 2013-07-31 MED ORDER — LOSARTAN POTASSIUM 50 MG PO TABS
100.0000 mg | ORAL_TABLET | Freq: Every day | ORAL | Status: DC
  Administered 2013-07-31 – 2013-08-07 (×8): 100 mg via ORAL
  Filled 2013-07-31 (×8): qty 2

## 2013-07-31 MED ORDER — AMLODIPINE BESYLATE 5 MG PO TABS
5.0000 mg | ORAL_TABLET | Freq: Every day | ORAL | Status: DC
  Administered 2013-07-31 – 2013-08-01 (×2): 5 mg via ORAL
  Filled 2013-07-31 (×3): qty 1

## 2013-07-31 MED ORDER — METHYLPREDNISOLONE SODIUM SUCC 125 MG IJ SOLR
80.0000 mg | Freq: Two times a day (BID) | INTRAMUSCULAR | Status: DC
  Administered 2013-07-31 – 2013-08-01 (×2): 80 mg via INTRAVENOUS
  Filled 2013-07-31 (×3): qty 1.28

## 2013-07-31 NOTE — Progress Notes (Signed)
200 of fentanyl and 80 of precedex was wasted in the sink. Witnessed by Loews CorporationCandance Hemphil

## 2013-07-31 NOTE — Procedures (Signed)
Extubation Procedure Note  Patient Details:   Name: Gerarda GuntherOrlando Kreiter DOB: [birth year] MRN: [id number]   Airway Documentation:     Evaluation  O2 sats: stable throughout Complications: No apparent complications Patient did tolerate procedure well. Bilateral Breath Sounds: Rhonchi Suctioning: Airway Yes Vocalization post extubation Placed on BIPAP/NIV 14/5 40% per Dr. Helyn NumbersAlva  Tatisha Cerino, Norina BuzzardKimberly Ann 07/31/2013, 9:37 AM

## 2013-07-31 NOTE — Progress Notes (Signed)
Pt off bipap at this time.  RT will continue to monmitor.

## 2013-07-31 NOTE — Progress Notes (Signed)
Thank you for [facility] to meet your patient's and family's needs.   The reason that you asked us to see your patient is  For Clarification of GOC and options  I spoke with both patient and his wife and scheduled an appointment for today at 2 pm, however when I returned for meeting his wife informed me that her husband is refusing consultation.  I let her know that they could request our services at any time.  Let us know if we can be of assistance in the future.  Lorinda CreedMary Larach NP  Palliative Medicine Team Team Phone # [phone] Pager [phone]  T

## 2013-07-31 NOTE — Progress Notes (Signed)
[facility] P[facility]   Name: Robert [name] MRN: [id number] DOB: 1960/09/01    ADMISSION DATE:  07/26/2013  REFERRING MD :  EDP PRIMARY SERVICE: PCCM  CHIEF COMPLAINT:  Shortness of Breath  BRIEF PATIENT DESCRIPTION: 53 yo M, history of GOLD stage 4 COPD, HTN, HL, former tobacco use, presenting with a 2-3 week history of progressive dyspnea, cough, and hypoxia.  He was tried on BiPap in ED, but was eventually intubated.  Followed by Dr. Craige CottaSood in outpatient setting.   SIGNIFICANT EVENTS / STUDIES:  1/01 - presented with resp distress, likely AECOPD, failed BiPap -> intubated.  Agitation post-intubation, requiring neuromuscular blockade.  Hypotension following that, requiring phenylephrine. 1/02 - d/c'ed nimbex, weaned down propofol and phenylephrine 1/03 - off phenylephrine, self-extubated then re-intubated w/o complication, soft restraints ordered  LINES / TUBES: ETT 1/1 >> 1/3 self extub/reint >> L IJ CVL 1/2 >> Foley 1/1 >> OGT 1/1 >>  CULTURES: Blood cx 1/1 >> ngtd Resp Viral Panel 1/2 >> neg MRSA: Neg  ANTIBIOTICS: 1/1 Levaquin >> 1/2  oseltamivir >> 1/5  SUBJECTIVE: severe agitation overnight on max Precedex and Fentanyl, transitioned back to Propofol with full vent support  VITAL SIGNS: Temp:  [98 F (36.7 C)-100.4 F (38 C)] 99.9 F (37.7 C) (01/06 0402) Pulse Rate:  [48-104] 60 (01/06 0100) Resp:  [13-23] 14 (01/06 0100) BP: (105-184)/(62-146) 122/68 mmHg (01/06 0100) SpO2:  [94 %-100 %] 99 % (01/06 0108) FiO2 (%):  [40 %] 40 % (01/06 0351) Weight:  [264 lb 1.8 oz (119.8 kg)] 264 lb 1.8 oz (119.8 kg) (01/06 0500) HEMODYNAMICS: CVP:  [12 mmHg-13 mmHg] 12 mmHg VENTILATOR SETTINGS: Vent Mode:  [-] PRVC FiO2 (%):  [40 %] 40 % Set Rate:  [14 bmp] 14 bmp Vt Set:  [620 mL] 620 mL PEEP:  [5 cmH20] 5 cmH20 Pressure Support:  [5 cmH20] 5 cmH20 Plateau Pressure:  [20 cmH20-26 cmH20] 21 cmH20 INTAKE / OUTPUT: Intake/Output     01/05 0701 - 01/06 0700   I.[name]. (mL/kg) 1189.8  (9.9)   NG/GT 530   IV Piggyback 100   Total Intake(mL/kg) 1819.8 (15.2)   Urine (mL/kg/hr) 2775 (1)   Total Output 2775   Net -955.3       Emesis Occurrence 2 x     PHYSICAL EXAMINATION: General: sedated on vent HEENT: ETT in place, NCAT, opens eyes to verbal stimuli PULM: mild Wheezing bilaterally CV: RRR, no mgr appreciated AB: BS+, soft, nontender Ext: 2+ LE edema, warm, distal pulses intact, in soft restraints Neuro: sedated on vent, awakens to name though easily agitated and started chewing on ETT on exam  LABS:  CBC  Recent Labs Lab 07/29/13 0440 07/30/13 0653 07/31/13 0340  WBC 14.0* 14.7* 13.5*  HGB 13.7 14.4 14.6  HCT 42.6 45.3 45.1  PLT 351 343 366   BMET  Recent Labs Lab 07/29/13 0440 07/30/13 0653 07/31/13 0340  NA 139 142 142  K 4.2 5.0 4.7  CL 97 97 94*  CO2 34* 34* 30  BUN 31* 31* 35*  CREATININE 0.86 0.83 0.76  GLUCOSE 171* 181* 174*   Electrolytes  Recent Labs Lab 07/29/13 0440 07/30/13 0653 07/31/13 0340  CALCIUM 8.3* 8.6 8.4   ABG  Recent Labs Lab 07/26/13 2018 07/27/13 0349 07/30/13 1355  PHART 7.205* 7.347* 7.336*  PCO2ART 98.2* 58.4* 76.5*  PO2ART 390.0* 118.0* 67.0*   Cardiac Enzymes  Recent Labs Lab 07/26/13 1900  TROPONINI <0.30   Glucose  Recent Labs Lab 07/30/13 0739  07/30/13 1210 07/30/13 1534 07/30/13 1932 07/31/13 0036 07/31/13 0401  GLUCAP 179* 172* 177* 177* 177* 173*    Imaging 1/4 CXR: no focal opacity  1/5 CXR: central pulmonary vasculature prominence 1/6 CXR:   ASSESSMENT / PLAN:  PULMONARY A: Acute on Chronic Respiratory Failure AECOPD - mild wheezing bilateral lung fields, still with thick yellow secretions P:   -SBT today with plan to extubate to BiPAP -continue Solumedrol with plan for 80 q12 -continue duonebs q6 -continue levaquin for 7 day course  CARDIOVASCULAR A: Hypotension following intubation - improved History of HTN, now normotensive 112/70 this morning P:   -telemetry -cont hold home amlodipine, HCTZ, cozaar for now--> resume if becomes hypertensive  RENAL A:   Acute Kidney Injury - resolved P:   -cont CVP qshift to assess volume status, currently 13 mmHg -KVO fluids  Recent Labs Lab 07/27/13 0830 07/28/13 0345 07/29/13 0440 07/30/13 0653 07/31/13 0340  CREATININE 1.23 0.93 0.86 0.83 0.76     GASTROINTESTINAL A:   History of Hiatal Hernia Nutrition - P:   -protonix for SUP -appreciate nutrition recs; cont tube feeds   HEMATOLOGIC A:   No acute issues P:  -heparin for DVT ppx  INFECTIOUS A:  AECOPD - CXR central venous vascular prominence, flu SWAB negative, resp/blood cultures ngtd, bump in leukocytosis likely secondary to steroids P:   -continue levaquin (Day 6) for course of 7 days -sputum culture no organisms, blood culture ngtd -resp viral panel negative --> d/c oseltamiivir   Recent Labs Lab 07/26/13 1800 07/27/13 0830 07/29/13 0440 07/30/13 0653 07/31/13 0340  WBC 11.1* 11.2* 14.0* 14.7* 13.5*    ENDOCRINE A:  Steroid-induced hyperglycemia > improved P:   -continue SSI q4 hrs -cont Lantus 25 daily -cont Solu-Medrol with plan to decrease to 80 mg q12 once respiratory status improves CBG (last 3)   Recent Labs  07/30/13 1932 07/31/13 0036 07/31/13 0401  GLUCAP 177* 177* 173*    NEUROLOGIC A:  Acute encephalopathy secondary to respiratory failure P:   -transitioned from propofol to Precedex given rapid rise in triglycerides yesterday with wean of Fentanyl but overnight placed back on Propofol secondary to severe agitation -cont Seroquel 50 mg q12h -dc sedation gtt close to extubation    Kristie Cowman, MD Internal Medicine Resident PGY3  Re-examined pt after extubation, transitioned off bipap to [place] Updated wife   Care during the described time interval was provided by me and/or other providers on the critical care team.  I have reviewed this patient's available data, including  medical history, events of note, physical examination and test results as part of my evaluation  CC time x  40 m  Robert [name],Robert [name].

## 2013-08-01 DIAGNOSIS — R5381 Other malaise: Secondary | ICD-10-CM

## 2013-08-01 DIAGNOSIS — J9600 Acute respiratory failure, unspecified whether with hypoxia or hypercapnia: Secondary | ICD-10-CM

## 2013-08-01 LAB — GLUCOSE, CAPILLARY
GLUCOSE-CAPILLARY: 200 mg/dL — AB (ref 70–99)
GLUCOSE-CAPILLARY: 181 mg/dL — AB (ref 70–99)
Glucose-Capillary: 131 mg/dL — ABNORMAL HIGH (ref 70–99)
Glucose-Capillary: 135 mg/dL — ABNORMAL HIGH (ref 70–99)
Glucose-Capillary: 149 mg/dL — ABNORMAL HIGH (ref 70–99)
Glucose-Capillary: 186 mg/dL — ABNORMAL HIGH (ref 70–99)

## 2013-08-01 LAB — TRIGLYCERIDES: TRIGLYCERIDES: 371 mg/dL — AB (ref <150)

## 2013-08-01 MED ORDER — ADULT MULTIVITAMIN W/MINERALS CH
1.0000 | ORAL_TABLET | Freq: Every day | ORAL | Status: DC
  Administered 2013-08-01 – 2013-08-07 (×7): 1 via ORAL
  Filled 2013-08-01 (×7): qty 1

## 2013-08-01 MED ORDER — METHYLPREDNISOLONE SODIUM SUCC 40 MG IJ SOLR
40.0000 mg | Freq: Two times a day (BID) | INTRAMUSCULAR | Status: DC
  Filled 2013-08-01: qty 1

## 2013-08-01 MED ORDER — HYDROCHLOROTHIAZIDE 25 MG PO TABS: 12.5000 mg | ORAL_TABLET | Freq: Every day | ORAL | Status: DC

## 2013-08-01 MED ORDER — HYDROCHLOROTHIAZIDE 12.5 MG PO CAPS
12.5000 mg | ORAL_CAPSULE | Freq: Every day | ORAL | Status: DC
  Administered 2013-08-01 – 2013-08-07 (×7): 12.5 mg via ORAL
  Filled 2013-08-01 (×7): qty 1

## 2013-08-01 MED ORDER — PREDNISONE 50 MG PO TABS
50.0000 mg | ORAL_TABLET | Freq: Every day | ORAL | Status: DC
  Administered 2013-08-02: 50 mg via ORAL
  Filled 2013-08-01 (×3): qty 1

## 2013-08-01 NOTE — Progress Notes (Signed)
Rehab Admissions Coordinator Note:  Patient was screened by Trish MageLogue, Maksim Peregoy M for appropriateness for an Inpatient Acute Rehab Consult.  At this time, an inpatient rehab consult is pending completion today.  We will follow up after consult is completed.  Lelon FrohlichLogue, Aksh Swart M 08/01/2013, 4:03 PM  I can be reached at [phone].

## 2013-08-01 NOTE — Evaluation (Signed)
Physical Therapy Evaluation Patient Details Name: Robert [name] MRN: [id number] DOB: Jul 28, 1960 Today's Date: 08/01/2013 Time: 1220-1250 PT Time Calculation (min): 30 min  PT Assessment / Plan / Recommendation History of Present Illness  53 yo male [place], former smoker presented to ED with progressive dyspnea, cough, and hypoxia or 2 to 3 weeks.  He was tried on BiPAP, but eventually required intubation.  Clinical Impression  Pt admitted with in respiratory distress. Pt currently with functional limitations due to the deficits listed below (see PT Problem List).  Pt will benefit from skilled PT to increase their independence and safety with mobility to allow discharge to the venue listed below.       PT Assessment  Patient needs continued PT services    Follow Up Recommendations  CIR;Supervision/Assistance - 24 hour    Does the patient have the potential to tolerate intense rehabilitation      Barriers to Discharge Decreased caregiver support      Equipment Recommendations  Other (comment) (TBA next venue)    Recommendations for Other Services Rehab consult   Frequency Min 3X/week    Precautions / Restrictions Precautions Precautions: Fall   Pertinent Vitals/Pain EHR increased into the mid 140's o/w stable       Mobility       Exercises     PT Diagnosis: Generalized weakness;Difficulty walking  PT Problem List: Decreased strength;Decreased activity tolerance;Decreased balance;Decreased mobility;Decreased knowledge of use of DME;Decreased knowledge of precautions;Cardiopulmonary status limiting activity PT Treatment Interventions: DME instruction;Gait training;Stair training;Functional mobility training;Therapeutic activities;Balance training;Patient/family education     PT Goals(Current goals can be found in the care plan section) Acute Rehab PT Goals Patient Stated Goal: Home and independent PT Goal Formulation: With patient/family Time For Goal Achievement:  08/15/13 Potential to Achieve Goals: Good  Visit Information  Last PT Received On: 08/01/13 Assistance Needed: +2 (safety lines) History of Present Illness: 53 yo male [place], former smoker presented to ED with progressive dyspnea, cough, and hypoxia or 2 to 3 weeks.  He was tried on BiPAP, but eventually required intubation.       Prior Functioning  Home Living Family/patient expects to be discharged to:: Private residence Living Arrangements: Spouse/significant other Available Help at Discharge: Available PRN/intermittently;Family Type of Home: House Home Access: Level entry Home Layout: Two level;Bed/bath upstairs Alternate Level Stairs-Number of Steps: 15 Alternate Level Stairs-Rails: Right;Left Home Equipment: None Prior Function Level of Independence: Independent Communication Communication: No difficulties    Cognition  Cognition Arousal/Alertness: Awake/alert Behavior During Therapy: WFL for tasks assessed/performed;Flat affect Overall Cognitive Status: Within Functional Limits for tasks assessed (slow to process)    Extremity/Trunk Assessment Upper Extremity Assessment Upper Extremity Assessment: Overall WFL for tasks assessed;Generalized weakness Lower Extremity Assessment Lower Extremity Assessment: Overall WFL for tasks assessed;Generalized weakness (fatigues easily, little muscular endurance)   Balance    End of Session PT - End of Session Equipment Utilized During Treatment: Oxygen Activity Tolerance: Patient tolerated treatment well Patient left: in chair;with call bell/phone within reach;with nursing/sitter in room Nurse Communication: Mobility status  GP     Florrie Ramires, Eliseo GumKenneth V 08/01/2013, 1:09 PM 08/01/2013  [place] BingKen Fradel Baldonado, PT [phone]  (pager)

## 2013-08-01 NOTE — Progress Notes (Signed)
[facility] PCCM   Name: Robert [name] MRN: [id number] DOB: 1960/11/20    ADMISSION DATE:  07/26/2013  REFERRING MD :  EDP PRIMARY SERVICE: PCCM  CHIEF COMPLAINT:  Shortness of Breath  BRIEF PATIENT DESCRIPTION: 53 yo M, history of GOLD stage 4 COPD, HTN, HL, former tobacco use, presenting with a 2-3 week history of progressive dyspnea, cough, and hypoxia.  He was tried on BiPap in ED, but was eventually intubated.  Followed by Dr. Craige Cotta in outpatient setting.   SIGNIFICANT EVENTS / STUDIES:  1/01 - presented with resp distress, likely AECOPD, failed BiPap -> intubated.  Agitation post-intubation, requiring neuromuscular blockade.  Hypotension following that, requiring phenylephrine. 1/02 - d/c'ed nimbex, weaned down propofol and phenylephrine 1/03 - off phenylephrine, self-extubated then re-intubated w/o complication, soft restraints ordered 1/06- extubated to BiPap  LINES / TUBES: ETT 1/1 >> 1/3 self extub/reint >>1/6 L IJ CVL 1/2 >> Foley 1/1 >>1/6 OGT 1/1 >>1/6  CULTURES: Blood cx 1/1 >> ngtd Resp Viral Panel 1/2 >> neg MRSA: Neg  ANTIBIOTICS: 1/1 Levaquin >> 1/2  oseltamivir >> 1/5  SUBJECTIVE: no acute events overnight Improving dyspnea, cough No CP  VITAL SIGNS: Temp:  [98.1 F (36.7 C)-100.5 F (38.1 C)] 98.1 F (36.7 C) (01/07 0814) Pulse Rate:  [76-106] 106 (01/07 1100) Resp:  [9-23] 20 (01/07 1100) BP: (137-189)/(79-96) 137/84 mmHg (01/07 1100) SpO2:  [92 %-97 %] 95 % (01/07 1100) Weight:  [252 lb 13.9 oz (114.7 kg)] 252 lb 13.9 oz (114.7 kg) (01/07 0400) HEMODYNAMICS: CVP:  [3 mmHg-9 mmHg] 5 mmHg    INTAKE / OUTPUT: Intake/Output     01/06 0701 - 01/07 0700 01/07 0701 - 01/08 0700   I.V. (mL/kg) 502.9 (4.4) 20 (0.2)   NG/GT 75    IV Piggyback 100    Total Intake(mL/kg) 677.9 (5.9) 20 (0.2)   Urine (mL/kg/hr) 2950 (1.1) 200 (0.4)   Total Output 2950 200   Net -2272.1 -180          PHYSICAL EXAMINATION: General: NAD HEENT: EOMI, NCAT PULM:  ctab CV: RRR, no mgr appreciated AB: BS+, soft, nontender Ext: 2+ LE edema, warm, distal pulses intact Neuro: alert and oriented to self and place, grossly non-focal  LABS:  CBC  Recent Labs Lab 07/29/13 0440 07/30/13 0653 07/31/13 0340  WBC 14.0* 14.7* 13.5*  HGB 13.7 14.4 14.6  HCT 42.6 45.3 45.1  PLT 351 343 366   BMET  Recent Labs Lab 07/29/13 0440 07/30/13 0653 07/31/13 0340  NA 139 142 142  K 4.2 5.0 4.7  CL 97 97 94*  CO2 34* 34* 30  BUN 31* 31* 35*  CREATININE 0.86 0.83 0.76  GLUCOSE 171* 181* 174*   Electrolytes  Recent Labs Lab 07/29/13 0440 07/30/13 0653 07/31/13 0340  CALCIUM 8.3* 8.6 8.4   ABG  Recent Labs Lab 07/26/13 2018 07/27/13 0349 07/30/13 1355  PHART 7.205* 7.347* 7.336*  PCO2ART 98.2* 58.4* 76.5*  PO2ART 390.0* 118.0* 67.0*   Cardiac Enzymes  Recent Labs Lab 07/26/13 1900  TROPONINI <0.30   Glucose  Recent Labs Lab 07/31/13 1141 07/31/13 1542 07/31/13 1918 08/01/13 0004 08/01/13 0409 08/01/13 0808  GLUCAP 167* 160* 118* 149* 186* 131*    Imaging 1/4 CXR: no focal opacity  1/5 CXR: central pulmonary vasculature prominence 1/6 CXR: stable central pulm vasc congestion  ASSESSMENT / PLAN:  PULMONARY A: Acute on Chronic Respiratory Failure AECOPD - lungs clear bilateral lung fields, still with thick yellow secretions P:   -dc  Solu-Medrol -prednisone 40 mg -slow taper over 2 wks -continue duonebs q6 -Levaquin  CARDIOVASCULAR A:  Hypotension following intubation - improved History of HTN, now normotensive  P:  -tele -cont amlodipine, cozaar -resume home HCTZ  RENAL A:   Acute Kidney Injury - resolved, 3L UOP overnight with net 2L P:   -dc CVP  -KVO fluids -hold Lasix for now  Recent Labs Lab 07/27/13 0830 07/28/13 0345 07/29/13 0440 07/30/13 0653 07/31/13 0340  CREATININE 1.23 0.93 0.86 0.83 0.76     GASTROINTESTINAL A:   History of Hiatal Hernia Nutrition - P:   -d/c IV  Protonix -advancing diet  HEMATOLOGIC A:   No acute issues P:  -heparin for DVT ppx  INFECTIOUS A:   AECOPD - CXR central venous vascular prominence, flu SWAB negative, resp/blood cultures ngtd, bump in leukocytosis likely secondary to steroids now trending downward P:   -Day 7 of 7 Levaquin -sputum culture no organisms, blood culture ngtd -resp viral panel negative --> d/c oseltamiivir   Recent Labs Lab 07/26/13 1800 07/27/13 0830 07/29/13 0440 07/30/13 0653 07/31/13 0340  WBC 11.1* 11.2* 14.0* 14.7* 13.5*    ENDOCRINE A:   Steroid-induced hyperglycemia > improved P:   -continue SSI q4 hrs -cont Lantus 25 daily -titrate down Styeroids  CBG (last 3)   Recent Labs  08/01/13 0004 08/01/13 0409 08/01/13 0808  GLUCAP 149* 186* 131*    NEUROLOGIC A:  Acute encephalopathy secondary to respiratory failure--> resolved P:   -d/c Seroquel   Dispo: transfer out of ICU today to floor.CIR consult  Kristie CowmanSCHOOLER, KAREN, MD Internal Medicine Resident PGY3  Care during the described time interval was provided by me and/or other providers on the critical care team.  I have reviewed this patient's available data, including medical history, events of note, physical examination and test results as part of my evaluation  Shalece Staffa V.

## 2013-08-01 NOTE — Consult Note (Signed)
Physical Medicine and Rehabilitation Consult  Reason for Consult: Deconditioning Referring Physician: Dr. Vassie Loll.   HPI: Robert [name] is a 53 y.o. male with history of GOLD stage 4 COPD, HTN, HL, former tobacco use, presenting with a 2-3 week history of progressive dyspnea, cough, and hypoxia. He required intubation of respiratory distress due to AECOPD. Treated with solu-medrol with antibiotics--flu swab negative. Patient with agitation and self extubated on 01/03. He was reintubated without complications and treated with percedex and fentanyl. He was extubation to BIPAP on 07/31/13.  PT evaluation done today and CIR recommended due to deconditioned state.    Review of Systems  Eyes: Negative for blurred vision and double vision.  Respiratory: Positive for cough and shortness of breath.   Cardiovascular: Negative for chest pain and palpitations.  Gastrointestinal: Negative for heartburn, abdominal pain and constipation.  Genitourinary: Negative for urgency and frequency.  Musculoskeletal: Negative for back pain, joint pain and myalgias.  Neurological: Positive for weakness. Negative for headaches.    Past Medical History  Diagnosis Date  . COPD with emphysema        . Hyperlipidemia   . Hypertension   . Nephrolithiasis   . Hiatal hernia   . Seasonal allergies   . Pulmonary nodule, right 12/10/2010  . Nocturnal hypoxemia due to emphysema   . ACE-inhibitor cough     Past Surgical History  Procedure Laterality Date  . Knee arthroscopy  2005    right knee   Family History  Problem Relation Age of Onset  . Pancreatic cancer Father   . Heart disease Sister     Social History:  Married. 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 He reports that he quit smoking about 21 years ago. His smoking use included Cigarettes. He has a 24 pack-year smoking history. He has never used smokeless tobacco. He reports that he drinks alcohol. He reports that he does not use  illicit drugs.   Allergies  Allergen Reactions  . Ace Inhibitors Cough  . Aspirin     REACTION: intolerance  . Latex Other (See Comments)    unknown    Medications Prior to Admission  Medication Sig Dispense Refill  . albuterol (PROVENTIL HFA;VENTOLIN HFA) 108 (90 BASE) MCG/ACT inhaler Inhale 2 puffs into the lungs every 6 (six) hours as needed for wheezing or shortness of breath.      Marland Kitchen amLODipine (NORVASC) 10 MG tablet Take 5 mg by mouth daily.      Marland Kitchen atorvastatin (LIPITOR) 80 MG tablet Take 40 mg by mouth at bedtime.      . budesonide-formoterol (SYMBICORT) 160-4.5 MCG/ACT inhaler Inhale 2 puffs into the lungs 2 (two) times daily.      . cetirizine (ZYRTEC) 10 MG tablet Take 10 mg by mouth daily.        . Cholecalciferol (VITAMIN D3) 2000 UNITS TABS Take 2,000 Units by mouth daily.      . flunisolide (NASALIDE) 25 MCG/ACT (0.025%) SOLN Place 2 sprays into the nose daily as needed (allergies).      . hydrochlorothiazide (HYDRODIURIL) 25 MG tablet Take 12.5 mg by mouth daily.      Marland Kitchen HYDROcodone-acetaminophen (NORCO/VICODIN) 5-325 MG per tablet Take 1 tablet by mouth 3 (three) times daily as needed for moderate pain.      Marland Kitchen losartan (COZAAR) 100 MG tablet Take 100 mg by mouth daily.        . Omega-3 Fatty Acids (FISH OIL) 1000 MG CAPS Take 1,000 mg by mouth 2 (two)  times daily.      . pantoprazole (PROTONIX) 40 MG tablet Take 40 mg by mouth 2 (two) times daily.      Marland Kitchen. tiotropium (SPIRIVA) 18 MCG inhalation capsule Place 1 capsule (18 mcg total) into inhaler and inhale daily.  30 capsule  6  . urea (CARMOL) 20 % cream Apply 1 application topically daily as needed (rash).        Home: Home Living Family/patient expects to be discharged to:: Private residence Living Arrangements: Spouse/significant other Available Help at Discharge: Available PRN/intermittently;Family Type of Home: House Home Access: Level entry Home Layout: Two level;Bed/bath upstairs Alternate Level Stairs-Number  of Steps: 15 Alternate Level Stairs-Rails: Right;Left Home Equipment: None  Functional History:   Functional Status:  Mobility:          ADL:    Cognition: Cognition Overall Cognitive Status: Within Functional Limits for tasks assessed (slow to process) Orientation Level: Oriented to person;Oriented to place;Other (comment) (pt states "paintings on the wall are swirling") Cognition Arousal/Alertness: Awake/alert Behavior During Therapy: WFL for tasks assessed/performed;Flat affect Overall Cognitive Status: Within Functional Limits for tasks assessed (slow to process)  Blood pressure 137/84, pulse 106, temperature 99.4 F (37.4 C), temperature source Oral, resp. rate 20, height 5\' 9"  (1.753 m), weight 114.7 kg (252 lb 13.9 oz), SpO2 95.00%. Physical Exam  Nursing note and vitals reviewed. Constitutional: He is oriented to person, place, and time. He appears well-developed and well-nourished.  Diaphoretic--just walked back from bathroom with nurse.  HENT:  Head: Normocephalic and atraumatic.  Eyes: Conjunctivae are normal. Pupils are equal, round, and reactive to light.  Neck: Normal range of motion. Neck supple.  Cardiovascular: Regular rhythm.  Tachycardia present.   Respiratory: Effort normal and breath sounds normal. No respiratory distress. He has no wheezes.  dysphonia noted. Dyspneic with conversation  GI: Soft. Bowel sounds are normal. He exhibits no distension. There is no tenderness.  Musculoskeletal: He exhibits no edema and no tenderness.  Neurological: He is alert and oriented to person, place, and time.  Moves all 4. Strength grossly 4- in the upper extremities distal stronger than proximal. LE's were 3+ HF, 4- at knees and ankles. No sensory deficits.   Skin: Skin is warm and dry.  Psychiatric: He has a normal mood and affect. His behavior is normal. Judgment and thought content normal.    Results for orders placed during the hospital encounter of 07/26/13  (from the past 24 hour(s))  GLUCOSE, CAPILLARY     Status: Abnormal   Collection Time    07/31/13  3:42 PM      Result Value Range   Glucose-Capillary 160 (*) 70 - 99 mg/dL  GLUCOSE, CAPILLARY     Status: Abnormal   Collection Time    07/31/13  7:18 PM      Result Value Range   Glucose-Capillary 118 (*) 70 - 99 mg/dL  GLUCOSE, CAPILLARY     Status: Abnormal   Collection Time    08/01/13 12:04 AM      Result Value Range   Glucose-Capillary 149 (*) 70 - 99 mg/dL   Comment 1 Notify RN    TRIGLYCERIDES     Status: Abnormal   Collection Time    08/01/13  2:25 AM      Result Value Range   Triglycerides 371 (*) <150 mg/dL  GLUCOSE, CAPILLARY     Status: Abnormal   Collection Time    08/01/13  4:09 AM      Result Value  Range   Glucose-Capillary 186 (*) 70 - 99 mg/dL   Comment 1 Notify RN    GLUCOSE, CAPILLARY     Status: Abnormal   Collection Time    08/01/13  8:08 AM      Result Value Range   Glucose-Capillary 131 (*) 70 - 99 mg/dL  GLUCOSE, CAPILLARY     Status: Abnormal   Collection Time    08/01/13 11:58 AM      Result Value Range   Glucose-Capillary 200 (*) 70 - 99 mg/dL   Dg Chest Port 1 View  07/31/2013   CLINICAL DATA:  Check endotracheal tube placement  EXAM: PORTABLE CHEST - 1 VIEW  COMPARISON:  07/30/2013  FINDINGS: An endotracheal tube and nasogastric catheter are again identified in satisfactory position. The endotracheal tube is 5.6 cm above the carina. A left central venous line is again noted with the catheter tip in the proximal superior vena cava. The cardiac shadow is stable. Mild central vascular prominence is again noted. No focal confluent infiltrate is seen. No pneumothorax is noted.  IMPRESSION: Tubes and lines as described in stable position.  Continued central pulmonary vascular congestion.   Electronically Signed   By: Alcide Clever M.D.   On: 07/31/2013 07:40    Assessment/Plan: Diagnosis: deconditioning after acute on chronic respiratory failure 1. Does  the need for close, 24 hr/day medical supervision in concert with the patient's rehab needs make it unreasonable for this patient to be served in a less intensive setting? Yes 2. Co-Morbidities requiring supervision/potential complications: COPD 3. Due to bladder management, bowel management, safety, skin/wound care, disease management, medication administration, pain management and patient education, does the patient require 24 hr/day rehab nursing? Yes 4. Does the patient require coordinated care of a physician, rehab nurse, PT (1-2 hrs/day, 5 days/week) and OT (1-2 hrs/day, 5 days/week) to address physical and functional deficits in the context of the above medical diagnosis(es)? Yes Addressing deficits in the following areas: balance, endurance, locomotion, strength, transferring, bowel/bladder control, bathing, dressing, feeding, grooming, toileting and psychosocial support 5. Can the patient actively participate in an intensive therapy program of at least 3 hrs of therapy per day at least 5 days per week? Yes 6. The potential for patient to make measurable gains while on inpatient rehab is excellent 7. Anticipated functional outcomes upon discharge from inpatient rehab are mod I with PT, mod I with OT, n'a with SLP. 8. Estimated rehab length of stay to reach the above functional goals is: 7-9 days 9. Does the patient have adequate social supports to accommodate these discharge functional goals? Yes 10. Anticipated D/C setting: Home 11. Anticipated post D/C treatments: HH therapy 12. Overall Rehab/Functional Prognosis: excellent  RECOMMENDATIONS: This patient's condition is appropriate for continued rehabilitative care in the following setting: CIR Patient has agreed to participate in recommended program. Yes Note that insurance prior authorization may be required for reimbursement for recommended care.  Comment: Rehab Admissions Coordinator to follow up.  Thanks,  Ranelle Oyster, MD,  [place]     08/01/2013

## 2013-08-02 ENCOUNTER — Inpatient Hospital Stay (HOSPITAL_COMMUNITY): Payer: BC Managed Care – PPO

## 2013-08-02 LAB — CULTURE, BLOOD (ROUTINE X 2): Culture: NO GROWTH

## 2013-08-02 LAB — GLUCOSE, CAPILLARY
GLUCOSE-CAPILLARY: 125 mg/dL — AB (ref 70–99)
Glucose-Capillary: 113 mg/dL — ABNORMAL HIGH (ref 70–99)
Glucose-Capillary: 128 mg/dL — ABNORMAL HIGH (ref 70–99)
Glucose-Capillary: 131 mg/dL — ABNORMAL HIGH (ref 70–99)
Glucose-Capillary: 142 mg/dL — ABNORMAL HIGH (ref 70–99)
Glucose-Capillary: 162 mg/dL — ABNORMAL HIGH (ref 70–99)
Glucose-Capillary: 178 mg/dL — ABNORMAL HIGH (ref 70–99)

## 2013-08-02 MED ORDER — POLYETHYLENE GLYCOL 3350 17 G PO PACK
17.0000 g | PACK | Freq: Every day | ORAL | Status: DC
  Administered 2013-08-02 – 2013-08-07 (×4): 17 g via ORAL
  Filled 2013-08-02 (×6): qty 1

## 2013-08-02 MED ORDER — PREDNISONE 20 MG PO TABS
40.0000 mg | ORAL_TABLET | Freq: Every day | ORAL | Status: DC
  Administered 2013-08-03 – 2013-08-06 (×4): 40 mg via ORAL
  Filled 2013-08-02 (×8): qty 2

## 2013-08-02 MED ORDER — INSULIN GLARGINE 100 UNIT/ML ~~LOC~~ SOLN
20.0000 [IU] | Freq: Every day | SUBCUTANEOUS | Status: DC
Start: 2013-08-02 — End: 2013-08-06
  Administered 2013-08-02 – 2013-08-05 (×4): 20 [IU] via SUBCUTANEOUS
  Filled 2013-08-02 (×5): qty 0.2

## 2013-08-02 MED ORDER — AMLODIPINE BESYLATE 10 MG PO TABS
10.0000 mg | ORAL_TABLET | Freq: Every day | ORAL | Status: DC
  Administered 2013-08-02 – 2013-08-07 (×6): 10 mg via ORAL
  Filled 2013-08-02 (×6): qty 1

## 2013-08-02 MED ORDER — WHITE PETROLATUM GEL
Status: AC
Start: 2013-08-02 — End: 2013-08-02
  Administered 2013-08-02: 0.2
  Filled 2013-08-02: qty 5

## 2013-08-02 NOTE — Progress Notes (Signed)
Recommend continuing the Novolog correction scale AC & HS.  Could check HgbA1C for home blood glucose control.  Will continue to follow while in hospital.  Smith MinceKendra Bria Portales RN BSN CDE

## 2013-08-02 NOTE — Progress Notes (Signed)
Rehab admissions - I met with patient and explained rehab.  He would like to come to inpatient rehab.  He has [place] and we will need approval from insurance carrier for acute inpatient rehab admission.  Awaiting completion of OT consult.  Will contact [facility] and seek insurance approval.  Call me for questions.  #[phone]

## 2013-08-02 NOTE — Progress Notes (Signed)
NUTRITION FOLLOW UP  Intervention:   -Encouraged PO intake and balanced meals to assist with blood glucose control -Monitor PO intake and supplement as warranted  Nutrition Dx:   1. Inadequate oral intake related to inability to eat as evidenced by NPO status - resolved 2. Inadequate oral intake related to early satiety/constipation as Evidenced by PO intake <75%  Goal:   Pt to meet >/= 90% of their estimated nutrition needs   Monitor:   Total protein/energy intake, labs, weights, Gi profile  Assessment:   1/02: Pt with progressive dyspnea, cough, and hypoxia or 2 to 3 weeks. He was tried on BiPAP, but eventually required intubation. He has hx of GOLD 4 COPD and nocturnal hypoxia, and followed by Dr. Craige CottaSood in pulmonary office.  Patient is currently intubated on ventilator support.  MV: 8.3 L/min  Temp (24hrs), Avg:97.8 F (36.6 C), Min:89.8 F (32.1 C), Max:99.6 F (37.6 C)   Propofol: 13.2 ml/hr providing 348 kcal per day from lipid  1/08: -Pt extubated to Bipap on 1/06 -Diet advanced from clear liquid to Carb Modified on 1/06 -PO intake approximately 25-50%. Is receiving ordering assistance with meals. Encouraged pt to order protein/vegetables with meals to assist in blood glucose control -Prednisone also likely contributing to elevated CBGs -Pt w/ constipation. Last BM on 1/01. MD noted stool softeners will be added. Pt's appetite will likely improve once pt is having regular BMs -Will continue to monitor PO intake and supplement as warranted   Height: Ht Readings from Last 1 Encounters:  07/26/13 5\' 9"  (1.753 m)    Weight Status:   Wt Readings from Last 1 Encounters:  08/01/13 247 lb 9.6 oz (112.311 kg)    Re-estimated needs:  Kcal: 2000-2200 Protein: 100-112 gram Fluid: 2200 ml/daily  Skin: WDL  Diet Order: Carb Control   Intake/Output Summary (Last 24 hours) at 08/02/13 1329 Last data filed at 08/02/13 1143  Gross per 24 hour  Intake    360 ml   Output    530 ml  Net   -170 ml    Last BM: 1/08   Labs:   Recent Labs Lab 07/29/13 0440 07/30/13 0653 07/31/13 0340  NA 139 142 142  K 4.2 5.0 4.7  CL 97 97 94*  CO2 34* 34* 30  BUN 31* 31* 35*  CREATININE 0.86 0.83 0.76  CALCIUM 8.3* 8.6 8.4  GLUCOSE 171* 181* 174*    CBG (last 3)   Recent Labs  08/02/13 0416 08/02/13 0813 08/02/13 1157  GLUCAP 125* 128* 178*    Scheduled Meds: . amLODipine  10 mg Oral Daily  . antiseptic oral rinse  15 mL Mouth Rinse QID  . chlorhexidine  15 mL Mouth Rinse BID  . heparin  5,000 Units Subcutaneous Q8H  . hydrochlorothiazide  12.5 mg Oral Daily  . influenza vac split quadrivalent PF  0.5 mL Intramuscular Tomorrow-1000  . insulin glargine  20 Units Subcutaneous QHS  . ipratropium-albuterol  3 mL Nebulization Q6H  . losartan  100 mg Oral Daily  . multivitamin with minerals  1 tablet Oral Daily  . polyethylene glycol  17 g Oral Daily  . [START ON 08/03/2013] predniSONE  40 mg Oral Q breakfast    Continuous Infusions:  None  Lloyd HugerSarah F Jedediah Noda MS RD LDN Clinical Dietitian Pager:[phone]

## 2013-08-02 NOTE — Progress Notes (Signed)
[facility] PCCM   Name: Robert [name] MRN: [id number] DOB: September 21, 1960    ADMISSION DATE:  07/26/2013  REFERRING MD :  EDP PRIMARY SERVICE: PCCM  CHIEF COMPLAINT:  Shortness of Breath  BRIEF PATIENT DESCRIPTION: 53 yo M, history of GOLD stage 4 COPD, HTN, HL, former tobacco use, presenting with a 2-3 week history of progressive dyspnea, cough, and hypoxia.  Failed trial bipap in ER and required intubation.  Followed by Dr. Craige Cotta as outpatient.   SIGNIFICANT EVENTS / STUDIES:  1/01 - presented with resp distress, likely AECOPD, failed BiPap -> intubated.  Agitation post-intubation, requiring neuromuscular blockade.  Hypotension following that, requiring phenylephrine. 1/02 - d/c'ed nimbex, weaned down propofol and phenylephrine 1/03 - off phenylephrine, self-extubated then re-intubated w/o complication, soft restraints ordered 1/06- extubated to BiPap  LINES / TUBES: ETT 1/1 >> 1/3 self extub/reint >>1/6 L IJ CVL 1/2 >> Foley 1/1 >>1/6 OGT 1/1 >>1/6  CULTURES: Blood cx 1/1 >> neg Resp Viral Panel 1/2 >> neg MRSA: Neg  ANTIBIOTICS: 1/1 Levaquin >>1/7 1/2  oseltamivir >> 1/5  SUBJECTIVE: no acute events overnight C/o hoarseness, mild constipation.  Denies SOB.  Pending CIR consult.   VITAL SIGNS: Temp:  [97.7 F (36.5 C)-99.4 F (37.4 C)] 97.7 F (36.5 C) (01/08 0419) Pulse Rate:  [90-120] 115 (01/08 0419) Resp:  [17-26] 20 (01/08 0419) BP: (137-174)/(83-102) 140/92 mmHg (01/08 0419) SpO2:  [93 %-98 %] 95 % (01/08 0645) Weight:  [247 lb 9.6 oz (112.311 kg)] 247 lb 9.6 oz (112.311 kg) (01/07 2017) HEMODYNAMICS: INTAKE / OUTPUT: Intake/Output     01/07 0701 - 01/08 0700 01/08 0701 - 01/09 0700   P.O. 560    I.V. (mL/kg) 20 (0.2)    NG/GT     IV Piggyback     Total Intake(mL/kg) 580 (5.2)    Urine (mL/kg/hr) 940 (0.3)    Total Output 940     Net -360            PHYSICAL EXAMINATION: General: NAD HEENT: EOMI, NCAT PULM: ctab CV: RRR, no mgr appreciated AB: BS+,  soft, nontender Ext: 2+ LE edema, warm, distal pulses intact Neuro: alert and oriented to self and place, grossly non-focal  LABS:  CBC  Recent Labs Lab 07/29/13 0440 07/30/13 0653 07/31/13 0340  WBC 14.0* 14.7* 13.5*  HGB 13.7 14.4 14.6  HCT 42.6 45.3 45.1  PLT 351 343 366   BMET  Recent Labs Lab 07/29/13 0440 07/30/13 0653 07/31/13 0340  NA 139 142 142  K 4.2 5.0 4.7  CL 97 97 94*  CO2 34* 34* 30  BUN 31* 31* 35*  CREATININE 0.86 0.83 0.76  GLUCOSE 171* 181* 174*   Electrolytes  Recent Labs Lab 07/29/13 0440 07/30/13 0653 07/31/13 0340  CALCIUM 8.3* 8.6 8.4   ABG  Recent Labs Lab 07/26/13 2018 07/27/13 0349 07/30/13 1355  PHART 7.205* 7.347* 7.336*  PCO2ART 98.2* 58.4* 76.5*  PO2ART 390.0* 118.0* 67.0*   Cardiac Enzymes  Recent Labs Lab 07/26/13 1900  TROPONINI <0.30   Glucose  Recent Labs Lab 08/01/13 1158 08/01/13 1538 08/01/13 2016 08/01/13 2358 08/02/13 0416 08/02/13 0813  GLUCAP 200* 181* 135* 131* 125* 128*    Imaging No results found.   ASSESSMENT / PLAN:  PULMONARY A: Acute on Chronic Respiratory Failure AECOPD - lungs clear bilateral lung fields, still with thick yellow secretions P:   -cont prednisone with slow taper over 2 wks -continue duonebs q6 -pulmonary hygiene - add flutter 1/8 -close outpt  pulm f/u  -cont O2  -f/u CXR 1/8  CARDIOVASCULAR A:  Hypotension following intubation - resolved.  Hx HTN  P:  -tele -cont amlodipine, cozaar, HCTZ -- increase norvasc 1/8 with climbing BP   RENAL A:   Acute Kidney Injury - resolved P:   -kvo fluids  -f/u chem    GASTROINTESTINAL A:   History of Hiatal Hernia Nutrition - Constipation - mild  P:   -cont PO diet  -add stool softeners  HEMATOLOGIC A:   No acute issues P:  -heparin for DVT ppx  INFECTIOUS A:   AECOPD - flu swab and cultures all NEG.  P:   -s/p 7 day coarse levaquin  -monitor wbc/fever curve off abx   ENDOCRINE A:    Steroid-induced hyperglycemia > improving  P:   -continue SSI q4 hrs -decrease lantus to 20 daily 1/8 with taper of steroids    NEUROLOGIC A:  Acute encephalopathy secondary to respiratory failure--> resolved P:   -pt/ot   Dispo:: CIR consult pending.  May be able to d/c to CIR in next day or so if approved.    Danford BadWHITEHEART,KATHRYN, NP 08/02/2013  9:20 AM Pager: [phone] or [phone]  *Care during the described time interval was provided by me and/or other providers on the critical care team. I have reviewed this patient's available data, including medical history, events of note, physical examination and test results as part of my evaluation.   Dorcas Carrowatrick WrightMD Beeper  [phone]  Cell  [phone]  If no response or cell goes to voicemail, call beeper [phone]

## 2013-08-03 LAB — CBC
HCT: 52.4 % — ABNORMAL HIGH (ref 39.0–52.0)
Hemoglobin: 16.8 g/dL (ref 13.0–17.0)
MCH: 29.7 pg (ref 26.0–34.0)
MCHC: 32.1 g/dL (ref 30.0–36.0)
MCV: 92.7 fL (ref 78.0–100.0)
PLATELETS: 337 10*3/uL (ref 150–400)
RBC: 5.65 MIL/uL (ref 4.22–5.81)
RDW: 13.9 % (ref 11.5–15.5)
WBC: 12.2 10*3/uL — ABNORMAL HIGH (ref 4.0–10.5)

## 2013-08-03 LAB — BASIC METABOLIC PANEL
BUN: 40 mg/dL — ABNORMAL HIGH (ref 6–23)
CALCIUM: 8.9 mg/dL (ref 8.4–10.5)
CO2: 34 meq/L — AB (ref 19–32)
Chloride: 95 mEq/L — ABNORMAL LOW (ref 96–112)
Creatinine, Ser: 0.93 mg/dL (ref 0.50–1.35)
GFR calc Af Amer: >90 mL/min (ref >90)
GFR calc non Af Amer: >90 mL/min (ref >90)
GLUCOSE: 114 mg/dL — AB (ref 70–99)
Potassium: 3.6 mEq/L — ABNORMAL LOW (ref 3.7–5.3)
Sodium: 142 mEq/L (ref 137–147)

## 2013-08-03 LAB — GLUCOSE, CAPILLARY
GLUCOSE-CAPILLARY: 159 mg/dL — AB (ref 70–99)
GLUCOSE-CAPILLARY: 113 mg/dL — AB (ref 70–99)
Glucose-Capillary: 156 mg/dL — ABNORMAL HIGH (ref 70–99)
Glucose-Capillary: 185 mg/dL — ABNORMAL HIGH (ref 70–99)

## 2013-08-03 NOTE — Progress Notes (Signed)
Physical Therapy Treatment Patient Details Name: Robert [name] MRN: [id number] DOB: Jan 28, 1961 Today's Date: 08/03/2013 Time: 1478-2956 PT Time Calculation (min): 25 min  PT Assessment / Plan / Recommendation  History of Present Illness 53 yo male [place], former smoker presented to ED with progressive dyspnea, cough, and hypoxia or 2 to 3 weeks.  He was tried on BiPAP, but eventually required intubation.   PT Comments   Pt progressing with ambulation today. Is limited in ambulation due to decreased activity tolerance. Pt highly motivated to participate today, even though reporting he was very fatigued and weak from vomiting last night. Pt has difficulty with high level balance activities and is a minimal fall risk due to balance deficits and decreased gt speed. Will cont to recommend CIR for more intense rehab prior to returning home with his wife. Pt stated many times he is determined to be "independent and not rely solely on his wife. She is just too tiny" will cont to follow per POC.  Follow Up Recommendations  CIR;Supervision/Assistance - 24 hour     Does the patient have the potential to tolerate intense rehabilitation     Barriers to Discharge        Equipment Recommendations  Other (comment)    Recommendations for Other Services Rehab consult  Frequency Min 3X/week   Progress towards PT Goals Progress towards PT goals: Progressing toward goals  Plan Current plan remains appropriate    Precautions / Restrictions Precautions Precautions: Fall Restrictions Weight Bearing Restrictions: No   Pertinent Vitals/Pain No complaints. VSS    Mobility  Bed Mobility Overal bed mobility: Modified Independent Bed Mobility: Supine to Sit General bed mobility comments: HOB elevated and relied heavily on handrails; incr time due to weakness  Transfers Overall transfer level: Needs assistance Equipment used: Ambulation equipment used;Rolling walker (2 wheeled) Transfers: Sit to/from  Stand Sit to Stand: Min guard General transfer comment: cues for hand placement and safe technique with use of RW for transfers; relies heavily on handicap rail when transferring to/from lower surface toilet; min guard to steady; incr time to catch his breathe after each transfer  Ambulation/Gait Ambulation/Gait assistance: Min guard Ambulation Distance (Feet): 15 Feet (x2) Assistive device: Rolling walker (2 wheeled) Gait Pattern/deviations: Decreased stride length;Trunk flexed;Wide base of support Gait velocity: decreased Gait velocity interpretation: Below normal speed for age/gender General Gait Details: ceus for upright posture and sequencing with RW; pt ambulates at decreased speed and fatigues quickly; cues for deep breathing exercises; min guard to steady     Exercises General Exercises - Lower Extremity Long Arc Quad: AROM;Strengthening;Both;10 reps;Seated Hip Flexion/Marching: AROM;Strengthening;Both;10 reps;Seated   PT Diagnosis:    PT Problem List:   PT Treatment Interventions:     PT Goals (current goals can now be found in the care plan section) Acute Rehab PT Goals Patient Stated Goal: get stronger before going home  PT Goal Formulation: With patient Time For Goal Achievement: 08/15/13 Potential to Achieve Goals: Good  Visit Information  Last PT Received On: 08/03/13 Assistance Needed: +1 History of Present Illness: 53 yo male [place], former smoker presented to ED with progressive dyspnea, cough, and hypoxia or 2 to 3 weeks.  He was tried on BiPAP, but eventually required intubation.    Subjective Data  Subjective: Pt lying supine; agreeable to therapy. stated "i had a really bad night. the cheese for dinner made the throw up and im just so tired"  Patient Stated Goal: get stronger before going home    Cognition  Cognition Arousal/Alertness: Awake/alert Behavior During Therapy: WFL for tasks assessed/performed;Flat affect Overall Cognitive Status: Within  Functional Limits for tasks assessed    Balance  Balance Overall balance assessment: Needs assistance Sitting-balance support: Feet supported;Bilateral upper extremity supported Sitting balance-Leahy Scale: Good Standing balance support: Bilateral upper extremity supported;During functional activity Standing balance-Leahy Scale: Fair High level balance activites: Direction changes;Turns High Level Balance Comments: has difficulty with high level balance activies (ie reaching to ground for objects or increased weight shifting) min guard to steady   End of Session PT - End of Session Equipment Utilized During Treatment: Gait belt;Oxygen (1 L) Activity Tolerance: Patient tolerated treatment well Patient left: in chair;with call bell/phone within reach Nurse Communication: Mobility status   GP     Robert [name], Robert [name], [place]PT [phone] 08/03/2013, 8:50 AM

## 2013-08-03 NOTE — Progress Notes (Signed)
08/03/2012 foley cath was removed at 1513 and patient had 150cc of yellow, cloudy urine, but no odor noted. [facility] Eye AssociatesNadine Merdis Snodgrass RN.

## 2013-08-03 NOTE — Progress Notes (Addendum)
[facility] PCCM   Name: Robert [name] MRN: [id number] DOB: Mar 16, 1961    ADMISSION DATE:  07/26/2013  REFERRING MD :  EDP PRIMARY SERVICE: PCCM  CHIEF COMPLAINT:  Shortness of Breath  BRIEF PATIENT DESCRIPTION: 53 yo M, history of GOLD stage 4 COPD, HTN, HL, former tobacco use, presenting with a 2-3 week history of progressive dyspnea, cough, and hypoxia.  Failed trial bipap in ER and required intubation.  Followed by Dr. Craige CottaSood as outpatient.   SIGNIFICANT EVENTS / STUDIES:  1/01 - presented with resp distress, likely AECOPD, failed BiPap -> intubated.  Agitation post-intubation, requiring neuromuscular blockade.  Hypotension following that, requiring phenylephrine. 1/02 - d/c'ed nimbex, weaned down propofol and phenylephrine 1/03 - off phenylephrine, self-extubated then re-intubated w/o complication, soft restraints ordered 1/06- extubated to BiPap  LINES / TUBES: ETT 1/1 >> 1/3 self extub/reint >>1/6 L IJ CVL 1/2 >>OUT Foley 1/1 >>1/6>>1/7>>1/9 OGT 1/1 >>1/6  CULTURES: Blood cx 1/1 >> neg Resp Viral Panel 1/2 >> neg MRSA: Neg  ANTIBIOTICS: 1/1 Levaquin >>1/7 1/2  oseltamivir >> 1/5  SUBJECTIVE: no acute events overnight   VITAL SIGNS: Temp:  [98.4 F (36.9 C)-98.5 F (36.9 C)] 98.5 F (36.9 C) (01/09 1000) Pulse Rate:  [98-111] 98 (01/09 1000) Resp:  [20-22] 20 (01/09 1000) BP: (133-155)/(81-89) 148/84 mmHg (01/09 1000) SpO2:  [94 %-96 %] 96 % (01/09 1000) Weight:  [109.045 kg (240 lb 6.4 oz)-111.698 kg (246 lb 4 oz)] 111.698 kg (246 lb 4 oz) (01/09 0500)  INTAKE / OUTPUT: Intake/Output     01/08 0701 - 01/09 0700 01/09 0701 - 01/10 0700   P.O. 600 240   I.V. (mL/kg)     Total Intake(mL/kg) 600 (5.4) 240 (2.1)   Urine (mL/kg/hr) 850 (0.3)    Stool 5 (0)    Total Output 855     Net -255 +240        Urine Occurrence  1 x   Stool Occurrence  1 x     PHYSICAL EXAMINATION: General: NAD HEENT: EOMI, NCAT PULM: ctab CV: RRR, no mgr appreciated AB: BS+, soft,  nontender Ext: 2+ LE edema, warm, distal pulses intact Neuro: alert and oriented to self and place, grossly non-focal  LABS:  CBC  Recent Labs Lab 07/30/13 0653 07/31/13 0340 08/03/13 0405  WBC 14.7* 13.5* 12.2*  HGB 14.4 14.6 16.8  HCT 45.3 45.1 52.4*  PLT 343 366 337   BMET  Recent Labs Lab 07/30/13 0653 07/31/13 0340 08/03/13 0405  NA 142 142 142  K 5.0 4.7 3.6*  CL 97 94* 95*  CO2 34* 30 34*  BUN 31* 35* 40*  CREATININE 0.83 0.76 0.93  GLUCOSE 181* 174* 114*   Electrolytes  Recent Labs Lab 07/30/13 0653 07/31/13 0340 08/03/13 0405  CALCIUM 8.6 8.4 8.9   ABG  Recent Labs Lab 07/30/13 1355  PHART 7.336*  PCO2ART 76.5*  PO2ART 67.0*   Cardiac Enzymes No results found for this basename: TROPONINI, PROBNP,  in the last 168 hours Glucose  Recent Labs Lab 08/02/13 1157 08/02/13 1650 08/02/13 2003 08/02/13 2347 08/03/13 0818 08/03/13 1126  GLUCAP 178* 142* 162* 113* 156* 185*    Imaging Dg Chest 2 View  08/02/2013   CLINICAL DATA:  Continues shortness of breath  EXAM: CHEST  2 VIEW  COMPARISON:  07/31/2013, 07/30/2013  FINDINGS: Hyperinflation indicate COPD. Heart size and vascular pattern are normal. No effusion. Left lung is clear. On the right, there is mild deformity of the posterior lateral  right 8th rib suggesting prior fracture. There is a faint nodular opacity laterally in the lower lobe which may represent a nipple shadow.  IMPRESSION: COPD. Nodular density right lower lobe laterally may represent nipple shadow. Consider repeating the PA view with nipple markers to confirm this.Alternatively, CT thorax could be considered.   Electronically Signed   By: Esperanza Heir M.D.   On: 08/02/2013 10:10     ASSESSMENT / PLAN:  PULMONARY A: Acute on Chronic Respiratory Failure AECOPD - lungs clear bilateral lung fields, still with thick yellow secretions P:   -cont prednisone with slow taper over 2 wks -continue duonebs q6 -pulmonary hygiene  - add flutter 1/8 -cont O2  -f/u CXR 1/8  CARDIOVASCULAR A:  Hypotension following intubation - resolved.  Hx HTN  P:  -tele -cont amlodipine, cozaar, HCTZ -- increase norvasc 1/8 with climbing BP   RENAL A:   Acute Kidney Injury - resolved P:    -f/u chem    GASTROINTESTINAL A:   History of Hiatal Hernia Nutrition - Constipation - mild  P:   -cont PO diet  -add stool softeners  HEMATOLOGIC A:   No acute issues P:  -heparin for DVT ppx  INFECTIOUS A:   AECOPD - flu swab and cultures all NEG.  P:   -s/p 7 day coarse levaquin  -monitor wbc/fever curve off abx  -get foley out  ENDOCRINE A:   Steroid-induced hyperglycemia > improving  P:   -continue SSI q4 hrs  lantus 20 daily   NEUROLOGIC A:  Acute encephalopathy secondary to respiratory failure--> resolved P:   -pt/ot   Dispo:: CIR consult pending.  May be able to d/c to CIR in next day or so if approved.  Or SNF rehab   Dorcas Carrow Beeper  [phone]  Cell  [phone]  If no response or cell goes to voicemail, call beeper [phone]

## 2013-08-03 NOTE — Progress Notes (Signed)
Rehab admissions - [facility] and am [facility] decision regarding possible acute inpatient rehab admission.  I will follow up on Monday.  Call me for questions.  #[phone]

## 2013-08-03 NOTE — Evaluation (Signed)
Occupational Therapy Evaluation Patient Details Name: Robert [name] MRN: [id number] DOB: 12-04-1960 Today's Date: 08/03/2013 Time: 1000-1043 OT Time Calculation (min): 43 min  OT Assessment / Plan / Recommendation History of present illness 53 yo male Veteran, former smoker presented to ED with progressive dyspnea, cough, and hypoxia or 2 to 3 weeks.  He was tried on BiPAP, but eventually required intubation.   Clinical Impression   Pt. Has decreased tolerance for activity and will need further OT to maximize performance with ADLs and mobility Pt. Becomes SOB with minimal activity and requires resting sit down break. . Pt. Wants to go to CIR vs. SNF for rehab. Pt. Wants to reach maximum performance before d/c home with wife. Pt. Wife can work from home and A pt. As needed.    OT Assessment  Patient needs continued OT Services    Follow Up Recommendations  CIR;SNF    Barriers to Discharge      Equipment Recommendations   (TBA at dc location)    Recommendations for Other Services Rehab consult  Frequency  Min 2X/week    Precautions / Restrictions Precautions Precautions: Fall Restrictions Weight Bearing Restrictions: No   Pertinent Vitals/Pain No c/o    ADL  Eating/Feeding: Simulated;Independent Where Assessed - Eating/Feeding: Chair Grooming: Performed;Wash/dry hands;Min guard Where Assessed - Grooming: Supported standing Upper Body Bathing: Simulated;Set up Where Assessed - Upper Body Bathing: Unsupported sitting Lower Body Bathing: Simulated;Min guard Where Assessed - Lower Body Bathing: Unsupported sitting;Supported standing Upper Body Dressing: Simulated;Set up Where Assessed - Upper Body Dressing: Unsupported sitting Lower Body Dressing: Performed;Min guard Where Assessed - Lower Body Dressing: Unsupported sitting;Supported standing Toilet Transfer: Performed;Minimal assistance Toilet Transfer Method: Stand pivot Toilet Transfer Equipment: Comfort height  toilet;Grab bars Toileting - Clothing Manipulation and Hygiene: Supervision/safety Where Assessed - Glass blower/designer Manipulation and Hygiene: Standing ADL Comments:  (Pt. ed. on AE for LE dressing and will require further ed)    OT Diagnosis: Generalized weakness  OT Problem List: Decreased strength;Decreased activity tolerance;Decreased knowledge of use of DME or AE OT Treatment Interventions: Self-care/ADL training;DME and/or AE instruction;Therapeutic activities   OT Goals(Current goals can be found in the care plan section) Acute Rehab OT Goals Patient Stated Goal: get stronger before going home  OT Goal Formulation: With patient Time For Goal Achievement: 08/17/13 Potential to Achieve Goals: Good ADL Goals Pt Will Perform Grooming: with modified independence Pt Will Perform Lower Body Bathing: with modified independence Pt Will Perform Lower Body Dressing: with modified independence Pt Will Transfer to Toilet: with modified independence  Visit Information  Last OT Received On: 08/03/13 Assistance Needed: +1 History of Present Illness: 53 yo male [place], former smoker presented to ED with progressive dyspnea, cough, and hypoxia or 2 to 3 weeks.  He was tried on BiPAP, but eventually required intubation.       Prior Functioning     Home Living Family/patient expects to be discharged to:: Private residence Living Arrangements: Spouse/significant other Available Help at Discharge: Available PRN/intermittently;Family Type of Home: House Home Access: Level entry Home Layout: Two level;Bed/bath upstairs Alternate Level Stairs-Number of Steps: 15 Alternate Level Stairs-Rails: Right;Left Home Equipment: None Additional Comments:  (Pt. shower is upstairs so he takes sponge bath currently.) Prior Function Level of Independence: Independent Communication Communication: No difficulties         Vision/Perception Vision - History Baseline Vision: Wears glasses only for  reading   Cognition  Cognition Arousal/Alertness: Awake/alert Behavior During Therapy: [facility] for tasks assessed/performed;Flat affect Overall Cognitive  Status: Within Functional Limits for tasks assessed    Extremity/Trunk Assessment Upper Extremity Assessment Upper Extremity Assessment: Overall WFL for tasks assessed     Mobility Bed Mobility Overal bed mobility: Modified Independent Bed Mobility: Supine to Sit General bed mobility comments: HOB elevated and relied heavily on handrails; incr time due to weakness  Transfers Overall transfer level: Needs assistance Equipment used: Ambulation equipment used;Rolling walker (2 wheeled) Transfers: Sit to/from Stand Sit to Stand: Min guard Stand pivot transfers: Min guard General transfer comment:  (Pt. is Min guard A from chair and Min A from commode)     Exercise General Exercises - Lower Extremity Long Arc Quad: AROM;Strengthening;Both;10 reps;Seated Hip Flexion/Marching: AROM;Strengthening;Both;10 reps;Seated   Balance Balance Overall balance assessment: Needs assistance Sitting-balance support: Feet supported;Bilateral upper extremity supported Sitting balance-Leahy Scale: Good Standing balance support: Bilateral upper extremity supported;During functional activity Standing balance-Leahy Scale: Fair High level balance activites: Direction changes;Turns High Level Balance Comments: has difficulty with high level balance activies (ie reaching to ground for objects or increased weight shifting) min guard to steady    End of Session OT - End of Session Equipment Utilized During Treatment: Rolling walker Activity Tolerance: Patient tolerated treatment well Patient left: in chair;with call bell/phone within reach  GO     Michole Lecuyer 08/03/2013, 10:44 AM

## 2013-08-03 NOTE — Progress Notes (Signed)
   CARE MANAGEMENT NOTE 08/03/2013  Patient:  Robert [name],Robert [name]   Account Number:  [id number]  Date Initiated:  07/30/2013  Documentation initiated by:  [facility]OWN,SARAH  Subjective/Objective Assessment:   Admitted with dyspnea - intubated.     Action/Plan:   Anticipated DC Date:  08/06/2013   Anticipated DC Plan:  HOME W HOME HEALTH SERVICES      DC Planning Services  CM consult      Choice offered to / List presented to:             Status of service:  In process, will continue to follow Medicare Important Message given?   (If response is "NO", the following Medicare IM given date fields will be blank) Date Medicare IM given:   Date Additional Medicare IM given:    Discharge Disposition:    Per UR Regulation:  Reviewed for med. necessity/level of care/duration of stay  If discussed at Long Length of Stay Meetings, dates discussed:    Comments:  Contact:  Poyser,Ronda Spouse [phone]  08/03/2013  [address] Rorik Vespa RN, [place]  [phone] [place] [facility], [place] and [place]. If the patient does need any home health, or short term rehab, need to call SW Fuller CanadaQuiana Mock at [phone] for review of services.  NCM advised her we show BCBS still in effect and primary.  Spoke with patient regarding insurance, his wife still has [employer] and [place].  Financial Counselor Merry ProudBrandi  [phone]. They had precerted and verified his BCBS at time of admission, [facility] is secondary  08/03/2013  [address] Serai Tukes RN, [place]  [phone] CIR evaluation in process.  [facility] called left voice mail message regarding plan of care for patient Call to April: left voice mail message patient is being evaluated for CIR  08-01-13 8:30am  Avie ArenasSarah Brown, RNBSN [phone]- [phone] Extubated 1-6 - palliative consulted but husband changed mind and did not want to talk with them.  Remains on oxygen [place] - for PT eval today.

## 2013-08-04 DIAGNOSIS — R1013 Epigastric pain: Secondary | ICD-10-CM

## 2013-08-04 DIAGNOSIS — K3189 Other diseases of stomach and duodenum: Secondary | ICD-10-CM

## 2013-08-04 DIAGNOSIS — K30 Functional dyspepsia: Secondary | ICD-10-CM

## 2013-08-04 LAB — GLUCOSE, CAPILLARY
GLUCOSE-CAPILLARY: 107 mg/dL — AB (ref 70–99)
GLUCOSE-CAPILLARY: 165 mg/dL — AB (ref 70–99)
GLUCOSE-CAPILLARY: 141 mg/dL — AB (ref 70–99)
GLUCOSE-CAPILLARY: 127 mg/dL — AB (ref 70–99)

## 2013-08-04 MED ORDER — ALUM & MAG HYDROXIDE-SIMETH 200-200-20 MG/5ML PO SUSP
30.0000 mL | ORAL | Status: DC | PRN
  Administered 2013-08-04 – 2013-08-06 (×2): 30 mL via ORAL
  Filled 2013-08-04 (×2): qty 30

## 2013-08-04 NOTE — Progress Notes (Signed)
Pt with emesis x 1 and reports that it is due to burping from indigestion. Page to Dr. Darrick Pennaeterding with call back for notification and request for medication to treat indigestion. N.O to Epic. Dondra SpryMoore, Ruhan Borak Islee, RN

## 2013-08-04 NOTE — Progress Notes (Signed)
[facility] PCCM   Name: Edwyn Inclan MRN: [id number] DOB: [birth year]    ADMISSION DATE:  07/26/2013  REFERRING MD :  EDP PRIMARY SERVICE: PCCM  CHIEF COMPLAINT:  Shortness of Breath  BRIEF PATIENT DESCRIPTION: 53 yo M, history of GOLD stage 4 COPD, HTN, HL, former tobacco use, presenting with a 2-3 week history of progressive dyspnea, cough, and hypoxia.  Failed trial bipap in ER and required intubation.  Followed by Dr. Craige Cotta as outpatient.   SIGNIFICANT EVENTS / STUDIES:  1/01 - presented with resp distress, likely AECOPD, failed BiPap -> intubated.  Agitation post-intubation, requiring neuromuscular blockade.  Hypotension following that, requiring phenylephrine. 1/02 - d/c'ed nimbex, weaned down propofol and phenylephrine 1/03 - off phenylephrine, self-extubated then re-intubated w/o complication, soft restraints ordered 1/06- extubated to BiPap  LINES / TUBES: ETT 1/1 >> 1/3 self extub/reint >>1/6 L IJ CVL 1/2 >>OUT Foley 1/1 >>1/6>>1/7>>1/9 OGT 1/1 >>1/6  CULTURES: Blood cx 1/1 >> neg Resp Viral Panel 1/2 >> neg MRSA: Neg  ANTIBIOTICS: 1/1 Levaquin >>1/7 1/2  oseltamivir >> 1/5  SUBJECTIVE: [place] indigestion at night, relieved adequately by Maalox. Still scant sputum.   VITAL SIGNS: Temp:  [98.5 F (36.9 C)-99 F (37.2 C)] 98.6 F (37 C) (01/10 0500) Pulse Rate:  [88-102] 91 (01/10 0500) Resp:  [18-20] 18 (01/10 0500) BP: (112-148)/(74-84) 147/84 mmHg (01/10 0500) SpO2:  [93 %-98 %] 94 % (01/10 0731) Weight:  [109.181 kg (240 lb 11.2 oz)] 109.181 kg (240 lb 11.2 oz) (01/10 0500)  INTAKE / OUTPUT: Intake/Output     01/09 0701 - 01/10 0700 01/10 0701 - 01/11 0700   P.O. 720    Total Intake(mL/kg) 720 (6.6)    Urine (mL/kg/hr) 1450 (0.6)    Stool 1 (0)    Total Output 1451     Net -731          Stool Occurrence 2 x      PHYSICAL EXAMINATION: General: NAD HEENT: EOMI, NCAT PULM: ctab. Breathless with short sentences, but not acutely uncomfortable on O2 CV:  RRR, no mgr appreciated AB: BS+, soft, nontender Ext:1- 2+ LE edema, warm, distal pulses intact Neuro: alert and oriented to self and place, grossly non-focal  LABS:  CBC  Recent Labs Lab 07/30/13 0653 07/31/13 0340 08/03/13 0405  WBC 14.7* 13.5* 12.2*  HGB 14.4 14.6 16.8  HCT 45.3 45.1 52.4*  PLT 343 366 337   BMET  Recent Labs Lab 07/30/13 0653 07/31/13 0340 08/03/13 0405  NA 142 142 142  K 5.0 4.7 3.6*  CL 97 94* 95*  CO2 34* 30 34*  BUN 31* 35* 40*  CREATININE 0.83 0.76 0.93  GLUCOSE 181* 174* 114*   Electrolytes  Recent Labs Lab 07/30/13 0653 07/31/13 0340 08/03/13 0405  CALCIUM 8.6 8.4 8.9   ABG  Recent Labs Lab 07/30/13 1355  PHART 7.336*  PCO2ART 76.5*  PO2ART 67.0*   Cardiac Enzymes No results found for this basename: TROPONINI, PROBNP,  in the last 168 hours Glucose  Recent Labs Lab 08/02/13 2347 08/03/13 0818 08/03/13 1126 08/03/13 1655 08/03/13 2224 08/04/13 0749  GLUCAP 113* 156* 185* 159* 113* 107*    Imaging Dg Chest 2 View  08/02/2013   CLINICAL DATA:  Continues shortness of breath  EXAM: CHEST  2 VIEW  COMPARISON:  07/31/2013, 07/30/2013  FINDINGS: Hyperinflation indicate COPD. Heart size and vascular pattern are normal. No effusion. Left lung is clear. On the right, there is mild deformity of the posterior lateral right  8th rib suggesting prior fracture. There is a faint nodular opacity laterally in the lower lobe which may represent a nipple shadow.  IMPRESSION: COPD. Nodular density right lower lobe laterally may represent nipple shadow. Consider repeating the PA view with nipple markers to confirm this.Alternatively, CT thorax could be considered.   Electronically Signed   By: Esperanza Heiraymond  Rubner M.D.   On: 08/02/2013 10:10     ASSESSMENT / PLAN:  PULMONARY A: Acute on Chronic Respiratory Failure AECOPD - lungs clear bilateral lung fields, still with thick yellow secretions P:   -cont prednisone with slow taper over 2  wks -continue duonebs q6 -pulmonary hygiene - add flutter 1/8 -cont O2  -f/u CXR 1/8 -encourage up out of bed  CARDIOVASCULAR A:  Hypotension following intubation - resolved.  Hx HTN  P:  -tele -cont amlodipine, cozaar, HCTZ -- increased norvasc 1/8 with climbing BP   RENAL A:   Acute Kidney Injury - resolved K drifting down P:    -f/u chem    GASTROINTESTINAL A:   History of Hiatal Hernia Nutrition - Constipation - mild  Indigestion/ gas P:   -cont PO diet  -add stool softeners -Maalox  HEMATOLOGIC A:   No acute issues P:  -heparin for DVT ppx  INFECTIOUS A:   AECOPD - flu swab and cultures all NEG.  P:   -s/p 7 day coarse levaquin  -monitor wbc/fever curve off abx    ENDOCRINE A:   Steroid-induced hyperglycemia > improving  P:   -continue SSI q4 hrs  lantus 20 daily   NEUROLOGIC A:  Acute encephalopathy secondary to respiratory failure--> resolved P:   -pt/ot   Dispo:: CIR consult pending.  May be able to d/c to CIR in next day or so if approved.  Or SNF rehab   [facility] Center[facility] DMD Beeper  [phone]  Cell [phone]  If no response or cell goes to voicemail, call beeper [phone]

## 2013-08-05 LAB — BASIC METABOLIC PANEL
BUN: 29 mg/dL — ABNORMAL HIGH (ref 6–23)
CALCIUM: 9 mg/dL (ref 8.4–10.5)
CO2: 31 mEq/L (ref 19–32)
Chloride: 90 mEq/L — ABNORMAL LOW (ref 96–112)
Creatinine, Ser: 0.75 mg/dL (ref 0.50–1.35)
GFR calc non Af Amer: >90 mL/min (ref >90)
Glucose, Bld: 109 mg/dL — ABNORMAL HIGH (ref 70–99)
POTASSIUM: 4 meq/L (ref 3.7–5.3)
Sodium: 133 mEq/L — ABNORMAL LOW (ref 137–147)

## 2013-08-05 LAB — GLUCOSE, CAPILLARY
GLUCOSE-CAPILLARY: 122 mg/dL — AB (ref 70–99)
GLUCOSE-CAPILLARY: 179 mg/dL — AB (ref 70–99)
Glucose-Capillary: 139 mg/dL — ABNORMAL HIGH (ref 70–99)
Glucose-Capillary: 116 mg/dL — ABNORMAL HIGH (ref 70–99)

## 2013-08-05 NOTE — Progress Notes (Signed)
[facility] PCCM   Name: Robert [name] MRN: [id number] DOB: 12/29/1960    ADMISSION DATE:  07/26/2013  REFERRING MD :  EDP PRIMARY SERVICE: PCCM  CHIEF COMPLAINT:  Shortness of Breath  BRIEF PATIENT DESCRIPTION: 53 yo M, history of GOLD stage 4 COPD, HTN, HL, former tobacco use, presenting with a 2-3 week history of progressive dyspnea, cough, and hypoxia.  Failed trial bipap in ER and required intubation.  Followed by Dr. Craige CottaSood as outpatient.   SIGNIFICANT EVENTS / STUDIES:  1/01 - presented with resp distress, likely AECOPD, failed BiPap -> intubated.  Agitation post-intubation, requiring neuromuscular blockade.  Hypotension following that, requiring phenylephrine. 1/02 - d/c'ed nimbex, weaned down propofol and phenylephrine 1/03 - off phenylephrine, self-extubated then re-intubated w/o complication, soft restraints ordered 1/06- extubated to BiPap  LINES / TUBES: ETT 1/1 >> 1/3 self extub/reint >>1/6 L IJ CVL 1/2 >>OUT Foley 1/1 >>1/6>>1/7>>1/9 OGT 1/1 >>1/6  CULTURES: Blood cx 1/1 >> neg Resp Viral Panel 1/2 >> neg MRSA: Neg  ANTIBIOTICS: 1/1 Levaquin >>1/7 1/2  oseltamivir >> 1/5  SUBJECTIVE: Says he is still more DOE than baseline. Scant clear phlegm.    VITAL SIGNS: Temp:  [97.2 F (36.2 C)-98.9 F (37.2 C)] 97.7 F (36.5 C) (01/11 0500) Pulse Rate:  [91-100] 93 (01/11 0500) Resp:  [20-24] 20 (01/11 0500) BP: (112-135)/(68-79) 121/79 mmHg (01/11 0500) SpO2:  [92 %-97 %] 94 % (01/11 0823) Weight:  [109.453 kg (241 lb 4.8 oz)] 109.453 kg (241 lb 4.8 oz) (01/11 0500)  INTAKE / OUTPUT: Intake/Output     01/10 0701 - 01/11 0700 01/11 0701 - 01/12 0700   P.O. 1080    Total Intake(mL/kg) 1080 (9.9)    Urine (mL/kg/hr) 250 (0.1)    Stool     Total Output 250     Net +830          Urine Occurrence 1 x    Stool Occurrence 1 x      PHYSICAL EXAMINATION: General: NAD, up in chair eating HEENT: EOMI, NCAT PULM: ctab. Breathless with short sentences, but not acutely  uncomfortable on O2 CV: RRR, no mgr appreciated AB: BS+,  Ext: 1- 2+ LE edema, warm, distal pulses intact Neuro: alert and oriented to self and place, grossly non-focal  LABS:  CBC  Recent Labs Lab 07/30/13 0653 07/31/13 0340 08/03/13 0405  WBC 14.7* 13.5* 12.2*  HGB 14.4 14.6 16.8  HCT 45.3 45.1 52.4*  PLT 343 366 337   BMET  Recent Labs Lab 07/31/13 0340 08/03/13 0405 08/05/13 0354  NA 142 142 133*  K 4.7 3.6* 4.0  CL 94* 95* 90*  CO2 30 34* 31  BUN 35* 40* 29*  CREATININE 0.76 0.93 0.75  GLUCOSE 174* 114* 109*   Electrolytes  Recent Labs Lab 07/31/13 0340 08/03/13 0405 08/05/13 0354  CALCIUM 8.4 8.9 9.0   ABG  Recent Labs Lab 07/30/13 1355  PHART 7.336*  PCO2ART 76.5*  PO2ART 67.0*   Cardiac Enzymes No results found for this basename: TROPONINI, PROBNP,  in the last 168 hours Glucose  Recent Labs Lab 08/03/13 2224 08/04/13 0749 08/04/13 1124 08/04/13 1638 08/04/13 2105 08/05/13 0752  GLUCAP 113* 107* 165* 141* 127* 122*    Imaging No results found.   ASSESSMENT / PLAN:  PULMONARY A: Acute on Chronic Respiratory Failure AECOPD - lungs clear bilateral lung fields, scant clear phlegm P:   -cont prednisone with slow taper over 2 wks -continue duonebs q6 -pulmonary hygiene - add flutter  1/8 -cont O2  -f/u CXR 1/8 -encourage up out of bed  CARDIOVASCULAR A:  Hypotension following intubation - resolved.  Hx HTN  P:  -tele -cont amlodipine, cozaar, HCTZ -- increased norvasc 1/8 with climbing BP   RENAL A:   Acute Kidney Injury - resolved  P:   -f/u chem intermittently   GASTROINTESTINAL A:   History of Hiatal Hernia Nutrition - Constipation - mild  Indigestion/ gas P:   -cont PO diet  -add stool softeners -Maalox  HEMATOLOGIC A:   No acute issues P:  -heparin for DVT ppx  INFECTIOUS A:   AECOPD - flu swab and cultures all NEG.  P:   -s/p 7 day coarse levaquin  -monitor wbc/fever curve off abx     ENDOCRINE A:   Steroid-induced hyperglycemia > improving  P:   -continue SSI q4 hrs  lantus 20 daily   NEUROLOGIC A:  Acute encephalopathy secondary to respiratory failure--> resolved P:   -pt/ot   Dispo:: CIR consult pending.  May be able to d/c to CIR in next day or so if approved.  Or SNF rehab   [facility] DMD Beeper  [phone]  Cell [phone]  If no response or cell goes to voicemail, call beeper [phone]

## 2013-08-06 LAB — GLUCOSE, CAPILLARY
GLUCOSE-CAPILLARY: 114 mg/dL — AB (ref 70–99)
Glucose-Capillary: 130 mg/dL — ABNORMAL HIGH (ref 70–99)
Glucose-Capillary: 127 mg/dL — ABNORMAL HIGH (ref 70–99)
Glucose-Capillary: 134 mg/dL — ABNORMAL HIGH (ref 70–99)

## 2013-08-06 MED ORDER — PREDNISONE 20 MG PO TABS
30.0000 mg | ORAL_TABLET | Freq: Every day | ORAL | Status: DC
  Administered 2013-08-07: 09:00:00 30 mg via ORAL
  Filled 2013-08-06 (×2): qty 1

## 2013-08-06 MED ORDER — INFLUENZA VAC SPLIT QUAD 0.5 ML IM SUSP
0.5000 mL | Freq: Once | INTRAMUSCULAR | Status: AC
  Administered 2013-08-06: 0.5 mL via INTRAMUSCULAR
  Filled 2013-08-06: qty 0.5

## 2013-08-06 MED ORDER — INSULIN GLARGINE 100 UNIT/ML ~~LOC~~ SOLN
15.0000 [IU] | Freq: Every day | SUBCUTANEOUS | Status: DC
  Administered 2013-08-06: 15 [IU] via SUBCUTANEOUS
  Filled 2013-08-06 (×2): qty 0.15

## 2013-08-06 NOTE — Progress Notes (Signed)
08/06/2012 influenza vaccine given at 1439 in left deltoid. Expire 01/22/2014 and lot #: [id number]. [facility] Hospital[facility]ad[facility] Elma Limas RN.

## 2013-08-06 NOTE — Discharge Summary (Signed)
Physician Discharge Summary  Patient ID: Robert [name] MRN: [id number] DOB/AGE: [birth year] 53 y.o.  Admit date: 07/26/2013 Discharge date: 08/07/2013    Discharge Diagnoses:  Principal Problem:   Acute-on-chronic respiratory failure Active Problems:   COPD with emphysema   Obstructive chronic bronchitis with exacerbation   Acid indigestion    Brief Summary: Robert [name] is a 53 y.o. y/o male with a PMH of Gold 4 COPD with nocturnal hypoxia and HTN, presented 1/1 with 2-3 week hx SOB, cough.  Failed bipap in ER requiring emergent intubation.  He transiently required paralytic r/t vent dyssynchrony as well as brief pressors. He was treated with IV abx, IV steroids, nebulized BD's, empiric tamiflu.  All cultures and respiratory virus panel were ess negative, tamiflu was stopped 1/5.  He completed a 7 day course levaquin.   He self-extubated on 1/3 but did not maintain adequate respiratory status and was quickly reintubated without complication.  He was successfully extubated 1/6.  Respiratory status continues to improve slowly but he has cont difficulties with DOE and overall weakness and deconditioning.  He is ready for d/c to CIR for further rehab efforts.     SIGNIFICANT EVENTS / STUDIES:  1/01 - presented with resp distress, likely AECOPD, failed BiPap -> intubated. Agitation post-intubation, requiring neuromuscular blockade. Hypotension following that, requiring phenylephrine.  1/02 - d/c'ed nimbex, weaned down propofol and phenylephrine  1/03 - off phenylephrine, self-extubated then re-intubated w/o complication, soft restraints ordered  1/06- extubated to BiPap   LINES / TUBES:  ETT 1/1 >> 1/3 self extub/reint >>1/6  L IJ CVL 1/2 >>OUT  Foley 1/1 >>1/6>>1/7>>1/9  OGT 1/1 >>1/6   CULTURES:  Blood cx 1/1 >> neg  Resp Viral Panel 1/2 >> neg  MRSA: Neg   ANTIBIOTICS:  1/1 Levaquin >>1/7  1/2 oseltamivir >> 1/5                                                                     Hospital Summary by Discharge Diagnosis Acute on Chronic Respiratory Failure  AECOPD - still much more DOE than baseline but overall much improved.  D/c plan -  -Close outpt f/u as below  -cont prednisone with slow taper over 2 wks -- decrease to $RemoveBef'30mg'ewpBKeAmqN$  beginning 1/13  -resume spiriva  -pulmonary hygiene  -cont O2 at 2 liters at night, not needed in day-time did not desaturate w/ activity. Completes full sentences w/out dyspnea while walking in hall w/ rolling walker  -aggressive pt/ot   Hypotension following intubation - resolved.  Hx HTN  D/c plan  -cont amlodipine, cozaar, HCTZ  Acute Kidney Injury - resolved D/c plan -  -intermittent f/u chem  Steroid-induced hyperglycemia > improving  D/c plan  Taper steroids to off, will not d/c on insulin    Filed Vitals:   08/06/13 2310 08/07/13 0610 08/07/13 0725 08/07/13 0821  BP:  134/85 133/77   Pulse:  92 84   Temp:  98.5 F (36.9 C) 97.9 F (36.6 C)   TempSrc:  Oral Oral   Resp:  20 20   Height:      Weight: 109.8 kg (242 lb 1 oz)     SpO2:  94% 96% 96%     Discharge Labs  BMET  Recent  Labs Lab 08/03/13 0405 08/05/13 0354 08/07/13 0625  NA 142 133* 133*  K 3.6* 4.0 4.3  CL 95* 90* 92*  CO2 34* 31 31  GLUCOSE 114* 109* 109*  BUN 40* 29* 21  CREATININE 0.93 0.75 0.74  CALCIUM 8.9 9.0 8.5     CBC   Recent Labs Lab 08/03/13 0405  HGB 16.8  HCT 52.4*  WBC 12.2*  PLT 337   Anti-Coagulation No results found for this basename: INR,  in the last 168 hours    Discharge Orders   Future Appointments Provider Department Dept Phone   08/20/2013 11:30 AM Melvenia Needles, NP [facility] [phone]   Future Orders Complete By Expires   Diet - low sodium heart healthy  As directed    Diet - low sodium heart healthy  As directed    Increase activity slowly  As directed    Increase activity slowly  As directed            Follow-up Information   Follow up with PARRETT,TAMMY, NP On  08/20/2013. (11:30am - Dr. Juanetta Gosling Nurse Practitioner )    Specialty:  Nurse Practitioner   Contact information:   [address] [place] [address] [phone]          Medication List         albuterol 108 (90 BASE) MCG/ACT inhaler  Commonly known as:  PROVENTIL HFA;VENTOLIN HFA  Inhale 2 puffs into the lungs every 6 (six) hours as needed for wheezing or shortness of breath.     amLODipine 10 MG tablet  Commonly known as:  NORVASC  Take 5 mg by mouth daily.     atorvastatin 80 MG tablet  Commonly known as:  LIPITOR  Take 40 mg by mouth at bedtime.     budesonide-formoterol 160-4.5 MCG/ACT inhaler  Commonly known as:  SYMBICORT  Inhale 2 puffs into the lungs 2 (two) times daily.     cetirizine 10 MG tablet  Commonly known as:  ZYRTEC  Take 10 mg by mouth daily.     Fish Oil 1000 MG Caps  Take 1,000 mg by mouth 2 (two) times daily.     flunisolide 25 MCG/ACT (0.025%) Soln  Commonly known as:  NASALIDE  Place 2 sprays into the nose daily as needed (allergies).     hydrochlorothiazide 25 MG tablet  Commonly known as:  HYDRODIURIL  Take 12.5 mg by mouth daily.     hydrochlorothiazide 12.5 MG capsule  Commonly known as:  MICROZIDE  Take 1 capsule (12.5 mg total) by mouth daily.     HYDROcodone-acetaminophen 5-325 MG per tablet  Commonly known as:  NORCO/VICODIN  Take 1 tablet by mouth 3 (three) times daily as needed for moderate pain.     losartan 100 MG tablet  Commonly known as:  COZAAR  Take 100 mg by mouth daily.     pantoprazole 40 MG tablet  Commonly known as:  PROTONIX  Take 40 mg by mouth 2 (two) times daily.     predniSONE 10 MG tablet  Commonly known as:  DELTASONE  Take 3 qdx4d, then 2 qdx4d, then 1qdx4d and dc     tiotropium 18 MCG inhalation capsule  Commonly known as:  SPIRIVA  Place 1 capsule (18 mcg total) into inhaler and inhale daily.     urea 20 % cream  Commonly known as:  CARMOL  Apply 1 application topically daily as needed  (rash).     Vitamin  D3 2000 UNITS Tabs  Take 2,000 Units by mouth daily.          Disposition: home w/ home health   Discharged Condition: Savio Albrecht has met maximum benefit of inpatient care and is medically stable and cleared for discharge.  Patient is pending follow up as above.      Time spent on disposition:  Greater than 35 minutes.   SignedMarni Griffon, NP 08/07/2013  12:50 PM Pager: [phone] or [phone]   STAFF NOTE *Care during the described time interval was provided by me and/or other providers on the critical care team. I have reviewed this patient's available data, including medical history, events of note, physical examination and test results as part of my evaluation. Discharged 08/07/13. > 30 min dc planning  Dr. Brand Males, M.D., [facility].C.P Pulmonary and Critical Care Medicine Staff Physician [place] [facility] Pager: [phone], If no answer or between  15:00h - 7:00h: call [phone]  08/14/2013 11:14 AM

## 2013-08-06 NOTE — Progress Notes (Signed)
[facility] PCCM   Name: Robert [name] MRN: [id number] DOB: 09-04-1960    ADMISSION DATE:  07/26/2013  REFERRING MD :  EDP PRIMARY SERVICE: PCCM  CHIEF COMPLAINT:  Shortness of Breath  BRIEF PATIENT DESCRIPTION: 53 yo M, history of GOLD stage 4 COPD, HTN, HL, former tobacco use, presenting with a 2-3 week history of progressive dyspnea, cough, and hypoxia.  Failed trial bipap in ER and required intubation.  Followed by Dr. Craige Cotta as outpatient.   SIGNIFICANT EVENTS / STUDIES:  1/01 - presented with resp distress, likely AECOPD, failed BiPap -> intubated.  Agitation post-intubation, requiring neuromuscular blockade.  Hypotension following that, requiring phenylephrine. 1/02 - d/c'ed nimbex, weaned down propofol and phenylephrine 1/03 - off phenylephrine, self-extubated then re-intubated w/o complication, soft restraints ordered 1/06- extubated to BiPap  LINES / TUBES: ETT 1/1 >> 1/3 self extub/reint >>1/6 L IJ CVL 1/2 >>OUT Foley 1/1 >>1/6>>1/7>>1/9 OGT 1/1 >>1/6  CULTURES: Blood cx 1/1 >> neg Resp Viral Panel 1/2 >> neg MRSA: Neg  ANTIBIOTICS: 1/1 Levaquin >>1/7 1/2  oseltamivir >> 1/5  SUBJECTIVE:  SOB improving at rest but remains very SOB with pt/ot.  Cough with yellow sputum.   VITAL SIGNS: Temp:  [97.9 F (36.6 C)-98.7 F (37.1 C)] 98.2 F (36.8 C) (01/12 0809) Pulse Rate:  [92-100] 100 (01/12 0809) Resp:  [18-20] 20 (01/12 0809) BP: (115-134)/(70-82) 115/81 mmHg (01/12 0809) SpO2:  [92 %-97 %] 97 % (01/12 0809) Weight:  [240 lb 1.6 oz (108.909 kg)] 240 lb 1.6 oz (108.909 kg) (01/12 0500)  INTAKE / OUTPUT: Intake/Output     01/11 0701 - 01/12 0700 01/12 0701 - 01/13 0700   P.O. 120 240   Total Intake(mL/kg) 120 (1.1) 240 (2.2)   Urine (mL/kg/hr)     Total Output       Net +120 +240        Urine Occurrence 2 x    Stool Occurrence 2 x      PHYSICAL EXAMINATION: General: NAD, up in chair  HEENT: EOMI, NCAT PULM: diminished bases, otherwise clear.  Breathless  with short sentences, but not acutely uncomfortable on O2 CV: RRR, no mgr appreciated AB: BS+,  Ext: 1- 2+ LE edema, warm, distal pulses intact Neuro: alert and oriented to self and place, grossly non-focal  LABS:  CBC  Recent Labs Lab 07/31/13 0340 08/03/13 0405  WBC 13.5* 12.2*  HGB 14.6 16.8  HCT 45.1 52.4*  PLT 366 337   BMET  Recent Labs Lab 07/31/13 0340 08/03/13 0405 08/05/13 0354  NA 142 142 133*  K 4.7 3.6* 4.0  CL 94* 95* 90*  CO2 30 34* 31  BUN 35* 40* 29*  CREATININE 0.76 0.93 0.75  GLUCOSE 174* 114* 109*   Electrolytes  Recent Labs Lab 07/31/13 0340 08/03/13 0405 08/05/13 0354  CALCIUM 8.4 8.9 9.0   ABG  Recent Labs Lab 07/30/13 1355  PHART 7.336*  PCO2ART 76.5*  PO2ART 67.0*   Cardiac Enzymes No results found for this basename: TROPONINI, PROBNP,  in the last 168 hours Glucose  Recent Labs Lab 08/04/13 2105 08/05/13 0752 08/05/13 1132 08/05/13 1742 08/05/13 2257 08/06/13 0759  GLUCAP 127* 122* 179* 139* 116* 114*    Imaging No results found.   ASSESSMENT / PLAN:  PULMONARY A: Acute on Chronic Respiratory Failure AECOPD - still much more DOE than baseline but overall much improved.  P:   -cont prednisone with slow taper over 2 wks -- decrease to 30mg  beginning 1/13 -continue duonebs  q6 -pulmonary hygiene  -cont O2  -encourage up out of bed  CARDIOVASCULAR A:  Hypotension following intubation - resolved.  Hx HTN  P:  -tele -cont amlodipine, cozaar, HCTZ -- increased norvasc 1/8 with climbing BP   RENAL A:   Acute Kidney Injury - resolved  P:   -f/u chem intermittently   GASTROINTESTINAL A:   History of Hiatal Hernia Nutrition - Constipation - mild  Indigestion/ gas P:   -cont PO diet  -cont stool softeners -Maalox  HEMATOLOGIC A:   No acute issues P:  -heparin for DVT ppx  INFECTIOUS A:   AECOPD - flu swab and cultures all NEG.  P:   -s/p 7 day coarse levaquin  -monitor wbc/fever  curve off abx    ENDOCRINE A:   Steroid-induced hyperglycemia > improving  P:   -continue SSI q4 hrs -decrease lantus to 15 units daily 1/12 with improved CBG's and tapering steroids   NEUROLOGIC A:  Acute encephalopathy secondary to respiratory failure--> resolved P:   -pt/ot   Dispo: still awaiting insurance approval for CIR but this remains dispo plan for now.  Ready for d/c to CIR at any time pending insurance ok.    Danford BadWHITEHEART,KATHRYN, NP 08/06/2013  11:02 AM Pager: [phone] or [phone]  STAFF NOTe He is now walking between bath and bed without walker but gets very dyspneic talking. Awaiting inusrance clearance to rehab    Dr. Kalman ShanMurali Melane Windholz, M.D., [facility].C.P Pulmonary and Critical Care Medicine Staff Physician [facility] [place] [facility] Pager: [phone], If no answer or between  15:00h - 7:00h: call [phone]  08/06/2013 2:33 PM

## 2013-08-06 NOTE — Progress Notes (Signed)
Physical Therapy Treatment Patient Details Name: Robert [name] MRN: [id number] DOB: 11-08-1960 Today's Date: 08/06/2013 Time: [id number] PT Time Calculation (min): 23 min  PT Assessment / Plan / Recommendation  History of Present Illness 53 yo male [place]eter[place], former smoker presented to ED with progressive dyspnea, cough, and hypoxia or 2 to 3 weeks.  He was tried on BiPAP, but eventually required intubation.   PT Comments   Patient continues to be limited by dyspnea with mobility.  Making improvements with mobility and gait.  Agree with need for CIR for comprehensive therapies prior to return home.   Follow Up Recommendations  CIR;Supervision/Assistance - 24 hour     Does the patient have the potential to tolerate intense rehabilitation     Barriers to Discharge        Equipment Recommendations  Rolling walker with 5" wheels    Recommendations for Other Services Rehab consult  Frequency Min 3X/week   Progress towards PT Goals Progress towards PT goals: Progressing toward goals  Plan Current plan remains appropriate    Precautions / Restrictions Precautions Precautions: Fall Restrictions Weight Bearing Restrictions: No   Pertinent Vitals/Pain O2 sats at 91% with short ambulation on room air.  HR increased to 112.    Mobility  Transfers Overall transfer level: Needs assistance Equipment used: Rolling walker (2 wheeled) Transfers: Sit to/from Stand Sit to Stand: Min guard General transfer comment: Assist to move to standing from low chair due to unsteadiness.   Verbal cues for safe use of RW. Ambulation/Gait Ambulation/Gait assistance: Min guard Ambulation Distance (Feet): 60 Feet Assistive device: Rolling walker (2 wheeled) Gait Pattern/deviations: Step-through pattern;Decreased stride length;Trunk flexed;Wide base of support Gait velocity: decreased General Gait Details: Verbal cues for safe use of RW and to stand upright.  Cues to move at slow pace to minimize dyspnea.     Exercises General Exercises - Upper Extremity Shoulder Flexion: AROM;Both;10 reps;Seated Shoulder Horizontal ABduction: AROM;Both;10 reps;Seated Shoulder Horizontal ADduction: AROM;Both;10 reps;Seated Chair Push Up: AROM;Both;10 reps;Seated General Exercises - Lower Extremity Long Arc Quad: AROM;Both;10 reps;Seated Hip Flexion/Marching: AROM;Both;10 reps;Seated Toe Raises: AROM;Both;10 reps;Seated Heel Raises: AROM;Both;10 reps;Seated    PT Goals (current goals can now be found in the care plan section)    Visit Information  Last PT Received On: 08/06/13 Assistance Needed: +1 History of Present Illness: 53 yo male [place]eter[place], former smoker presented to ED with progressive dyspnea, cough, and hypoxia or 2 to 3 weeks.  He was tried on BiPAP, but eventually required intubation.    Subjective Data  Subjective: Patient states he is ready for therapy.  Motivated   Cognition  Cognition Arousal/Alertness: Awake/alert Behavior During Therapy: WFL for tasks assessed/performed;Flat affect Overall Cognitive Status: Within Functional Limits for tasks assessed    Balance  Balance Overall balance assessment: Needs assistance Standing balance support: Bilateral upper extremity supported Standing balance-Leahy Scale: Good High level balance activites: Turns;Direction changes;Head turns High Level Balance Comments: Decreased balance with high level balance activities  End of Session PT - End of Session Equipment Utilized During Treatment: Gait belt;Oxygen Activity Tolerance: Patient limited by fatigue Patient left: in chair;with call bell/phone within reach Nurse Communication: Mobility status   GP     Vena AustriaDavis, Margarete Horace H 08/06/2013, 11:58 AM Durenda HurtSusan H. Renaldo Fiddleravis, PT, [facility]ockM[facility] Acute Rehab Services Pager [phone]

## 2013-08-06 NOTE — Progress Notes (Signed)
Rehab admissions - [facility].  Options will be 1 week in an approved SNF if needed or can receive home health therapies upon discharge.  I will follow up with patient and case manager in am regarding denial.  Call me for questions.  #[phone]

## 2013-08-07 DIAGNOSIS — J9620 Acute and chronic respiratory failure, unspecified whether with hypoxia or hypercapnia: Secondary | ICD-10-CM

## 2013-08-07 LAB — BASIC METABOLIC PANEL
BUN: 21 mg/dL (ref 6–23)
CHLORIDE: 92 meq/L — AB (ref 96–112)
CO2: 31 meq/L (ref 19–32)
CREATININE: 0.74 mg/dL (ref 0.50–1.35)
Calcium: 8.5 mg/dL (ref 8.4–10.5)
GFR calc non Af Amer: >90 mL/min (ref >90)
GLUCOSE: 109 mg/dL — AB (ref 70–99)
POTASSIUM: 4.3 meq/L (ref 3.7–5.3)
Sodium: 133 mEq/L — ABNORMAL LOW (ref 137–147)

## 2013-08-07 LAB — GLUCOSE, CAPILLARY
GLUCOSE-CAPILLARY: 118 mg/dL — AB (ref 70–99)
Glucose-Capillary: 122 mg/dL — ABNORMAL HIGH (ref 70–99)

## 2013-08-07 MED ORDER — HYDROCHLOROTHIAZIDE 12.5 MG PO CAPS: 12.5000 mg | ORAL_CAPSULE | Freq: Every day | ORAL | Status: DC

## 2013-08-07 MED ORDER — PREDNISONE 10 MG PO TABS
ORAL_TABLET | ORAL | Status: DC
Start: 2013-08-07 — End: 2013-10-01

## 2013-08-07 NOTE — Progress Notes (Signed)
Rehab admissions - I spoke with patient and his wife about insurance denial.  If they opt for SNF, it would be very short term 1 week according to [facility].  Otherwise, [employer] will [facility].  Call me for questions.  #[phone]

## 2013-08-07 NOTE — Clinical Social Work Note (Signed)
CSW received consult on 08/06/13 for SNF placement. At that time, patient was accepted to CIR and they were waiting for insurance aprroval from [employer] [place]. Inpatient rehab was denied by insurance but CIR representative advised that one week of SNF would be approved. Nurse case manager talked with patient about home health services vs one week of rehab at SNF, and after talking with his wife, patient will discharge home today with home health services. CSW signing off as [facility].  Genelle BalVanessa Manvi Guilliams, MSW, LCSW [phone]

## 2013-08-07 NOTE — Progress Notes (Signed)
Not a candidate for Cinpatient rehab Home with home PT NP will do dc summary  Dr. Kalman ShanMurali Orlandis Sanden, M.D., [facility].C.C.P Pulmonary and Critical Care Medicine Staff Physician [facility] [place] [facility] Pager: [phone], If no answer or between  15:00h - 7:00h: call [phone]  08/07/2013 11:49 AM

## 2013-08-20 ENCOUNTER — Encounter: Payer: Self-pay | Admitting: Adult Health

## 2013-08-20 ENCOUNTER — Ambulatory Visit (INDEPENDENT_AMBULATORY_CARE_PROVIDER_SITE_OTHER): Payer: BC Managed Care – PPO | Admitting: Adult Health

## 2013-08-20 VITALS — BP 128/74 | HR 91 | Temp 98.4°F | Ht 74.0 in | Wt 248.0 lb

## 2013-08-20 DIAGNOSIS — J438 Other emphysema: Secondary | ICD-10-CM

## 2013-08-20 DIAGNOSIS — J439 Emphysema, unspecified: Secondary | ICD-10-CM

## 2013-08-20 NOTE — Progress Notes (Signed)
   Subjective:    Patient ID: Robert [name], male    DOB: 07-Jul-1961, 53 y.o.   MRN: [id number]  HPI 53 yo former smoker with GOLD 4COPD, and nocturnal hypoxia.  08/20/2013 Post Hospital follow up  Patient returns for a post hospital followup. Patient was admitted January 1 through 08/07/2013 for COPD, exacerbation, complicated by acute on chronic  Hypercarbic/hypoxic  respiratory failure. Patient did require intubation and  mechanical ventilation support. He did have a drop in blood pressure postintubation require brief pressor support. Patient had a slow recovery. He was treated with aggressive IV antibiotics, steroids, and nebulized bronchodilators. Viral panel was negative. He was discharged on prednisone taper. Remains on symbicort and spiriva .  Has finished steroid taper.  Breathing is better but not at baseline.  No fever, hemoptysis, chest pain or orthopnea.  On O2 At bedtime  And act. As needed     Tests: PFT 09/03/10>>FEV1 1.66(42%), FEV1% 37, TLC 7.87(101%), DLCO 92%, no BD  08/07/10 Alpha 1 AT level normal with MM genotype Room air ONO 06/29/11>>Test time 7 hrs 58 min. Baseline SpO2 90.1%, low SpO2 79%, Spent 2 hrs 25 min (30.3%) with SpO2 < 88%. ONO with 2 liters 07/31/11>>Test time 7 hrs 2 min. Mean SpO2 94.6%, low SpO2 84%. Spent 4 min 36 sec with SpO2 < 88%.   Review of Systems Constitutional:   No  weight loss, night sweats,  Fevers, chills, fatigue, or  lassitude.  HEENT:   No headaches,  Difficulty swallowing,  Tooth/dental problems, or  Sore throat,                No sneezing, itching, ear ache, nasal congestion, post nasal drip,   CV:  No chest pain,  Orthopnea, PND, swelling in lower extremities, anasarca, dizziness, palpitations, syncope.   GI  No heartburn, indigestion, abdominal pain, nausea, vomiting, diarrhea, change in bowel habits, loss of appetite, bloody stools.   Resp:    No chest wall deformity  Skin: no rash or lesions.  GU: no dysuria, change in  color of urine, no urgency or frequency.  No flank pain, no hematuria   MS:  No joint pain or swelling.  No decreased range of motion.  No back pain.  Psych:  No change in mood or affect. No depression or anxiety.  No memory loss.          Objective:   Physical Exam GEN: A/Ox3; pleasant ,  HEENT:  /AT,  EACs-clear, TMs-wnl, NOSE-clear, THROAT-clear, no lesions, no postnasal drip or exudate noted.   NECK:  Supple w/ fair ROM; no JVD; normal carotid impulses w/o bruits; no thyromegaly or nodules palpated; no lymphadenopathy.  RESP  Diminished BS in bases no accessory muscle use, no dullness to percussion  CARD:  RRR, no m/r/g  , tr  peripheral edema, pulses intact, no cyanosis or clubbing.  GI:   Soft & nt; nml bowel sounds; no organomegaly or masses detected.  Musco: Warm bil, no deformities or joint swelling noted.   Neuro: alert, no focal deficits noted.    Skin: Warm, no lesions or rashes         Assessment & Plan:

## 2013-08-20 NOTE — Assessment & Plan Note (Addendum)
Recent severe COPD, exacerbation, get up and acute on chronic hypercarbic and hypoxic respiratory failure requiring vent support. Patient is much improved after his prolonged stay at the hospital with aggressive IV antibiotics, steroids, and nebulized bronchodilators. Patient is advised on medication compliance. He is to continue on Symbicort, and Spiriva Continue on nocturnal oxygen. He is encouraged on weight loss. Patient did not have any desaturations with ambulation. Prior to discharge. Says today in the office are 95%.

## 2013-08-20 NOTE — Patient Instructions (Signed)
Continue on current regimen.  Mucinex DM Twice daily  As needed  Cough/congestion  Follow up Dr. Craige CottaSood  In 6 weeks and As needed .

## 2013-08-20 NOTE — Progress Notes (Signed)
Reviewed and agree with assessment and plan.

## 2013-10-01 ENCOUNTER — Encounter: Payer: Self-pay | Admitting: Pulmonary Disease

## 2013-10-01 ENCOUNTER — Ambulatory Visit (INDEPENDENT_AMBULATORY_CARE_PROVIDER_SITE_OTHER): Payer: BC Managed Care – PPO | Admitting: Pulmonary Disease

## 2013-10-01 VITALS — BP 130/86 | HR 94 | Temp 98.4°F | Ht 74.0 in | Wt 255.0 lb

## 2013-10-01 DIAGNOSIS — J439 Emphysema, unspecified: Secondary | ICD-10-CM

## 2013-10-01 DIAGNOSIS — G4736 Sleep related hypoventilation in conditions classified elsewhere: Secondary | ICD-10-CM

## 2013-10-01 DIAGNOSIS — R911 Solitary pulmonary nodule: Secondary | ICD-10-CM

## 2013-10-01 DIAGNOSIS — J438 Other emphysema: Secondary | ICD-10-CM

## 2013-10-01 NOTE — Patient Instructions (Signed)
Follow up in 6 months 

## 2013-10-01 NOTE — Assessment & Plan Note (Signed)
Last CT chest in our system was from 2012.  He quit smoking in 1993.  [facility]u CT [facility].

## 2013-10-01 NOTE — Assessment & Plan Note (Signed)
Continue 2 liters qhs.  He will check with power company about getting form for emergency power set up in the event of power failure.

## 2013-10-01 NOTE — Assessment & Plan Note (Signed)
Stable on current inhaler regimen.  He will call back if he decides to enroll in pulmonary rehab.

## 2013-10-01 NOTE — Progress Notes (Signed)
Chief Complaint  Patient presents with  . COPD    Breathing is doing well. Reports chest congestion. Denies SOB, chest tighntess.   CC: [name], [facility]  History of Present Illness: Robert [name] is a 53 y.o. male former smoker with GOLD 4COPD, and nocturnal hypoxia.  I last saw Robert [name] in pulmonary office in 2013.  He was in hospital with COPD and respiratory failure in January 2015.  He is doing well.  He tries to exercise at home.  He had sleep study at [facility] and has f/u later this month.  He will d/w them about whether he needs f/u CT chest.  He has occasional cough >> mostly in AM.  He is not having wheeze.  He uses albuterol few times per week.  He uses oxygen 2 liters at night.  Tests: PFT 09/03/10>>FEV1 1.66(42%), FEV1% 37, TLC 7.87(101%), DLCO 92%, no BD  08/07/10 Alpha 1 AT level normal with MM genotype CT chest 12/10/10 >> RUL 5 mm nodule, centrilobular emphysema. CT chest 05/20/11 >> no change. Room air ONO 06/29/11>>Test time 7 hrs 58 min. Baseline SpO2 90.1%, low SpO2 79%, Spent 2 hrs 25 min (30.3%) with SpO2 < 88%. ONO with 2 liters 07/31/11>>Test time 7 hrs 2 min. Mean SpO2 94.6%, low SpO2 84%. Spent 4 min 36 sec with SpO2 < 88%.  He  has a past medical history of COPD with emphysema; Hyperlipidemia; Hypertension; Nephrolithiasis; Hiatal hernia; Seasonal allergies; Pulmonary nodule, right (12/10/2010); Nocturnal hypoxemia due to emphysema; and ACE-inhibitor cough.  He  has past surgical history that includes Knee arthroscopy (2005).   Current Outpatient Prescriptions on File Prior to Visit  Medication Sig Dispense Refill  . albuterol (PROVENTIL HFA;VENTOLIN HFA) 108 (90 BASE) MCG/ACT inhaler Inhale 2 puffs into the lungs every 6 (six) hours as needed for wheezing or shortness of breath.      Marland Kitchen. amLODipine (NORVASC) 10 MG tablet Take 5 mg by mouth daily.      Marland Kitchen. atorvastatin (LIPITOR) 80 MG tablet Take 40 mg by mouth at bedtime.      . budesonide-formoterol  (SYMBICORT) 160-4.5 MCG/ACT inhaler Inhale 2 puffs into the lungs 2 (two) times daily.      . cetirizine (ZYRTEC) 10 MG tablet Take 10 mg by mouth daily.        . Cholecalciferol (VITAMIN D3) 2000 UNITS TABS Take 2,000 Units by mouth daily.      . flunisolide (NASALIDE) 25 MCG/ACT (0.025%) SOLN Place 2 sprays into the nose daily as needed (allergies).      . hydrochlorothiazide (MICROZIDE) 12.5 MG capsule Take 1 capsule (12.5 mg total) by mouth daily.  30 capsule  6  . HYDROcodone-acetaminophen (NORCO/VICODIN) 5-325 MG per tablet Take 1 tablet by mouth 3 (three) times daily as needed for moderate pain.      Marland Kitchen. losartan (COZAAR) 100 MG tablet Take 100 mg by mouth daily.        . Omega-3 Fatty Acids (FISH OIL) 1000 MG CAPS Take 1,000 mg by mouth 2 (two) times daily.      . pantoprazole (PROTONIX) 40 MG tablet Take 40 mg by mouth 2 (two) times daily.      Marland Kitchen. tiotropium (SPIRIVA) 18 MCG inhalation capsule Place 1 capsule (18 mcg total) into inhaler and inhale daily.  30 capsule  6  . urea (CARMOL) 20 % cream Apply 1 application topically daily as needed (rash).       No current facility-administered medications on file prior to  visit.    Allergies  Allergen Reactions  . Ace Inhibitors Cough  . Aspirin     REACTION: intolerance  . Latex Other (See Comments)    unknown    Physical Exam:  General - Obese HEENT - No sinus tenderness, no oral exudate, no LAN Cardiac - s1s2 regular, no murmur Chest - No wheeze/rales/dullness Abdomen - soft, non tender Extremities - No edema Skin - No rashes Neurologic - Normal strength Psychiatric - Normal mood, behavior   Assessment/Plan:  [name], MD [facility] Pulmonary/Critical Care 10/01/2013, 1:52 PM Pager:  [phone] After 3pm call: [phone]

## 2013-12-11 ENCOUNTER — Telehealth: Payer: Self-pay | Admitting: Pulmonary Disease

## 2013-12-11 NOTE — Telephone Encounter (Signed)
Will forward to Dr Sood as FYI  

## 2013-12-11 NOTE — Telephone Encounter (Signed)
Noted  

## 2014-05-24 ENCOUNTER — Encounter: Payer: Self-pay | Admitting: Pulmonary Disease

## 2014-05-24 ENCOUNTER — Ambulatory Visit (INDEPENDENT_AMBULATORY_CARE_PROVIDER_SITE_OTHER): Payer: BC Managed Care – PPO | Admitting: Pulmonary Disease

## 2014-05-24 VITALS — BP 152/84 | HR 81 | Temp 98.0°F | Ht 74.0 in | Wt 260.8 lb

## 2014-05-24 DIAGNOSIS — F32A Depression, unspecified: Secondary | ICD-10-CM

## 2014-05-24 DIAGNOSIS — R0600 Dyspnea, unspecified: Secondary | ICD-10-CM

## 2014-05-24 DIAGNOSIS — G4736 Sleep related hypoventilation in conditions classified elsewhere: Secondary | ICD-10-CM

## 2014-05-24 DIAGNOSIS — Z23 Encounter for immunization: Secondary | ICD-10-CM

## 2014-05-24 DIAGNOSIS — J439 Emphysema, unspecified: Secondary | ICD-10-CM

## 2014-05-24 DIAGNOSIS — J432 Centrilobular emphysema: Secondary | ICD-10-CM

## 2014-05-24 DIAGNOSIS — F329 Major depressive disorder, single episode, unspecified: Secondary | ICD-10-CM

## 2014-05-24 NOTE — Patient Instructions (Signed)
Flu shot today Follow up in 6 months 

## 2014-05-24 NOTE — Progress Notes (Signed)
Chief Complaint  Patient presents with  . Follow-up    6 mo-- no change since last seen. Pt reports good and bad days with SOB   CC: Robert [name], Robert [name] [facility]  History of Present Illness: Robert [name] is a 53 y.o. male former smoker with GOLD 4 COPD, and nocturnal hypoxia.  He has been dealing with depression.  As a result he has not been very active.  He is feeling more difficulty with his breathing.  He tries to work around his house and go for walks >> he finds that he doesn't feel motivated to do activities and worries about getting winded.  He is not having cough, wheeze, or sputum.  He denies chest pain or leg swelling.  Tests: PFT 09/03/10 >>FEV1 1.66(42%), FEV1% 37, TLC 7.87(101%), DLCO 92%, no BD  A1AT 08/07/10 >> normal with MM genotype CT chest 12/10/10 >> RUL 5 mm nodule, centrilobular emphysema. CT chest 05/20/11 >> no change. Room air ONO 06/29/11 >>Test time 7 hrs 58 min. Baseline SpO2 90.1%, low SpO2 79%, Spent 2 hrs 25 min (30.3%) with SpO2 < 88%. ONO with 2 liters 07/31/11 >>Test time 7 hrs 2 min. Mean SpO2 94.6%, low SpO2 84%. Spent 4 min 36 sec with SpO2 < 88%.  PMHx, PSHx, Medications, Allergies, Fhx, Shx reviewed.   Physical Exam:  General - Obese HEENT - No sinus tenderness, no oral exudate, no LAN Cardiac - s1s2 regular, no murmur Chest - No wheeze/rales/dullness Abdomen - soft, non tender Extremities - No edema Skin - No rashes Neurologic - Normal strength Psychiatric - Normal mood, behavior   Assessment/Plan:  Robert HellingVineet Angline Schweigert, MD [facility] Pulmonary/Critical Care 05/24/2014, 12:19 PM Pager:  [phone] After 3pm call: [phone]

## 2014-05-26 ENCOUNTER — Encounter: Payer: Self-pay | Admitting: Pulmonary Disease

## 2014-05-26 DIAGNOSIS — R0600 Dyspnea, unspecified: Secondary | ICD-10-CM | POA: Insufficient documentation

## 2014-05-26 DIAGNOSIS — F329 Major depressive disorder, single episode, unspecified: Secondary | ICD-10-CM | POA: Insufficient documentation

## 2014-05-26 DIAGNOSIS — F32A Depression, unspecified: Secondary | ICD-10-CM | POA: Insufficient documentation

## 2014-05-26 NOTE — Assessment & Plan Note (Signed)
Related to severe COPD and deconditioning.  Encouraged him to maintain his activity level as best he can.

## 2014-05-26 NOTE — Assessment & Plan Note (Signed)
I am concerned that issues with his mood are limiting his motivation to keep up with his activities.  He will f/u with his PCP to further assess therapy for depression.

## 2014-05-26 NOTE — Assessment & Plan Note (Addendum)
Stable on spiriva, symbicort, and prn albuterol.  He gets his medications through the [facility].

## 2014-05-26 NOTE — Assessment & Plan Note (Signed)
Continue 2 liters qhs.

## 2014-07-01 ENCOUNTER — Encounter (HOSPITAL_COMMUNITY): Payer: Self-pay | Admitting: *Deleted

## 2014-07-01 ENCOUNTER — Emergency Department (INDEPENDENT_AMBULATORY_CARE_PROVIDER_SITE_OTHER)
Admission: EM | Admit: 2014-07-01 | Discharge: 2014-07-01 | Disposition: A | Discharge status: Home / Self Care | Payer: BC Managed Care – PPO | Source: Home / Self Care | Attending: Family Medicine | Admitting: Family Medicine

## 2014-07-01 DIAGNOSIS — J441 Chronic obstructive pulmonary disease with (acute) exacerbation: Secondary | ICD-10-CM

## 2014-07-01 MED ORDER — PREDNISONE 50 MG PO TABS: 50.0000 mg | ORAL_TABLET | Freq: Every day | ORAL | Status: DC

## 2014-07-01 MED ORDER — AZITHROMYCIN 250 MG PO TABS: 250.0000 mg | ORAL_TABLET | Freq: Every day | ORAL | Status: DC

## 2014-07-01 MED ORDER — SODIUM CHLORIDE 0.9 % IN NEBU
INHALATION_SOLUTION | RESPIRATORY_TRACT | Status: AC
  Filled 2014-07-01: qty 3

## 2014-07-01 MED ORDER — IPRATROPIUM-ALBUTEROL 0.5-2.5 (3) MG/3ML IN SOLN
3.0000 mL | Freq: Once | RESPIRATORY_TRACT | Status: AC
  Administered 2014-07-01: 3 mL via RESPIRATORY_TRACT

## 2014-07-01 MED ORDER — IPRATROPIUM-ALBUTEROL 0.5-2.5 (3) MG/3ML IN SOLN
RESPIRATORY_TRACT | Status: AC
  Filled 2014-07-01: qty 3

## 2014-07-01 NOTE — Discharge Instructions (Signed)
Thank you for coming in today. Continue albuterol as needed Take prednisone and azithromycin daily Call or go to the emergency room if you get worse, have trouble breathing, have chest pains, or palpitations.   Chronic Obstructive Pulmonary Disease Exacerbation Chronic obstructive pulmonary disease (COPD) is a common lung condition in which airflow from the lungs is limited. COPD is a general term that can be used to describe many different lung problems that limit airflow, including chronic bronchitis and emphysema. COPD exacerbations are episodes when breathing symptoms become much worse and require extra treatment. Without treatment, COPD exacerbations can be life threatening, and frequent COPD exacerbations can cause further damage to your lungs. CAUSES   Respiratory infections.   Exposure to smoke.   Exposure to air pollution, chemical fumes, or dust. Sometimes there is no apparent cause or trigger. RISK FACTORS  Smoking cigarettes.  Older age.  Frequent prior COPD exacerbations. SIGNS AND SYMPTOMS   Increased coughing.   Increased thick spit (sputum) production.   Increased wheezing.   Increased shortness of breath.   Rapid breathing.   Chest tightness. DIAGNOSIS  Your medical history, a physical exam, and tests will help your health care provider make a diagnosis. Tests may include:  A chest X-ray.  Basic lab tests.  Sputum testing.  An arterial blood gas test. TREATMENT  Depending on the severity of your COPD exacerbation, you may need to be admitted to a hospital for treatment. Some of the treatments commonly used to treat COPD exacerbations are:   Antibiotic medicines.   Bronchodilators. These are drugs that expand the air passages. They may be given with an inhaler or nebulizer. Spacer devices may be needed to help improve drug delivery.  Corticosteroid medicines.  Supplemental oxygen therapy.  HOME CARE INSTRUCTIONS   Do not smoke.  Quitting smoking is very important to prevent COPD from getting worse and exacerbations from happening as often.  Avoid exposure to all substances that irritate the airway, especially to tobacco smoke.   If you were prescribed an antibiotic medicine, finish it all even if you start to feel better.  Take all medicines as directed by your health care provider.It is important to use correct technique with inhaled medicines.  Drink enough fluids to keep your urine clear or pale yellow (unless you have a medical condition that requires fluid restriction).  Use a cool mist vaporizer. This makes it easier to clear your chest when you cough.   If you have a home nebulizer and oxygen, continue to use them as directed.   Maintain all necessary vaccinations to prevent infections.   Exercise regularly.   Eat a healthy diet.   Keep all follow-up appointments as directed by your health care provider. SEEK IMMEDIATE MEDICAL CARE IF:  You have worsening shortness of breath.   You have trouble talking.   You have severe chest pain.  You have blood in your sputum.  You have a fever.  You have weakness, vomit repeatedly, or faint.   You feel confused.   You continue to get worse. MAKE SURE YOU:   Understand these instructions.  Will watch your condition.  Will get help right away if you are not doing well or get worse. Document Released: 05/09/2007 Document Revised: 11/26/2013 Document Reviewed: 03/16/2013 [facility] Patient Information 2015 [employer] [place]. This information is not intended to replace advice given to you by your health care provider. Make sure you discuss any questions you have with your health care provider.

## 2014-07-01 NOTE — ED Notes (Addendum)
Started with cough 3 weeks ago.  Sputum was green and now its yellow mixed with green. He was sweating and possible fever Mon or Tues last week.  C/o SOB sometimes.  C/o headache.

## 2014-07-01 NOTE — ED Provider Notes (Signed)
Robert [name] is a 53 y.o. male who presents to Urgent Care today for cough congestion runny nose and wheezing. Symptoms present for about 3 weeks. No chest pains or palpitations. Patient has a history of COPD and emphysema. His oxygen saturation is typically less than normal. He has tried Mucinex which has not helped.   Past Medical History  Diagnosis Date  . COPD with emphysema        . Hyperlipidemia   . Hypertension   . Nephrolithiasis   . Hiatal hernia   . Seasonal allergies   . Pulmonary nodule, right 12/10/2010  . Nocturnal hypoxemia due to emphysema   . ACE-inhibitor cough   . Acute respiratory failure January 2015    2nd to AECOPD, required intubation   Past Surgical History  Procedure Laterality Date  . Knee arthroscopy Right 2005    x 2 -had one in the service.   History  Substance Use Topics  . Smoking status: Former Smoker -- 1.50 packs/day for 16 years    Types: Cigarettes    Quit date: 10/25/1991  . Smokeless tobacco: Never Used  . Alcohol Use: No     Comment: rare   ROS as above Medications: No current facility-administered medications for this encounter.   Current Outpatient Prescriptions  Medication Sig Dispense Refill  . albuterol (PROVENTIL HFA;VENTOLIN HFA) 108 (90 BASE) MCG/ACT inhaler Inhale 2 puffs into the lungs every 6 (six) hours as needed for wheezing or shortness of breath.    Marland Kitchen. amLODipine (NORVASC) 10 MG tablet Take 5 mg by mouth daily.    Marland Kitchen. atorvastatin (LIPITOR) 80 MG tablet Take 40 mg by mouth at bedtime.    . budesonide-formoterol (SYMBICORT) 160-4.5 MCG/ACT inhaler Inhale 2 puffs into the lungs 2 (two) times daily.    . cetirizine (ZYRTEC) 10 MG tablet Take 10 mg by mouth daily.      . Cholecalciferol (VITAMIN D3) 2000 UNITS TABS Take 2,000 Units by mouth daily.    . flunisolide (NASALIDE) 25 MCG/ACT (0.025%) SOLN Place 2 sprays into the nose daily as needed (allergies).    . hydrochlorothiazide (MICROZIDE) 12.5 MG capsule Take 1  capsule (12.5 mg total) by mouth daily. 30 capsule 6  . HYDROcodone-acetaminophen (NORCO/VICODIN) 5-325 MG per tablet Take 1 tablet by mouth 3 (three) times daily as needed for moderate pain.    Marland Kitchen. losartan (COZAAR) 100 MG tablet Take 100 mg by mouth daily.      . Omega-3 Fatty Acids (FISH OIL) 1000 MG CAPS Take 1,000 mg by mouth 2 (two) times daily.    . pantoprazole (PROTONIX) 40 MG tablet Take 40 mg by mouth 2 (two) times daily.    Marland Kitchen. tiotropium (SPIRIVA) 18 MCG inhalation capsule Place 1 capsule (18 mcg total) into inhaler and inhale daily. 30 capsule 6  . urea (CARMOL) 20 % cream Apply 1 application topically daily as needed (rash).    Marland Kitchen. azithromycin (ZITHROMAX) 250 MG tablet Take 1 tablet (250 mg total) by mouth daily. Take first 2 tablets together, then 1 every day until finished. 6 tablet 0  . predniSONE (DELTASONE) 50 MG tablet Take 1 tablet (50 mg total) by mouth daily. 5 tablet 0   Allergies  Allergen Reactions  . Ace Inhibitors Cough  . Aspirin     REACTION: intolerance  . Latex Other (See Comments)    unknown     Exam:  BP 151/96 mmHg  Pulse 90  Temp(Src) 97.7 F (36.5 C) (Oral)  Resp 16  SpO2 93% Gen: Well NAD nontoxic appearing HEENT: EOMI,  MMM . Pharynx with cobblestoning. Normal tympanic membranes bilaterally. Lungs: Normal work of breathing. Poor air movement and wheezing present bilaterally. Heart: RRR no MRG Abd: NABS, Soft. Nondistended, Nontender Exts: Brisk capillary refill, warm and well perfused.   Patient was given a 2.5/0.5 mg DuoNeb nebulizer treatment and felt better. His lung exam improved  No results found for this or any previous visit (from the past 24 hour(s)). No results found.  Assessment and Plan: 53 y.o. male with COPD exacerbation. Treatment with prednisone and azithromycin. Present to the emergency room for worsening.  Discussed warning signs or symptoms. Please see discharge instructions. Patient expresses understanding.     Rodolph BongEvan  S Cheryllynn Sarff, MD 07/01/14 2035

## 2014-12-06 ENCOUNTER — Telehealth: Payer: Self-pay | Admitting: Pulmonary Disease

## 2014-12-06 MED ORDER — AZITHROMYCIN 250 MG PO TABS
ORAL_TABLET | ORAL | Status: AC
Start: 2014-12-06 — End: 2014-12-11

## 2014-12-06 NOTE — Telephone Encounter (Signed)
Spoke with pt. Reports increased SOB, coughing with production of dark green mucus. Onset was 3-4 days ago. Denies chest tightness, wheezing or fever at this time. Would like VS recommendations.  VS - please advise. Thanks.

## 2014-12-06 NOTE — Telephone Encounter (Signed)
Pt returning call.Robert [name] ° °

## 2014-12-06 NOTE — Telephone Encounter (Signed)
Rx has been sent in. Pt is aware. Nothing further was needed. 

## 2014-12-06 NOTE — Telephone Encounter (Signed)
Pt calling back and said disreguard the breathing treatment ,said that he is feeling much better.Caren GriffinsStanley A Dalton

## 2014-12-06 NOTE — Telephone Encounter (Signed)
Can call in script for zpak.

## 2014-12-06 NOTE — Telephone Encounter (Signed)
lmtcb x1 for pt. 

## 2014-12-06 NOTE — Telephone Encounter (Signed)
Spoke with pt.  Confirmed he does not believe he needs a breathing tx now, but he would like further recs on SOB and cough to hold him over the wkend.  He has a pending appt with Dr. Craige CottaSood on Monday, May 16.  Dr. Craige CottaSood, please advise.  Thank you.

## 2014-12-09 ENCOUNTER — Encounter: Payer: Self-pay | Admitting: Pulmonary Disease

## 2014-12-09 ENCOUNTER — Ambulatory Visit (INDEPENDENT_AMBULATORY_CARE_PROVIDER_SITE_OTHER): Payer: BLUE CROSS/BLUE SHIELD | Admitting: Pulmonary Disease

## 2014-12-09 VITALS — BP 98/70 | HR 107 | Ht 73.0 in | Wt 259.0 lb

## 2014-12-09 DIAGNOSIS — J441 Chronic obstructive pulmonary disease with (acute) exacerbation: Secondary | ICD-10-CM

## 2014-12-09 DIAGNOSIS — J432 Centrilobular emphysema: Secondary | ICD-10-CM

## 2014-12-09 DIAGNOSIS — G4736 Sleep related hypoventilation in conditions classified elsewhere: Secondary | ICD-10-CM | POA: Diagnosis not present

## 2014-12-09 DIAGNOSIS — J439 Emphysema, unspecified: Secondary | ICD-10-CM

## 2014-12-09 NOTE — Patient Instructions (Signed)
[facility] spiriva Follow up in 6 months

## 2014-12-09 NOTE — Progress Notes (Signed)
Chief Complaint  Patient presents with  . Follow-up    43mo - Pt c/o increased SOB over the weekend and sharp pain in abd from cough, taking ZPAK since 5/13. Pt reports mucus is still green/yellow in color.  Pt wants to discuss Spiriva.   CC: Hillary BowSamuel [name], [facility]  History of Present Illness: Robert GuntherOrlando [name] is a 54 y.o. male former smoker with GOLD 4 COPD, and nocturnal hypoxia.  He is slowly improving from his recent exacerbation.  He feels Zpak has helped.  He still has cough, but sputum is more clear.  He is not having wheeze.  He denies headache, sinus congestion, fever, chest pain, abdominal symptoms, or swelling.  He was having throat >> better now.  He has not used spiriva for sometime.  He has been using symbicort and albuterol.  He gets med[facility] from the [facility].  Tests: PFT 09/03/10 >>FEV1 1.66(42%), FEV1% 37, TLC 7.87(101%), DLCO 92%, no BD  A1AT 08/07/10 >> normal with MM genotype CT chest 12/10/10 >> RUL 5 mm nodule, centrilobular emphysema. CT chest 05/20/11 >> no change. Room air ONO 06/29/11 >>Test time 7 hrs 58 min. Baseline SpO2 90.1%, low SpO2 79%, Spent 2 hrs 25 min (30.3%) with SpO2 < 88%. ONO with 2 liters 07/31/11 >>Test time 7 hrs 2 min. Mean SpO2 94.6%, low SpO2 84%. Spent 4 min 36 sec with SpO2 < 88%.  PMHx >> HTN, HLD, HH, ACE cough, Depression, Allergies, GERD  PSHx, Medications, Allergies, Fhx, Shx reviewed.   Physical Exam: Blood pressure 98/70, pulse 107, height 6\' 1"  (1.854 m), weight 259 lb (117.482 kg), SpO2 93 %. Body mass index is 34.18 kg/(m^2).  General - Obese HEENT - No sinus tenderness, no oral exudate, no LAN Cardiac - s1s2 regular, no murmur Chest - No wheeze/rales/dullness Abdomen - soft, non tender Extremities - No edema Skin - No rashes Neurologic - Normal strength Psychiatric - Normal mood, behavior   Assessment/Plan:  Recent COPD exacerbation. Slowly improving. Plan: - finish Zpak - defer prednisone, CXR for now  GOLD D  COPD with emphysema. Plan: - [facility] - continue symbicort, albuterol  Nocturnal hypoxia 2nd to COPD/emphysema. Plan: - advised him to resume 2 liters oxygen at night   Coralyn HellingVineet Samanvi Cuccia, MD [facility] Pulmonary/Critical Care 12/09/2014, 11:05 AM Pager:  [phone] After 3pm call: [phone]

## 2015-04-18 ENCOUNTER — Ambulatory Visit (INDEPENDENT_AMBULATORY_CARE_PROVIDER_SITE_OTHER): Payer: BLUE CROSS/BLUE SHIELD | Admitting: Physician Assistant

## 2015-04-18 VITALS — BP 138/82 | HR 81 | Temp 98.2°F | Resp 16 | Ht 73.5 in | Wt 254.0 lb

## 2015-04-18 DIAGNOSIS — L089 Local infection of the skin and subcutaneous tissue, unspecified: Secondary | ICD-10-CM

## 2015-04-18 DIAGNOSIS — L0889 Other specified local infections of the skin and subcutaneous tissue: Secondary | ICD-10-CM | POA: Diagnosis not present

## 2015-04-18 NOTE — Patient Instructions (Signed)
You can clean ear with soap and water or hydrogen peroxide. Do not stick any objects in your ear. You can place warm compresses over your ear and can help with drainage. Ibuprofen/tylenol for pain. Return if symptoms not improving in 3-4 days or at any time your symptoms worsen or if you develop fever or chills.

## 2015-04-18 NOTE — Progress Notes (Signed)
[facility] [address] [place] [address] [phone]  Date:  04/18/2015   Name:  Robert [name]   DOB:  [birth year]   MRN:  [id number]  PCP:  PROVIDER NOT IN SYSTEM    Chief Complaint: Ear Pain   History of Present Illness:  This is a 54 y.o. male with PMH HTN, COPD who is presenting with right ear pain x 2 days. States it hurts to press outside of ear. Pain exacerbated with palpation and chewing food. He denies URI sx. He denies ear drainage, hearing loss, fever or chills. He has tried cleaning with a wash cloth and q-tip without help.  Review of Systems:  Review of Systems See HPI  Patient Active Problem List   Diagnosis Date Noted  . Dyspnea 05/26/2014  . Depression 05/26/2014  . Acid indigestion 08/04/2013  . Nocturnal hypoxemia due to emphysema 07/15/2011  . ALLERGIC RHINITIS 08/07/2010  . COPD with emphysema 08/07/2010    Prior to Admission medications   Medication Sig Start Date End Date Taking? Authorizing Provider  albuterol (PROVENTIL HFA;VENTOLIN HFA) 108 (90 BASE) MCG/ACT inhaler Inhale 2 puffs into the lungs every 6 (six) hours as needed for wheezing or shortness of breath.   Yes Historical Provider, MD  amLODipine (NORVASC) 10 MG tablet Take 5 mg by mouth daily.   Yes Historical Provider, MD  atorvastatin (LIPITOR) 80 MG tablet Take 40 mg by mouth at bedtime.   Yes Historical Provider, MD  budesonide-formoterol (SYMBICORT) 160-4.5 MCG/ACT inhaler Inhale 2 puffs into the lungs 2 (two) times daily.   Yes Historical Provider, MD  cetirizine (ZYRTEC) 10 MG tablet Take 10 mg by mouth daily.     Yes Historical Provider, MD  Cholecalciferol (VITAMIN D3) 2000 UNITS TABS Take 2,000 Units by mouth daily.   Yes Historical Provider, MD  flunisolide (NASALIDE) 25 MCG/ACT (0.025%) SOLN Place 2 sprays into the nose daily as needed (allergies).   Yes Historical Provider, MD  hydrochlorothiazide (MICROZIDE) 12.5 MG capsule Take 1 capsule (12.5 mg  total) by mouth daily. 08/07/13  Yes Simonne Martinet, NP  HYDROcodone-acetaminophen (NORCO/VICODIN) 5-325 MG per tablet Take 1 tablet by mouth 3 (three) times daily as needed for moderate pain.   Yes Historical Provider, MD  losartan (COZAAR) 100 MG tablet Take 100 mg by mouth daily.     Yes Historical Provider, MD  Omega-3 Fatty Acids (FISH OIL) 1000 MG CAPS Take 1,000 mg by mouth 2 (two) times daily.   Yes Historical Provider, MD  pantoprazole (PROTONIX) 40 MG tablet Take 40 mg by mouth 2 (two) times daily.   Yes Historical Provider, MD  tiotropium (SPIRIVA) 18 MCG inhalation capsule Place 1 capsule (18 mcg total) into inhaler and inhale daily. 11/16/10  Yes [name], MD  urea (CARMOL) 20 % cream Apply 1 application topically daily as needed (rash).   Yes Historical Provider, MD    Allergies  Allergen Reactions  . Ace Inhibitors Cough  . Aspirin     REACTION: intolerance  . Latex Other (See Comments)    unknown    Past Surgical History  Procedure Laterality Date  . Knee arthroscopy Right 2005    x 2 -had one in the service.    Social History  Substance Use Topics  . Smoking status: Former Smoker -- 1.50 packs/day for 16 years    Types: Cigarettes    Quit date: 10/25/1991  . Smokeless tobacco: Never Used  . Alcohol Use: No  Comment: rare    Family History  Problem Relation Age of Onset  . Pancreatic cancer Father   . Heart disease Sister     Medication list has been reviewed and updated.  Physical Examination:  Physical Exam  Constitutional: He is oriented to person, place, and time. He appears well-developed and well-nourished. No distress.  HENT:  Head: Normocephalic and atraumatic.  Right Ear: Hearing, tympanic membrane and external ear normal.  Left Ear: Hearing, tympanic membrane, external ear and ear canal normal.  Nose: Nose normal.  Mouth/Throat: Uvula is midline, oropharynx is clear and moist and mucous membranes are normal.  Pain with palpation of  tragus. Canal with small pustule formation behind tragus. Otherwise canal normal - no erythema or swelling. Skin was cleaned with alcohol and an 18 gauge needle was used to puncture pustule, small amount pus and small expressed. Tenderness lessened after punctured.  Eyes: Conjunctivae and lids are normal. Right eye exhibits no discharge. Left eye exhibits no discharge. No scleral icterus.  Pulmonary/Chest: Effort normal. No respiratory distress.  Musculoskeletal: Normal range of motion.  Lymphadenopathy:       Head (right side): No submental, no submandibular and no tonsillar adenopathy present.       Head (left side): No submental, no submandibular and no tonsillar adenopathy present.    He has no cervical adenopathy.  Neurological: He is alert and oriented to person, place, and time.  Skin: Skin is warm, dry and intact. No lesion and no rash noted.  Psychiatric: He has a normal mood and affect. His speech is normal and behavior is normal. Thought content normal.   BP 138/82 mmHg  Pulse 81  Temp(Src) 98.2 F (36.8 C) (Oral)  Resp 16  Ht 6' 1.5" (1.867 m)  Wt 254 lb (115.214 kg)  BMI 33.05 kg/m2  SpO2 94%  Assessment and Plan:  1. Skin pustule Pustule in right ear punctured with 18 gauge needle. Pain lessened afterwards. Advised cleaning with soap and water or hydrogen peroxide. Counseled on warm compresses. Return if symptoms have not resolved in 5-7 days or at any time if sx worsen.   [place]well Min[place] Dyke Brackett, MHS [facility]  04/18/2015

## 2015-06-09 ENCOUNTER — Ambulatory Visit (INDEPENDENT_AMBULATORY_CARE_PROVIDER_SITE_OTHER)
Admission: RE | Admit: 2015-06-09 | Discharge: 2015-06-09 | Disposition: A | Discharge status: Home / Self Care | Payer: BLUE CROSS/BLUE SHIELD | Source: Ambulatory Visit | Attending: Adult Health | Admitting: Adult Health

## 2015-06-09 ENCOUNTER — Ambulatory Visit (INDEPENDENT_AMBULATORY_CARE_PROVIDER_SITE_OTHER): Payer: BLUE CROSS/BLUE SHIELD | Admitting: Adult Health

## 2015-06-09 ENCOUNTER — Encounter: Payer: Self-pay | Admitting: Adult Health

## 2015-06-09 VITALS — BP 118/82 | HR 89 | Temp 97.5°F | Ht 74.0 in | Wt 260.0 lb

## 2015-06-09 DIAGNOSIS — J439 Emphysema, unspecified: Secondary | ICD-10-CM

## 2015-06-09 NOTE — Assessment & Plan Note (Signed)
Stable without flare   Plan  Continue on current regimen .  Chest xray today  Follow up with Dr. Craige CottaSood in 6 months and As needed

## 2015-06-09 NOTE — Progress Notes (Signed)
Reviewed and agree with assessment/plan. 

## 2015-06-09 NOTE — Progress Notes (Signed)
Quick Note:  Called and spoke with pt. Reviewed results and recs. Pt voiced understanding and had no further questions. ______ 

## 2015-06-09 NOTE — Patient Instructions (Signed)
Continue on current regimen .  Chest xray today  Follow up with Dr. Craige CottaSood in 6 months and As needed

## 2015-06-09 NOTE — Addendum Note (Signed)
Addended by: Karalee HeightOX, Elisabetta Mishra P on: 06/09/2015 11:09 AM   Modules accepted: Orders

## 2015-06-09 NOTE — Progress Notes (Signed)
Subjective:    Patient ID: Robert [name], male    DOB: May 07, 1961, 54 y.o.   MRN: [id number]  HPI  54 yo male former smoker with GOLD 4 COPD, and nocturnal hypoxia.  06/09/2015 Follow up : COPD GOLD IV  Patient returns for a six-month follow-up. Patient is followed for COPD. He remains on Symbicort and Spiriva. Says that he gets short of breath with walking long distance. Has  an occasional cough with white mucus. He denies any hemoptysis, chest pain, orthopnea, PND, or increased leg swelling. Declines flu shot today Last chest x-ray was January 2015.   Tests: PFT 09/03/10 >>FEV1 1.66(42%), FEV1% 37, TLC 7.87(101%), DLCO 92%, no BD  A1AT 08/07/10 >> normal with MM genotype CT chest 12/10/10 >> RUL 5 mm nodule, centrilobular emphysema. CT chest 05/20/11 >> no change. Room air ONO 06/29/11 >>Test time 7 hrs 58 min. Baseline SpO2 90.1%, low SpO2 79%, Spent 2 hrs 25 min (30.3%) with SpO2 < 88%. ONO with 2 liters 07/31/11 >>Test time 7 hrs 2 min. Mean SpO2 94.6%, low SpO2 84%. Spent 4 min 36 sec with SpO2 < 88%.  Past Medical History  Diagnosis Date  . COPD with emphysema (HCC)        . Hyperlipidemia   . Hypertension   . Nephrolithiasis   . Hiatal hernia   . Seasonal allergies   . Pulmonary nodule, right 12/10/2010  . Nocturnal hypoxemia due to emphysema (HCC)   . ACE-inhibitor cough   . Acute respiratory failure [facility]) January 2015    2nd to AECOPD, required intubation   Current Outpatient Prescriptions on File Prior to Visit  Medication Sig Dispense Refill  . albuterol (PROVENTIL HFA;VENTOLIN HFA) 108 (90 BASE) MCG/ACT inhaler Inhale 2 puffs into the lungs every 6 (six) hours as needed for wheezing or shortness of breath.    Marland Kitchen amLODipine (NORVASC) 10 MG tablet Take 5 mg by mouth daily.    Marland Kitchen atorvastatin (LIPITOR) 80 MG tablet Take 40 mg by mouth at bedtime.    . cetirizine (ZYRTEC) 10 MG tablet Take 10 mg by mouth daily.      . Cholecalciferol (VITAMIN D3) 2000 UNITS TABS  Take 2,000 Units by mouth daily.    . flunisolide (NASALIDE) 25 MCG/ACT (0.025%) SOLN Place 2 sprays into the nose daily as needed (allergies).    Marland Kitchen HYDROcodone-acetaminophen (NORCO/VICODIN) 5-325 MG per tablet Take 1 tablet by mouth 3 (three) times daily as needed for moderate pain.    Marland Kitchen losartan (COZAAR) 100 MG tablet Take 100 mg by mouth daily.      . Omega-3 Fatty Acids (FISH OIL) 1000 MG CAPS Take 1,000 mg by mouth 2 (two) times daily.    . pantoprazole (PROTONIX) 40 MG tablet Take 40 mg by mouth 2 (two) times daily.    Marland Kitchen tiotropium (SPIRIVA) 18 MCG inhalation capsule Place 1 capsule (18 mcg total) into inhaler and inhale daily. 30 capsule 6  . urea (CARMOL) 20 % cream Apply 1 application topically daily as needed (rash).    . budesonide-formoterol (SYMBICORT) 160-4.5 MCG/ACT inhaler Inhale 2 puffs into the lungs 2 (two) times daily.     No current facility-administered medications on file prior to visit.     Review of Systems  Constitutional:   No  weight loss, night sweats,  Fevers, chills, +fatigue, or  lassitude.  HEENT:   No headaches,  Difficulty swallowing,  Tooth/dental problems, or  Sore throat,  No sneezing, itching, ear ache, nasal congestion, post nasal drip,   CV:  No chest pain,  Orthopnea, PND, swelling in lower extremities, anasarca, dizziness, palpitations, syncope.   GI  No heartburn, indigestion, abdominal pain, nausea, vomiting, diarrhea, change in bowel habits, loss of appetite, bloody stools.   Resp:    No non-productive cough,  No coughing up of blood.  No change in color of mucus.  No wheezing.  No chest wall deformity  Skin: no rash or lesions.  GU: no dysuria, change in color of urine, no urgency or frequency.  No flank pain, no hematuria   MS:  No joint pain or swelling.  No decreased range of motion.  No back pain.  Psych:  No change in mood or affect. No depression or anxiety.  No memory loss.          Objective:   Physical  Exam GEN: A/Ox3; pleasant , NAD, obese   HEENT:  [place]/AT,  EACs-clear, TMs-wnl, NOSE-clear, THROAT-clear, no lesions, no postnasal drip or exudate noted.   NECK:  Supple w/ fair ROM; no JVD; normal carotid impulses w/o bruits; no thyromegaly or nodules palpated; no lymphadenopathy.  RESP  Decreased BS in bases , no accessory muscle use, no dullness to percussion  CARD:  RRR, no m/r/g  , no peripheral edema, pulses intact, no cyanosis or clubbing.  GI:   Soft & nt; nml bowel sounds; no organomegaly or masses detected.  Musco: Warm bil, no deformities or joint swelling noted.   Neuro: alert, no focal deficits noted.    Skin: Warm, no lesions or rashes         Assessment & Plan:

## 2015-12-16 ENCOUNTER — Ambulatory Visit (INDEPENDENT_AMBULATORY_CARE_PROVIDER_SITE_OTHER): Payer: BLUE CROSS/BLUE SHIELD | Admitting: Pulmonary Disease

## 2015-12-16 ENCOUNTER — Encounter: Payer: Self-pay | Admitting: Pulmonary Disease

## 2015-12-16 VITALS — BP 142/86 | HR 81 | Ht 73.0 in | Wt 255.8 lb

## 2015-12-16 DIAGNOSIS — J432 Centrilobular emphysema: Secondary | ICD-10-CM | POA: Diagnosis not present

## 2015-12-16 DIAGNOSIS — J439 Emphysema, unspecified: Secondary | ICD-10-CM

## 2015-12-16 DIAGNOSIS — J9611 Chronic respiratory failure with hypoxia: Secondary | ICD-10-CM | POA: Diagnosis not present

## 2015-12-16 NOTE — Assessment & Plan Note (Signed)
Stable 6 month interval Plan: Continue your Symbicort 2 puffs twice daily. Remember to rinse your mouth after use. Continue your Singulair once daily. Continue your Proventil inhaler every 4-6 hours as needed for shortness of breath or wheezing. We will consider the pneumonia booster at your next 6 month. Remember to get the flu vaccine this fall. Follow up with Dr. Craige CottaSood in 6 months. Please contact office for sooner follow up if symptoms do not improve or worsen or seek emergency care

## 2015-12-16 NOTE — Progress Notes (Signed)
History of Present Illness Robert [name] is a 55 y.o. male former smoker with Gold 4 COPD and nocturnal hypoxemia.   12/16/2015  6 Month Follow Up : Pt.presents to the office for 6 month follow up. He is doing well.He is compliant with his Symbicort 2 puffs twice daily. He uses his rescue inhaler for exertional dyspnea, and his Spiriva once daily. He has not had any exacerbations, and feels this has been a stable 6 months for him. He is not wearing his nocturnal oxygen.   Past medical hx Past Medical History  Diagnosis Date  . COPD with emphysema (HCC)        . Hyperlipidemia   . Hypertension   . Nephrolithiasis   . Hiatal hernia   . Seasonal allergies   . Pulmonary nodule, right 12/10/2010  . Nocturnal hypoxemia due to emphysema (HCC)   . ACE-inhibitor cough   . Acute respiratory failure [facility](HCC) January 2015    2nd to AECOPD, required intubation     Past surgical hx, Family hx, Social hx all reviewed.  Current Outpatient Prescriptions on File Prior to Visit  Medication Sig  . albuterol (PROVENTIL HFA;VENTOLIN HFA) 108 (90 BASE) MCG/ACT inhaler Inhale 2 puffs into the lungs every 6 (six) hours as needed for wheezing or shortness of breath.  Marland Kitchen. amLODipine (NORVASC) 10 MG tablet Take 5 mg by mouth daily.  Marland Kitchen. atorvastatin (LIPITOR) 80 MG tablet Take 40 mg by mouth at bedtime.  . cetirizine (ZYRTEC) 10 MG tablet Take 10 mg by mouth daily.    . Cholecalciferol (VITAMIN D3) 2000 UNITS TABS Take 2,000 Units by mouth daily.  . flunisolide (NASALIDE) 25 MCG/ACT (0.025%) SOLN Place 2 sprays into the nose daily as needed (allergies).  . hydrochlorothiazide (HYDRODIURIL) 25 MG tablet Take 1 tablet by mouth daily.  Marland Kitchen. HYDROcodone-acetaminophen (NORCO/VICODIN) 5-325 MG per tablet Take 1 tablet by mouth 3 (three) times daily as needed for moderate pain.  Marland Kitchen. losartan (COZAAR) 100 MG tablet Take 100 mg by mouth daily.    . Omega-3 Fatty Acids (FISH OIL) 1000 MG CAPS Take 1,000 mg by mouth 2  (two) times daily.  . pantoprazole (PROTONIX) 40 MG tablet Take 40 mg by mouth 2 (two) times daily.  Marland Kitchen. tiotropium (SPIRIVA) 18 MCG inhalation capsule Place 1 capsule (18 mcg total) into inhaler and inhale daily.  . urea (CARMOL) 20 % cream Apply 1 application topically daily as needed (rash).  . budesonide-formoterol (SYMBICORT) 160-4.5 MCG/ACT inhaler Inhale 2 puffs into the lungs 2 (two) times daily.  . SYMBICORT 80-4.5 MCG/ACT inhaler Inhale 2 puffs into the lungs 2 (two) times daily.   No current facility-administered medications on file prior to visit.     Allergies  Allergen Reactions  . Ace Inhibitors Cough  . Aspirin     REACTION: intolerance  . Latex Other (See Comments)    unknown    Review Of Systems:  Constitutional:   No  weight loss, night sweats,  Fevers, chills, fatigue, or  lassitude.  HEENT:   No headaches,  Difficulty swallowing,  Tooth/dental problems, or  Sore throat,                No sneezing, itching, ear ache, nasal congestion, post nasal drip,   CV:  No chest pain,  Orthopnea, PND, swelling in lower extremities, anasarca, dizziness, palpitations, syncope.   GI  No heartburn, indigestion, abdominal pain, nausea, vomiting, diarrhea, change in bowel habits, loss of appetite, bloody stools.   Resp: +  shortness of breath with exertion not at rest.  No excess mucus, no productive cough,  No non-productive cough,  No coughing up of blood.  No change in color of mucus.  No wheezing.  No chest wall deformity  Skin: no rash or lesions.  GU: no dysuria, change in color of urine, no urgency or frequency.  No flank pain, no hematuria   MS:  No joint pain or swelling.  No decreased range of motion.  No back pain.  Psych:  No change in mood or affect. No depression or anxiety.  No memory loss.   Vital Signs BP 142/86 mmHg  Pulse 81  Ht  (1.854 m)  Wt 255 lb 12.8 oz (116.03 kg)  BMI 33.76 kg/m2  SpO2 92%   Physical Exam:  General- No distress,   A&Ox3, pleasant ENT: No sinus tenderness, TM clear, pale nasal mucosa, no oral exudate,no post nasal drip, no LAN Cardiac: S1, S2, regular rate and rhythm, no murmur Chest: No wheeze/ rales/ dullness; no accessory muscle use, no nasal flaring, no sternal retractions Abd.: Soft Non-tender Ext: No clubbing cyanosis, edema Neuro:  normal strength Skin: No rashes, warm and dry Psych: normal mood and behavior   Assessment/Plan  COPD with emphysema Stable 6 month interval Plan: Continue your Symbicort 2 puffs twice daily. Remember to rinse your mouth after use. Continue your Singulair once daily. Continue your Proventil inhaler every 4-6 hours as needed for shortness of breath or wheezing. We will consider the pneumonia booster at your next 6 month. Remember to get the flu vaccine this fall. Follow up with Dr. Craige Cotta in 6 months. Please contact office for sooner follow up if symptoms do not improve or worsen or seek emergency care       Bevelyn Ngo, NP 12/16/2015  5:12 PM  Reviewed above.  COPD with emphysema, chronic respiratory failure. - continue symbicort - continue singulair for now >> re-assess at next visit - prn albuterol - discuss prevnar at next visit - not using oxygen at night >> will arrange for ono on room air.  [name], MD [facility] Pulmonary/Critical Care 12/16/2015, 5:32 PM Pager:  [phone] After 3pm call: [phone]

## 2015-12-16 NOTE — Patient Instructions (Addendum)
It is nice to meet you today. Continue your Symbicort 2 puffs twice daily. Remember to rinse your mouth after use. Continue your Singulair once daily. Continue your Proventil inhaler every 4-6 hours as needed for shortness of breath or wheezing. We will consider the pneumonia booster at your next 6 month. Remember to get the flu vaccine this fall. Follow up with Dr. Craige CottaSood in 6 months. Please contact office for sooner follow up if symptoms do not improve or worsen or seek emergency care

## 2015-12-17 ENCOUNTER — Telehealth: Payer: Self-pay | Admitting: Pulmonary Disease

## 2015-12-17 NOTE — Telephone Encounter (Signed)
Spoke with Larita FifeLynn at [facility], needs order for ONO either signed or stamped with VS signature.  Please advise PCC. Thanks.

## 2015-12-17 NOTE — Telephone Encounter (Signed)
Last ov with 12/15/25 with VS  Patient Instructions       It is nice to meet you today. Continue your Symbicort 2 puffs twice daily. Remember to rinse your mouth after use. Continue your Singulair once daily. Continue your Proventil inhaler every 4-6 hours as needed for shortness of breath or wheezing. We will consider the pneumonia booster at your next 6 month. Remember to get the flu vaccine this fall. Follow up with Dr. Craige CottaSood in 6 months. Please contact office for sooner follow up if symptoms do not improve or worsen or seek emergency care    Pt states he is calling for clarification on his medication. He states he is taking symbicort 80 Take 2 puffs BID and glipizide 50mg  Take 1 tablet by mouth BID before meals. I explained to him that I would make the changes on his medication list. He voiced understanding and had no further questions. Changes made. Nothing further needed.

## 2015-12-17 NOTE — Telephone Encounter (Signed)
Called [facility] and spoke with RailroadStephanie. She states the office has stepped out for lunch and that she will take a message for a call back.

## 2015-12-17 NOTE — Telephone Encounter (Signed)
Larita FifeLynn from [facility] cb [phone]

## 2015-12-17 NOTE — Telephone Encounter (Signed)
I refaxed this order again and also gave the order to Selena BattenKim and Minna AntisLorna which happened to be in the office at this time

## 2015-12-17 NOTE — Telephone Encounter (Signed)
Order refaxed to [facility] Selena BattenKim with [facility] was in our office around lunch today and the order was give to her to take back to [facility] with her.  LM with Larita FifeLynn to make aware

## 2016-01-02 ENCOUNTER — Telehealth: Payer: Self-pay | Admitting: Pulmonary Disease

## 2016-01-02 NOTE — Telephone Encounter (Signed)
ONO with RA 12/31/15 >> test time 6 hrs 59 min.  Baseline SpO2 83.2%, low SpO2 72%.  Spent 6 hrs 23 min with SpO2 < 88%.   Will have my nurse inform pt that his oxygen level remains too low while asleep.  He needs to resume using 2 liters oxygen while asleep.

## 2016-01-07 NOTE — Telephone Encounter (Signed)
Results have been explained to patient, pt expressed understanding. Nothing further needed.  

## 2016-01-19 ENCOUNTER — Encounter: Payer: Self-pay | Admitting: Pulmonary Disease

## 2016-05-11 IMAGING — DX DG CHEST 2V
2 series · 2 of 2 positions shown · non-contrast
Comparison: PA and lateral chest x-ray August 02, 2013

CLINICAL DATA: COPD-emphysema, diabetes, hypertension, previous
smoker, routine exam.

EXAM:
CHEST  2 VIEW

[chest pa]
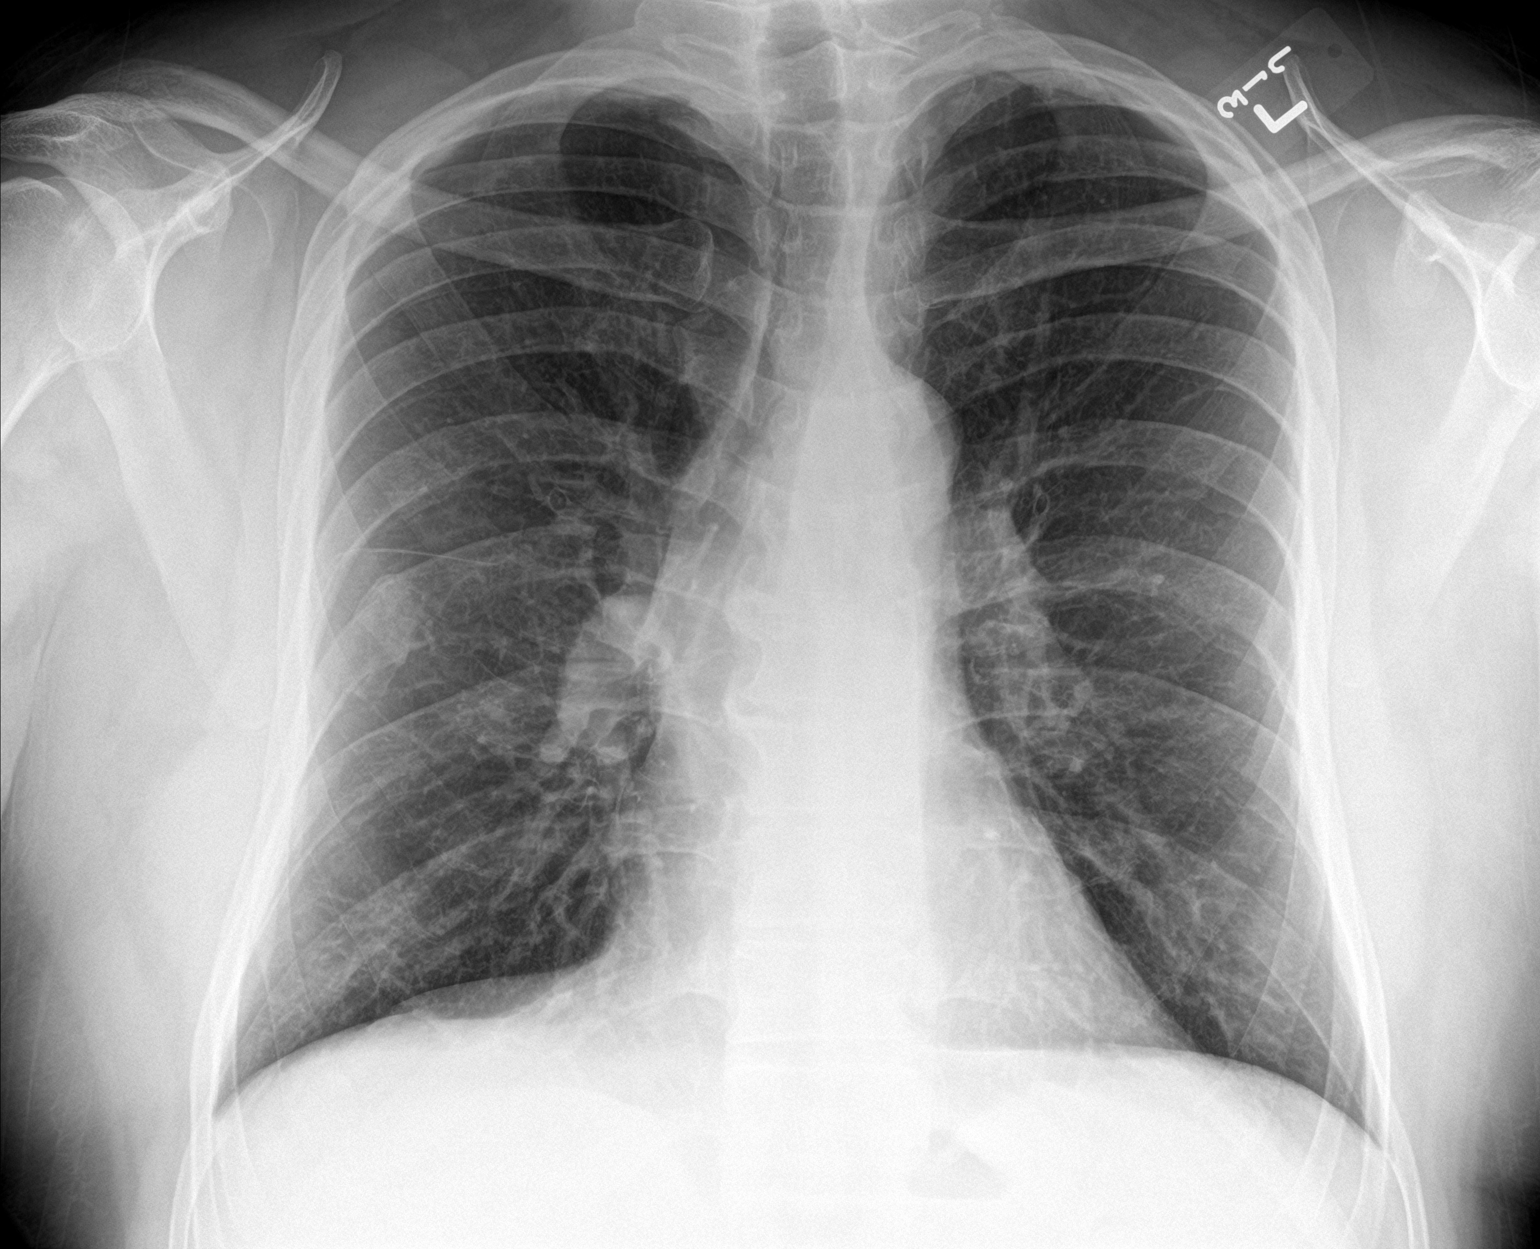

[chest lat]
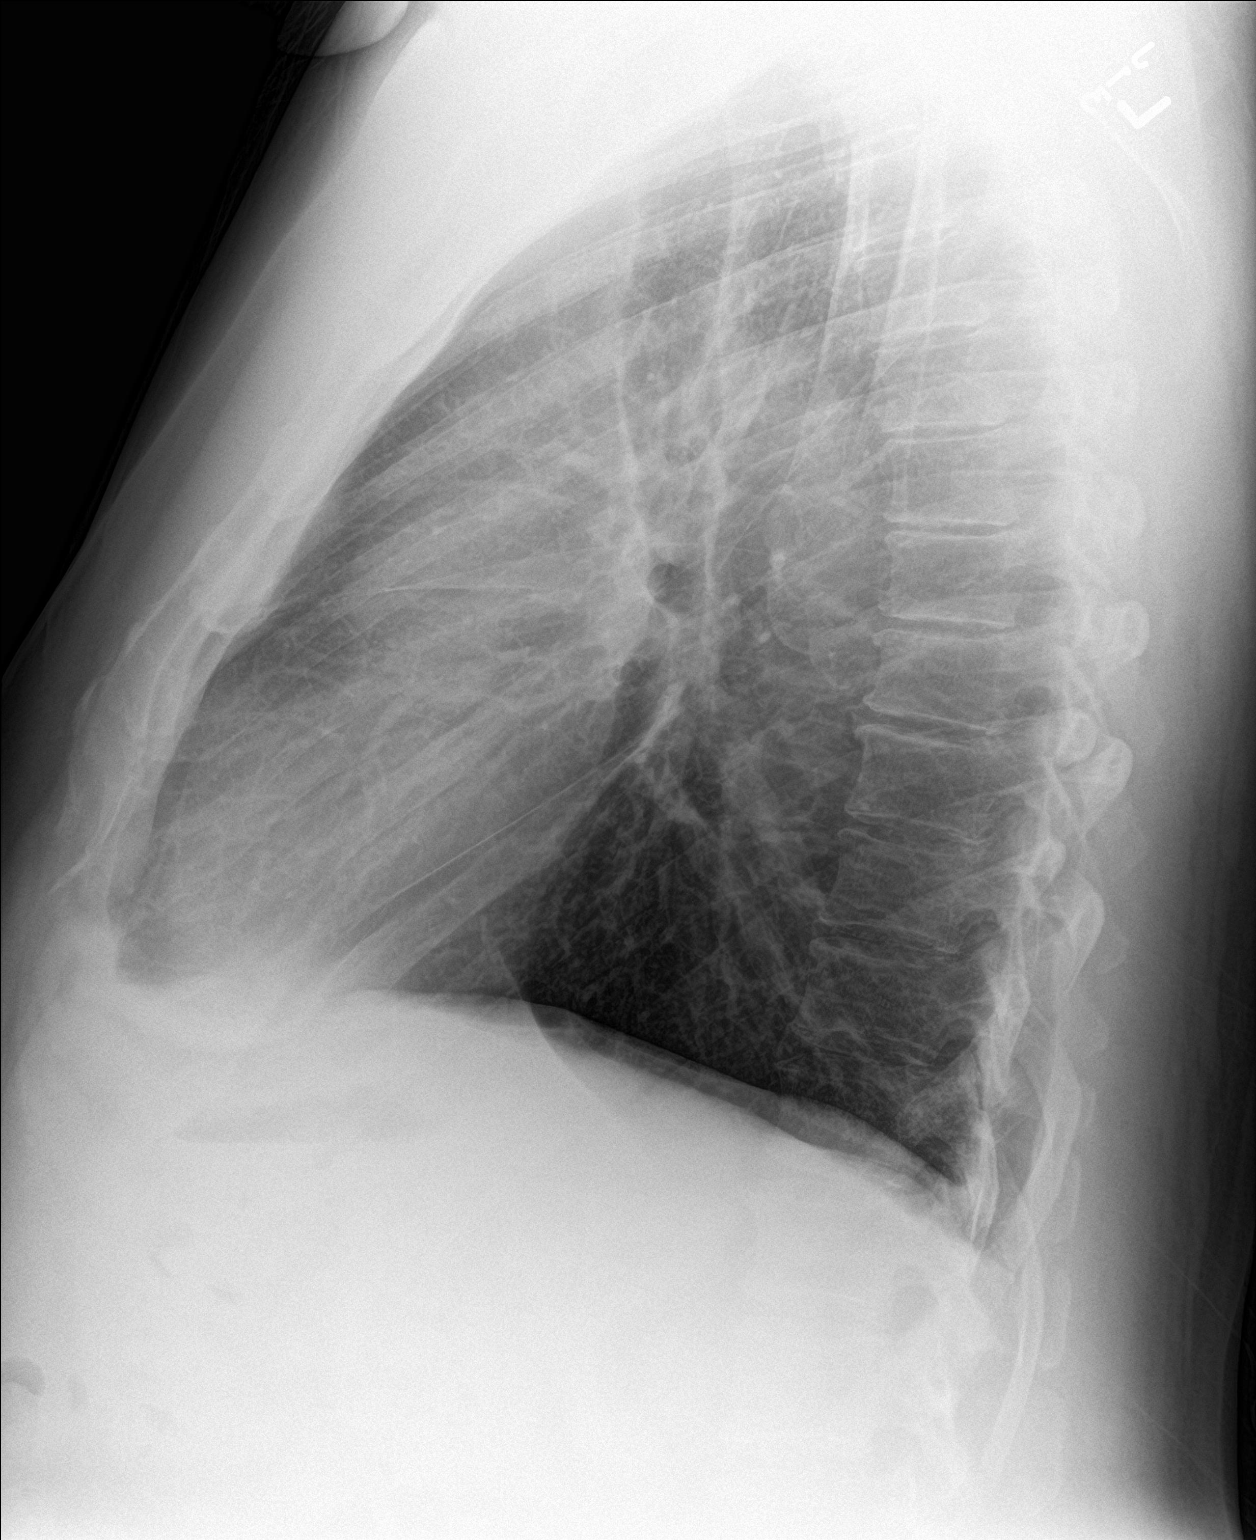

[2 of 2 positions shown; findings below may reference images not displayed]

FINDINGS: The lungs remain hyperinflated with hemidiaphragm flattening. There
is no focal infiltrate. There is no pleural effusion. The heart and
pulmonary vascularity are normal. There is old deformity of the
posterior lateral aspect of the right eighth rib. There is subtle
deformity of the lateral aspect of the left eighth rib which is also
chronic. There is mild multilevel degenerative disc disease of
thoracic spine.
IMPRESSION: COPD. There is no pneumonia nor other acute cardiopulmonary
abnormality. No abnormal parenchymal masses or not are observed.

## 2016-06-01 ENCOUNTER — Ambulatory Visit (INDEPENDENT_AMBULATORY_CARE_PROVIDER_SITE_OTHER): Payer: BLUE CROSS/BLUE SHIELD | Admitting: Pulmonary Disease

## 2016-06-01 ENCOUNTER — Encounter: Payer: Self-pay | Admitting: Pulmonary Disease

## 2016-06-01 VITALS — BP 132/80 | HR 68 | Ht 73.0 in | Wt 241.0 lb

## 2016-06-01 DIAGNOSIS — J441 Chronic obstructive pulmonary disease with (acute) exacerbation: Secondary | ICD-10-CM

## 2016-06-01 DIAGNOSIS — J432 Centrilobular emphysema: Secondary | ICD-10-CM

## 2016-06-01 MED ORDER — PREDNISONE 10 MG PO TABS: ORAL_TABLET | ORAL | 0 refills | Status: DC

## 2016-06-01 NOTE — Patient Instructions (Signed)
Prednisone 10 mg pill >> 3 pills daily for 2 days, 2 pills daily for 2 days, 1 pill daily for 2 days  Try mucinex to help break up phlegm  Call if not feeling better after finishing prednisone  Follow up in 6 months

## 2016-06-01 NOTE — Progress Notes (Signed)
Current Outpatient Prescriptions on File Prior to Visit  Medication Sig  . amLODipine (NORVASC) 10 MG tablet Take 5 mg by mouth daily.  Marland Kitchen. atorvastatin (LIPITOR) 80 MG tablet Take 40 mg by mouth at bedtime.  . budesonide-formoterol (SYMBICORT) 160-4.5 MCG/ACT inhaler Inhale 2 puffs into the lungs 2 (two) times daily.  . cetirizine (ZYRTEC) 10 MG tablet Take 10 mg by mouth daily.    . Cholecalciferol (VITAMIN D3) 2000 UNITS TABS Take 2,000 Units by mouth daily.  . flunisolide (NASALIDE) 25 MCG/ACT (0.025%) SOLN Place 2 sprays into the nose daily as needed (allergies).  Marland Kitchen. glipiZIDE (GLUCOTROL) 5 MG tablet Take 1 tablet by mouth 2 (two) times daily before a meal.  . hydrochlorothiazide (HYDRODIURIL) 25 MG tablet Take 1 tablet by mouth daily.  Marland Kitchen. HYDROcodone-acetaminophen (NORCO/VICODIN) 5-325 MG per tablet Take 1 tablet by mouth 3 (three) times daily as needed for moderate pain.  Marland Kitchen. losartan (COZAAR) 100 MG tablet Take 100 mg by mouth daily.    . Omega-3 Fatty Acids (FISH OIL) 1000 MG CAPS Take 1,000 mg by mouth 2 (two) times daily.  . pantoprazole (PROTONIX) 40 MG tablet Take 40 mg by mouth 2 (two) times daily.  . SYMBICORT 80-4.5 MCG/ACT inhaler Inhale 2 puffs into the lungs 2 (two) times daily.  Marland Kitchen. tiotropium (SPIRIVA) 18 MCG inhalation capsule Place 1 capsule (18 mcg total) into inhaler and inhale daily.  . urea (CARMOL) 20 % cream Apply 1 application topically daily as needed (rash).   No current facility-administered medications on file prior to visit.     Chief Complaint  Patient presents with  . Follow-up    Pt reports little trouble with SOB. Using albuterol more frequently than normal with the change if weather. Discuss Prevnar and Flu vaccine.     CC: Robert BowSamuel [name], [facility]  Pulmonary tests PFT 09/03/10 >>FEV1 1.66(42%), FEV1% 37, TLC 7.87(101%), DLCO 92%, no BD  A1AT 08/07/10 >> normal with MM genotype CT chest 12/10/10 >> RUL 5 mm nodule, centrilobular emphysema. CT  chest 05/20/11 >> no change. Room air ONO 06/29/11 >>Test time 7 hrs 58 min. Baseline SpO2 90.1%, low SpO2 79%, Spent 2 hrs 25 min (30.3%) with SpO2 < 88%. ONO with 2 liters 07/31/11 >>Test time 7 hrs 2 min. Mean SpO2 94.6%, low SpO2 84%. Spent 4 min 36 sec with SpO2 < 88%.  Past medical history HTN, HLD, HH, ACE cough, Depression, Allergies, GERD  Past surgical history, Family history, Social history, Allergies reviewed  Vital signs BP 132/80 (BP Location: Right Arm, Cuff Size: Normal)   Pulse 68   Ht 6\' 1"  (1.854 m)   Wt 241 lb (109.3 kg)   SpO2 92%   BMI 31.80 kg/m    History of Present Illness: Robert [name] is a 55 y.o. male former smoker with GOLD 4 COPD, and nocturnal hypoxia.  He has been getting more cough and chest congestion.  He has been getting some wheeze.  This started from allergies and sinus congestion.  He has been using albuterol several times per day.  He denies fever, chest pain, or hemoptysis.     Physical Exam:  General - Obese HEENT - No sinus tenderness, no oral exudate, no LAN Cardiac - s1s2 regular, no murmur Chest - No wheeze/rales/dullness Abdomen - soft, non tender Extremities - No edema Skin - No rashes Neurologic - Normal strength Psychiatric - Normal mood, behavior   Assessment/Plan:  COPD exacerbation. - likely related to allergies and weather change -  prednisone taper - mucinex prn - defer Abx, CXR for now  GOLD D COPD with emphysema. - continue spiriva, symbicort, albuterol - [facility] - advised him that he should get flu shot and prevnar once he is feeling better  Nocturnal hypoxia 2nd to COPD/emphysema. - advised him to resume 2 liters oxygen at night   Patient Instructions  Prednisone 10 mg pill >> 3 pills daily for 2 days, 2 pills daily for 2 days, 1 pill daily for 2 days  Try mucinex to help break up phlegm  Call if not feeling better after finishing prednisone  Follow up in 6 months   Coralyn HellingVineet Shaunak Kreis,  MD [facility] Pulmonary/Critical Care Pager: [phone] 06/01/2016, 12:19 PM

## 2016-06-22 ENCOUNTER — Ambulatory Visit: Payer: BLUE CROSS/BLUE SHIELD | Admitting: Pulmonary Disease

## 2016-12-24 ENCOUNTER — Telehealth: Payer: Self-pay | Admitting: Pulmonary Disease

## 2016-12-24 NOTE — Telephone Encounter (Signed)
Spoke with the pt  He is requesting handicap placard  I advised VS out of the office for the next couple of wks, but will let him know once ready to pick up  Please advise thanks!

## 2016-12-29 NOTE — Telephone Encounter (Signed)
VS please advise.  thanks 

## 2016-12-29 NOTE — Telephone Encounter (Signed)
Form completed and in my office. 

## 2016-12-29 NOTE — Telephone Encounter (Signed)
Will address 12/30/16

## 2016-12-30 NOTE — Telephone Encounter (Signed)
Pt aware that this is ready to be picked up.  Placard placed up front.  Copy placed to scan.  Nothing further needed.

## 2019-09-19 ENCOUNTER — Other Ambulatory Visit: Payer: Self-pay

## 2019-09-19 ENCOUNTER — Ambulatory Visit (INDEPENDENT_AMBULATORY_CARE_PROVIDER_SITE_OTHER): Payer: BC Managed Care – PPO

## 2019-09-19 ENCOUNTER — Ambulatory Visit (INDEPENDENT_AMBULATORY_CARE_PROVIDER_SITE_OTHER): Payer: BC Managed Care – PPO | Admitting: Pulmonary Disease

## 2019-09-19 ENCOUNTER — Encounter: Payer: Self-pay | Admitting: Pulmonary Disease

## 2019-09-19 VITALS — BP 140/82 | HR 72 | Temp 97.7°F | Ht 73.0 in | Wt 247.4 lb

## 2019-09-19 DIAGNOSIS — G4734 Idiopathic sleep related nonobstructive alveolar hypoventilation: Secondary | ICD-10-CM

## 2019-09-19 DIAGNOSIS — J432 Centrilobular emphysema: Secondary | ICD-10-CM

## 2019-09-19 NOTE — Progress Notes (Signed)
[facility]  Chief Complaint  Patient presents with  . Follow-up    Centrilobular emphysema, COPD exacerbation    Constitutional:  BP 140/82 (BP Location: Left Arm, Patient Position: Sitting, Cuff Size: Normal)   Pulse 72   Temp 97.7 F (36.5 C)   Ht 6\' 1"  (1.854 m)   Wt 247 lb 6.4 oz (112.2 kg)   SpO2 94% Comment: on room air  BMI 32.64 kg/m   Past Medical History:  ACE inhibitor cough, Hiatal Hernia, HLD, HTN, Nephrolithiasis, Allergies  Brief Summary:  Robert [name] is a 59 y.o. male former smoker with COPD and emphysema.  I last saw him in 2017.  He is veteran and has been followed at [facility].  He had change in his insurance and wants to re-establish follow up with [place] [facility].  He uses oxygen at night intermittently depending on whether it was a rough day.  Not using oxygen during the day.    Has intermittent cough.  Not having wheeze, chest pain, sputum, hemoptysis, or leg swelling.  Uses albuterol 5 to 10 times per week and this helps.    He got pneumonia shot in 2012.  He prefers to do more "natural" therapies to prevent infections, and would not want pneumonia vaccine or COVID 19 vaccine at this time.   Physical Exam:   Appearance - well kempt   ENMT - clear nasal mucosa, midline nasal  septum, no oral exudates, no LAN, trachea midline  Respiratory - normal chest wall, normal respiratory effort, no accessory muscle use, no wheeze/rales  CV - s1s2 regular rate and rhythm, no murmurs, no peripheral edema, radial pulses symmetric  GI - soft, non tender, no masses  Lymph - no adenopathy noted in neck and axillary areas  MSK - normal gait  Ext - no cyanosis, clubbing, or joint inflammation noted  Skin - no rashes, lesions, or ulcers  Neuro - normal strength, oriented x 3  Psych - normal mood and affect  Assessment/Plan:   COPD with emphysema. - continue symbicort, spiriva, prn proair - gets meds  through the [place] - will repeat CXR, PFT - he will bring form at next visit to complete handicap parking placard application - reviewed pros/cons for pneumonia and COVID 19 vaccinations >> he would like to defer these for now  Nocturnal hypoxia. - secondary to COPD with emphysema - discussed importance of maintaining compliance with supplemental oxygen when need - will repeat overnight oximetry on room air and then determine if he needs to resume using 2 liters oxygen on nightly basis  - advised him to resume 2 liters oxygen at night   Patient Instructions  Chest xray today Will schedule pulmonary function test and overnight oximetry Please bring parking form to your next visit to complete for handicap parking placard Will complete disability forms once above testing is completed  Follow up in 6 weeks   Time spent 33 minutes  Chesley Mires, MD [place] Pager: [phone] 09/19/2019, 11:19 AM  Flow Sheet     Pulmonary tests:  PFT 09/03/10 >>FEV1 1.66(42%), FEV1% 37, TLC 7.87(101%), DLCO 92%, no BD  A1AT 08/07/10 >> normal with MM genotype  Chest imaging:  CT chest 12/10/10 >> RUL 5 mm nodule, centrilobular emphysema CT chest 05/20/11 >> no change  Sleep tests:  Room air ONO 06/29/11 >>Test time 7 hrs 58 min. Baseline SpO2 90.1%, low SpO2 79%, Spent 2 hrs 25 min (30.3%) with SpO2 < 88%.  ONO with 2 liters 07/31/11 >>Test time 7 hrs 2 min. Mean SpO2 94.6%, low SpO2 84%. Spent 4 min 36 sec with SpO2 < 88%.  Medications:   Allergies as of 09/19/2019      Reactions   Ace Inhibitors Cough   Aspirin    REACTION: intolerance   Latex Other (See Comments)   unknown      Medication List       Accurate as of September 19, 2019 11:19 AM. If you have any questions, ask your nurse or doctor.        STOP taking these medications   predniSONE 10 MG tablet Commonly known as: DELTASONE Stopped by: [name], MD     TAKE these medications   amLODipine 5  MG tablet Commonly known as: NORVASC Take 5 mg by mouth daily.   atorvastatin 40 MG tablet Commonly known as: LIPITOR Take 40 mg by mouth at bedtime.   budesonide-formoterol 160-4.5 MCG/ACT inhaler Commonly known as: SYMBICORT Inhale 2 puffs into the lungs 2 (two) times daily. What changed: Another medication with the same name was removed. Continue taking this medication, and follow the directions you see here. Changed by: [name], MD   cetirizine 10 MG tablet Commonly known as: ZYRTEC Take 10 mg by mouth daily.   Fish Oil 1000 MG Caps Take 1,000 mg by mouth 2 (two) times daily.   flunisolide 25 MCG/ACT (0.025%) Soln Commonly known as: NASALIDE Place 2 sprays into the nose daily as needed (allergies).   glipiZIDE 5 MG tablet Commonly known as: GLUCOTROL Take 1 tablet by mouth 2 (two) times daily before a meal.   hydrochlorothiazide 25 MG tablet Commonly known as: HYDRODIURIL Take 1 tablet by mouth daily.   HYDROcodone-acetaminophen 5-325 MG tablet Commonly known as: NORCO/VICODIN Take 1 tablet by mouth 3 (three) times daily as needed for moderate pain.   losartan 100 MG tablet Commonly known as: COZAAR Take 100 mg by mouth daily.   pantoprazole 40 MG tablet Commonly known as: PROTONIX Take 40 mg by mouth 2 (two) times daily.   ProAir HFA 108 (90 Base) MCG/ACT inhaler Generic drug: albuterol Inhale 2 puffs into the lungs every 6 (six) hours as needed for wheezing or shortness of breath.   tiotropium 18 MCG inhalation capsule Commonly known as: SPIRIVA Place 1 capsule (18 mcg total) into inhaler and inhale daily.   urea 20 % cream Commonly known as: CARMOL Apply 1 application topically daily as needed (rash).   Vitamin D3 50 MCG (2000 UT) Tabs Take 2,000 Units by mouth daily.       Past Surgical History:  He  has a past surgical history that includes Knee arthroscopy (Right, 2005).  Family History:  His family history includes Heart disease in his  sister; Pancreatic cancer in his father.  Social History:  He  reports that he quit smoking about 27 years ago. His smoking use included cigarettes. He has a 24.00 pack-year smoking history. He has never used smokeless tobacco. He reports that he does not drink alcohol or use drugs.

## 2019-09-19 NOTE — Patient Instructions (Signed)
Chest xray today Will schedule pulmonary function test and overnight oximetry Please bring parking form to your next visit to complete for handicap parking placard Will complete disability forms once above testing is completed  Follow up in 6 weeks

## 2019-09-21 ENCOUNTER — Telehealth: Payer: Self-pay | Admitting: Pulmonary Disease

## 2019-09-21 NOTE — Telephone Encounter (Signed)
LVMTCB x 1 for patient. 

## 2019-09-21 NOTE — Telephone Encounter (Signed)
DG Chest 2 View  Result Date: 09/19/2019 CLINICAL DATA:  COPD, central lobular emphysema, nocturnal hypoxemia, hypertension EXAM: CHEST - 2 VIEW COMPARISON:  06/09/2015 FINDINGS: Normal heart size, mediastinal contours, and pulmonary vascularity. Emphysematous and minimal bronchitic changes consistent with COPD. New opacity RIGHT upper lobe suspicious for subtle pneumonia. No pleural effusion or pneumothorax. Old fracture of the posterolateral RIGHT eighth rib again seen. IMPRESSION: COPD changes with suspicion of subtle RIGHT upper lobe pneumonia. Electronically Signed   By: Ulyses Southward M.D.   On: 09/19/2019 16:53    Attempted to contact pt to discuss results.  Has RUL opacity.  No symptoms of pneumonia during visit on 09/19/19.  Needs to get CT chest with IV contrast to further assess Rt upper lobe opacity.  He will need to get a BMET to assess renal function prior to getting IV contrast for CT chest.  Will have my nurse contact pt to inform of plan.

## 2019-09-26 ENCOUNTER — Encounter: Payer: Self-pay | Admitting: Pulmonary Disease

## 2019-10-07 ENCOUNTER — Telehealth: Payer: Self-pay | Admitting: Pulmonary Disease

## 2019-10-07 NOTE — Telephone Encounter (Signed)
ONO with RA 09/26/19 >> test time 6 hrs 49 min.  Basal SpO2 82%, low SpO2 67%.  Spent 403.1 min of test time with SpO2 < 88%.   Please let him know that his oxygen level is very low during sleep and he needs to resume wearing 2 liters oxygen at night.

## 2019-10-08 NOTE — Telephone Encounter (Signed)
ATC the pt, there was no answer and his VM is full so unable to leave msg, [facility]

## 2019-10-09 NOTE — Telephone Encounter (Signed)
Attempted to call pt but unable to reach and unable to leave a VM due to mailbox being full. Will try to call back later. 

## 2019-10-29 ENCOUNTER — Encounter: Payer: Self-pay | Admitting: *Deleted

## 2019-10-29 NOTE — Telephone Encounter (Signed)
I tried calling the pt again and there was no answer, and his VM was not yet set up. I have mailed him a letter to call for the results.

## 2019-10-29 NOTE — Telephone Encounter (Signed)
Please confirm that pt has received communication.

## 2019-11-13 ENCOUNTER — Encounter: Payer: Self-pay | Admitting: Pulmonary Disease

## 2019-11-13 ENCOUNTER — Ambulatory Visit (INDEPENDENT_AMBULATORY_CARE_PROVIDER_SITE_OTHER): Payer: BC Managed Care – PPO

## 2019-11-13 ENCOUNTER — Other Ambulatory Visit: Payer: Self-pay

## 2019-11-13 ENCOUNTER — Ambulatory Visit (INDEPENDENT_AMBULATORY_CARE_PROVIDER_SITE_OTHER): Payer: BC Managed Care – PPO | Admitting: Pulmonary Disease

## 2019-11-13 VITALS — BP 118/80 | HR 95 | Ht 73.0 in | Wt 249.0 lb

## 2019-11-13 DIAGNOSIS — G4734 Idiopathic sleep related nonobstructive alveolar hypoventilation: Secondary | ICD-10-CM | POA: Diagnosis not present

## 2019-11-13 DIAGNOSIS — J432 Centrilobular emphysema: Secondary | ICD-10-CM

## 2019-11-13 DIAGNOSIS — J441 Chronic obstructive pulmonary disease with (acute) exacerbation: Secondary | ICD-10-CM

## 2019-11-13 MED ORDER — AZITHROMYCIN 250 MG PO TABS: ORAL_TABLET | ORAL | 0 refills | Status: AC

## 2019-11-13 MED ORDER — PREDNISONE 10 MG PO TABS: ORAL_TABLET | ORAL | 0 refills | Status: AC

## 2019-11-13 NOTE — Patient Instructions (Signed)
Chest xray today  Zithromax 250 mg pill >> 2 pills on day 1, then 1 pill daily for next 4 days  Prednisone 10 mg pill >> 3 pills daily for 2 days, then 2 pills daily for 2 days, then 1 pill daily for 2 days  Will show you how to set up MyChart  Follow up in 4 months

## 2019-11-13 NOTE — Progress Notes (Signed)
[facility]  Chief Complaint  Patient presents with  . Follow-up    Pt states he has been doing okay since last visit and denies any real complaints. Pt states that he still becomes SOB with exertion. Pt states that he does have some tightness in chest.    Constitutional:  BP 118/80 (BP Location: Left Arm, Patient Position: Sitting, Cuff Size: Large)   Pulse 95   Ht 6\' 1"  (1.854 m)   Wt 249 lb (112.9 kg)   SpO2 95% Comment: room air  BMI 32.85 kg/m   Past Medical History:  ACE inhibitor cough, Hiatal Hernia, HLD, HTN, Nephrolithiasis, Allergies  Brief Summary:  Robert [name] is a 59 y.o. male former smoker with COPD and emphysema.  Subjective:  He had CXR at last visit.  Faint Rt upper lobe density.  He didn't answer his phone because he didn't recognize the number.  ONO showed low oxygen on room air.  Uses 2 liters at night intermittently.  Has more cough and wheeze over past few days.  Bringing up clear sputum.  No fever.  Feels tight in chest.   Physical Exam:   Appearance - well kempt   ENMT - no sinus tenderness, no oral exudate, no LAN, no stridor  Respiratory - equal breath sounds bilaterally, faint b/l expiratory wheezing  CV - s1s2 regular rate and rhythm, no murmurs  Ext - no clubbing, no edema  Skin - no rashes  Psych - normal mood and affect   Assessment/Plan:   COPD exacerbation. - will give course of prednisone and zithromax  Rt upper lung density on CXR from February 2021. - repeat CXR today - if persists, then will need CT chest with IV contrast  COPD with emphysema. - gets meds through March 2021 - he opted against pneumonia and COVID 19 vaccinations - continue symbicort, spiriva, prn proair - had PFT scheduled for May >> will complete his disability forms after this is reviewed  Nocturnal hypoxia. - secondary to COPD with emphysema - advised him to use 2 liters oxygen on nightly basis  A total of   33 minutes spent addressing patient care issues on day of visit.   Follow up:   Patient Instructions  Chest xray today  Zithromax 250 mg pill >> 2 pills on day 1, then 1 pill daily for next 4 days  Prednisone 10 mg pill >> 3 pills daily for 2 days, then 2 pills daily for 2 days, then 1 pill daily for 2 days  Will show you how to set up MyChart  Follow up in 4 months   Signature:  June, MD [facility] Pager: [phone] 11/13/2019, 10:24 AM  Flow Sheet     Pulmonary tests:  PFT 09/03/10 >>FEV1 1.66(42%), FEV1% 37, TLC 7.87(101%), DLCO 92%, no BD  A1AT 08/07/10 >> normal with MM genotype  Chest imaging:  CT chest 12/10/10 >> RUL 5 mm nodule, centrilobular emphysema CT chest 05/20/11 >> no change  Sleep tests:  Room air ONO 06/29/11 >>Test time 7 hrs 58 min. Baseline SpO2 90.1%, low SpO2 79%, Spent 2 hrs 25 min (30.3%) with SpO2 < 88%. ONO with 2 liters 07/31/11 >>Test time 7 hrs 2 min. Mean SpO2 94.6%, low SpO2 84%. Spent 4 min 36 sec with SpO2 < 88%. ONO with RA 09/26/19 >> test time 6 hrs 49 min.  Basal SpO2 82%, low SpO2 67%.  Spent 403.1 min of test time with SpO2 < 88%.  Medications:   Allergies as of 11/13/2019      Reactions   Ace Inhibitors Cough   Aspirin    REACTION: intolerance   Latex Other (See Comments)   unknown      Medication List       Accurate as of November 13, 2019 10:24 AM. If you have any questions, ask your nurse or doctor.        amLODipine 5 MG tablet Commonly known as: NORVASC Take 5 mg by mouth daily.   atorvastatin 40 MG tablet Commonly known as: LIPITOR Take 40 mg by mouth at bedtime.   azithromycin 250 MG tablet Commonly known as: ZITHROMAX Take 2 tablets (500 mg total) by mouth daily for 1 day, THEN 1 tablet (250 mg total) daily for 4 days. Start taking on: November 13, 2019 Started by: Chesley Mires, MD   budesonide-formoterol 160-4.5 MCG/ACT inhaler Commonly known as: SYMBICORT Inhale 2 puffs into  the lungs 2 (two) times daily.   cetirizine 10 MG tablet Commonly known as: ZYRTEC Take 10 mg by mouth daily.   Fish Oil 1000 MG Caps Take 1,000 mg by mouth 2 (two) times daily.   flunisolide 25 MCG/ACT (0.025%) Soln Commonly known as: NASALIDE Place 2 sprays into the nose daily as needed (allergies).   glipiZIDE 5 MG tablet Commonly known as: GLUCOTROL Take 1 tablet by mouth 2 (two) times daily before a meal.   hydrochlorothiazide 25 MG tablet Commonly known as: HYDRODIURIL Take 1 tablet by mouth daily.   HYDROcodone-acetaminophen 5-325 MG tablet Commonly known as: NORCO/VICODIN Take 1 tablet by mouth 3 (three) times daily as needed for moderate pain.   losartan 100 MG tablet Commonly known as: COZAAR Take 100 mg by mouth daily.   pantoprazole 40 MG tablet Commonly known as: PROTONIX Take 40 mg by mouth 2 (two) times daily.   predniSONE 10 MG tablet Commonly known as: DELTASONE Take 3 tablets (30 mg total) by mouth daily with breakfast for 2 days, THEN 2 tablets (20 mg total) daily with breakfast for 2 days, THEN 1 tablet (10 mg total) daily with breakfast for 2 days. Start taking on: November 13, 2019 Started by: Chesley Mires, MD   ProAir HFA 108 [phone] Base) MCG/ACT inhaler Generic drug: albuterol Inhale 2 puffs into the lungs every 6 (six) hours as needed for wheezing or shortness of breath.   tiotropium 18 MCG inhalation capsule Commonly known as: SPIRIVA Place 1 capsule (18 mcg total) into inhaler and inhale daily.   urea 20 % cream Commonly known as: CARMOL Apply 1 application topically daily as needed (rash).   Vitamin D3 50 MCG (2000 UT) Tabs Take 2,000 Units by mouth daily.       Past Surgical History:  He  has a past surgical history that includes Knee arthroscopy (Right, 2005).  Family History:  His family history includes Heart disease in his sister; Pancreatic cancer in his father.  Social History:  He  reports that he quit smoking about 28 years  ago. His smoking use included cigarettes. He has a 24.00 pack-year smoking history. He has never used smokeless tobacco. He reports that he does not drink alcohol or use drugs.

## 2019-11-15 ENCOUNTER — Telehealth: Payer: Self-pay | Admitting: Pulmonary Disease

## 2019-11-15 NOTE — Telephone Encounter (Signed)
DG Chest 2 View  Result Date: 11/13/2019 CLINICAL DATA:  59 year old male with a history of COPD exacerbation EXAM: CHEST - 2 VIEW COMPARISON:  09/19/2019 FINDINGS: Cardiomediastinal silhouette unchanged in size and contour. Stigmata of emphysema, with increased retrosternal airspace, flattened hemidiaphragms, increased AP diameter, and hyperinflation on the AP view. Coarsened interstitial markings with no new confluent airspace disease. No pneumothorax or pleural effusion. Degenerative changes of the spine.  No displaced fracture IMPRESSION: Chronic changes and emphysema without evidence of acute cardiopulmonary disease. Electronically Signed   By: Gilmer Mor D.O.   On: 11/13/2019 16:05    Please let him know that area in upper part of right lung seen on CXR from 09/19/19 has cleared up.

## 2019-11-16 NOTE — Telephone Encounter (Signed)
Left message for patient to call back  

## 2019-11-20 NOTE — Telephone Encounter (Signed)
Left message for patient to call back  

## 2019-11-21 NOTE — Telephone Encounter (Signed)
Left message for patient to call back. Letter will be sent since this was the 3rd attempt.

## 2019-11-26 ENCOUNTER — Other Ambulatory Visit (HOSPITAL_COMMUNITY)
Admission: RE | Admit: 2019-11-26 | Discharge: 2019-11-26 | Disposition: A | Discharge status: Home / Self Care | Payer: BC Managed Care – PPO | Source: Ambulatory Visit | Attending: Pulmonary Disease | Admitting: Pulmonary Disease

## 2019-11-26 DIAGNOSIS — Z01812 Encounter for preprocedural laboratory examination: Secondary | ICD-10-CM | POA: Diagnosis not present

## 2019-11-26 DIAGNOSIS — Z20822 Contact with and (suspected) exposure to covid-19: Secondary | ICD-10-CM | POA: Insufficient documentation

## 2019-11-26 LAB — SARS CORONAVIRUS 2 (TAT 6-24 HRS): SARS Coronavirus 2: NEGATIVE

## 2019-11-30 ENCOUNTER — Other Ambulatory Visit (HOSPITAL_COMMUNITY)
Admission: RE | Admit: 2019-11-30 | Discharge: 2019-11-30 | Disposition: A | Discharge status: Home / Self Care | Payer: BC Managed Care – PPO | Source: Ambulatory Visit | Attending: Pulmonary Disease | Admitting: Pulmonary Disease

## 2019-11-30 DIAGNOSIS — Z20822 Contact with and (suspected) exposure to covid-19: Secondary | ICD-10-CM | POA: Insufficient documentation

## 2019-11-30 DIAGNOSIS — Z01812 Encounter for preprocedural laboratory examination: Secondary | ICD-10-CM | POA: Insufficient documentation

## 2019-11-30 LAB — SARS CORONAVIRUS 2 (TAT 6-24 HRS): SARS Coronavirus 2: NEGATIVE

## 2019-12-03 ENCOUNTER — Ambulatory Visit (INDEPENDENT_AMBULATORY_CARE_PROVIDER_SITE_OTHER): Payer: BC Managed Care – PPO | Admitting: Pulmonary Disease

## 2019-12-03 ENCOUNTER — Other Ambulatory Visit: Payer: Self-pay

## 2019-12-03 DIAGNOSIS — J432 Centrilobular emphysema: Secondary | ICD-10-CM | POA: Diagnosis not present

## 2019-12-03 DIAGNOSIS — G4734 Idiopathic sleep related nonobstructive alveolar hypoventilation: Secondary | ICD-10-CM

## 2019-12-03 LAB — PULMONARY FUNCTION TEST
DL/VA % pred: 149 %
DL/VA: 6.25 ml/min/mmHg/L
DLCO cor % pred: 100 %
DLCO cor: 31.58 ml/min/mmHg
DLCO unc % pred: 100 %
DLCO unc: 31.58 ml/min/mmHg
FEF 25-75 Post: 0.84 L/sec
FEF 25-75 Pre: 0.57 L/sec
FEF2575-%Change-Post: 47 %
FEF2575-%Pred-Post: 25 %
FEF2575-%Pred-Pre: 17 %
FEV1-%Change-Post: 11 %
FEV1-%Pred-Post: 42 %
FEV1-%Pred-Pre: 37 %
FEV1-Post: 1.53 L
FEV1-Pre: 1.38 L
FEV1FVC-%Change-Post: 0 %
FEV1FVC-%Pred-Pre: 62 %
FEV6-%Change-Post: 14 %
FEV6-%Pred-Post: 68 %
FEV6-%Pred-Pre: 60 %
FEV6-Post: 3.1 L
FEV6-Pre: 2.71 L
FEV6FVC-%Change-Post: 3 %
FEV6FVC-%Pred-Post: 102 %
FEV6FVC-%Pred-Pre: 99 %
FVC-%Change-Post: 10 %
FVC-%Pred-Post: 67 %
FVC-%Pred-Pre: 60 %
FVC-Post: 3.12 L
FVC-Pre: 2.82 L
Post FEV1/FVC ratio: 49 %
Post FEV6/FVC ratio: 99 %
Pre FEV1/FVC ratio: 49 %
Pre FEV6/FVC Ratio: 96 %
RV % pred: 177 %
RV: 4.33 L
TLC % pred: 93 %
TLC: 7.3 L

## 2019-12-03 NOTE — Progress Notes (Signed)
Full PFT performed today. °

## 2019-12-17 ENCOUNTER — Telehealth: Payer: Self-pay | Admitting: Pulmonary Disease

## 2019-12-17 NOTE — Telephone Encounter (Signed)
  PFT 12/03/19 >> FEV1 1.53 (42%), FEV1% 49, TLC 7.30 (93%), RV 4.33 (177%), DLCO 100%, +BD   Please let him know his PFT shows moderate to severe COPD.  He should continue using symbicort and spiriva.  He can also pick up his forms for [employer] - these were forms he needed to complete on his own and there wasn't anything our office needed to complete.

## 2020-01-03 NOTE — Telephone Encounter (Signed)
Left message for patient to call back  

## 2021-01-19 ENCOUNTER — Emergency Department (HOSPITAL_COMMUNITY): Payer: No Typology Code available for payment source

## 2021-01-19 ENCOUNTER — Emergency Department (HOSPITAL_COMMUNITY)
Admission: EM | Admit: 2021-01-19 | Discharge: 2021-01-19 | Discharge status: Against Medical Advice | Payer: No Typology Code available for payment source | Attending: Emergency Medicine | Admitting: Emergency Medicine

## 2021-01-19 ENCOUNTER — Encounter (HOSPITAL_COMMUNITY): Payer: Self-pay | Admitting: Emergency Medicine

## 2021-01-19 ENCOUNTER — Other Ambulatory Visit: Payer: Self-pay

## 2021-01-19 DIAGNOSIS — R0789 Other chest pain: Secondary | ICD-10-CM | POA: Insufficient documentation

## 2021-01-19 DIAGNOSIS — E119 Type 2 diabetes mellitus without complications: Secondary | ICD-10-CM | POA: Insufficient documentation

## 2021-01-19 DIAGNOSIS — R0682 Tachypnea, not elsewhere classified: Secondary | ICD-10-CM | POA: Diagnosis not present

## 2021-01-19 DIAGNOSIS — R000 Tachycardia, unspecified: Secondary | ICD-10-CM | POA: Insufficient documentation

## 2021-01-19 DIAGNOSIS — Z87891 Personal history of nicotine dependence: Secondary | ICD-10-CM | POA: Diagnosis not present

## 2021-01-19 DIAGNOSIS — Z7984 Long term (current) use of oral hypoglycemic drugs: Secondary | ICD-10-CM | POA: Insufficient documentation

## 2021-01-19 DIAGNOSIS — Z79899 Other long term (current) drug therapy: Secondary | ICD-10-CM | POA: Insufficient documentation

## 2021-01-19 DIAGNOSIS — J449 Chronic obstructive pulmonary disease, unspecified: Secondary | ICD-10-CM | POA: Insufficient documentation

## 2021-01-19 DIAGNOSIS — R079 Chest pain, unspecified: Secondary | ICD-10-CM

## 2021-01-19 DIAGNOSIS — Z7951 Long term (current) use of inhaled steroids: Secondary | ICD-10-CM | POA: Insufficient documentation

## 2021-01-19 DIAGNOSIS — J189 Pneumonia, unspecified organism: Secondary | ICD-10-CM

## 2021-01-19 DIAGNOSIS — Z28310 Unvaccinated for covid-19: Secondary | ICD-10-CM | POA: Diagnosis not present

## 2021-01-19 DIAGNOSIS — R0902 Hypoxemia: Secondary | ICD-10-CM

## 2021-01-19 DIAGNOSIS — Z91040 Latex allergy status: Secondary | ICD-10-CM | POA: Insufficient documentation

## 2021-01-19 DIAGNOSIS — Z20822 Contact with and (suspected) exposure to covid-19: Secondary | ICD-10-CM | POA: Diagnosis not present

## 2021-01-19 DIAGNOSIS — I10 Essential (primary) hypertension: Secondary | ICD-10-CM | POA: Diagnosis not present

## 2021-01-19 LAB — CBC
HCT: 45.8 % (ref 39.0–52.0)
Hemoglobin: 15.1 g/dL (ref 13.0–17.0)
MCH: 29.5 pg (ref 26.0–34.0)
MCHC: 33 g/dL (ref 30.0–36.0)
MCV: 89.6 fL (ref 80.0–100.0)
Platelets: 265 10*3/uL (ref 150–400)
RBC: 5.11 MIL/uL (ref 4.22–5.81)
RDW: 13.3 % (ref 11.5–15.5)
WBC: 10.3 10*3/uL (ref 4.0–10.5)
nRBC: 0 % (ref 0.0–0.2)

## 2021-01-19 LAB — RESP PANEL BY RT-PCR (FLU A&B, COVID) ARPGX2
Influenza A by PCR: NEGATIVE
Influenza B by PCR: NEGATIVE
SARS Coronavirus 2 by RT PCR: NEGATIVE

## 2021-01-19 LAB — BASIC METABOLIC PANEL
Anion gap: 11 (ref 5–15)
BUN: 12 mg/dL (ref 6–20)
CO2: 32 mmol/L (ref 22–32)
Calcium: 8.9 mg/dL (ref 8.9–10.3)
Chloride: 92 mmol/L — ABNORMAL LOW (ref 98–111)
Creatinine, Ser: 1.09 mg/dL (ref 0.61–1.24)
GFR, Estimated: >60 mL/min (ref >60)
Glucose, Bld: 419 mg/dL — ABNORMAL HIGH (ref 70–99)
Potassium: 4.2 mmol/L (ref 3.5–5.1)
Sodium: 135 mmol/L (ref 135–145)

## 2021-01-19 LAB — TROPONIN I (HIGH SENSITIVITY)
Troponin I (High Sensitivity): 9 ng/L (ref <18)
Troponin I (High Sensitivity): 9 ng/L (ref <18)

## 2021-01-19 LAB — PROTIME-INR
INR: 1.1 (ref 0.8–1.2)
Prothrombin Time: 14.1 seconds (ref 11.4–15.2)

## 2021-01-19 LAB — BRAIN NATRIURETIC PEPTIDE: B Natriuretic Peptide: 28.1 pg/mL (ref 0.0–100.0)

## 2021-01-19 MED ORDER — DOXYCYCLINE HYCLATE 100 MG PO TABS: 100.0000 mg | ORAL_TABLET | Freq: Once | ORAL | Status: DC

## 2021-01-19 MED ORDER — ALBUTEROL SULFATE (2.5 MG/3ML) 0.083% IN NEBU: 5.0000 mg | INHALATION_SOLUTION | Freq: Once | RESPIRATORY_TRACT | Status: DC

## 2021-01-19 MED ORDER — AMOXICILLIN-POT CLAVULANATE 875-125 MG PO TABS: 1.0000 | ORAL_TABLET | Freq: Once | ORAL | Status: DC

## 2021-01-19 MED ORDER — AEROCHAMBER PLUS FLO-VU LARGE MISC: 1.0000 | Freq: Once | Status: DC

## 2021-01-19 MED ORDER — SODIUM CHLORIDE 0.9 % IV SOLN: 1.0000 g | Freq: Once | INTRAVENOUS | Status: DC

## 2021-01-19 MED ORDER — AMOXICILLIN-POT CLAVULANATE 875-125 MG PO TABS: 1.0000 | ORAL_TABLET | Freq: Two times a day (BID) | ORAL | 0 refills | Status: DC

## 2021-01-19 MED ORDER — ACETAMINOPHEN 500 MG PO TABS
1000.0000 mg | ORAL_TABLET | Freq: Once | ORAL | Status: AC
  Administered 2021-01-19: 1000 mg via ORAL
  Filled 2021-01-19: qty 2

## 2021-01-19 MED ORDER — DOXYCYCLINE HYCLATE 100 MG PO CAPS: 100.0000 mg | ORAL_CAPSULE | Freq: Two times a day (BID) | ORAL | 0 refills | Status: DC

## 2021-01-19 MED ORDER — SODIUM CHLORIDE 0.9 % IV SOLN: 500.0000 mg | Freq: Once | INTRAVENOUS | Status: DC

## 2021-01-19 MED ORDER — ALBUTEROL SULFATE HFA 108 (90 BASE) MCG/ACT IN AERS: 2.0000 | INHALATION_SPRAY | RESPIRATORY_TRACT | Status: DC | PRN

## 2021-01-19 MED ORDER — IOHEXOL 350 MG/ML SOLN
100.0000 mL | Freq: Once | INTRAVENOUS | Status: AC | PRN
  Administered 2021-01-19: 100 mL via INTRAVENOUS

## 2021-01-19 NOTE — ED Notes (Signed)
Instructions for abx and home care discussed with patient.

## 2021-01-19 NOTE — Discharge Instructions (Addendum)
You were seen in the ER today for chest pain.  Your oxygen levels were lower requiring supplemental oxygen to become normal. Your CT scan could not rule out a blood clot and showed findings of pneumonia. Your blood sugar was also elevated. We do not have your second heart enzyme back to evaluate for a heart attack.   We recommended you remain in the hospital for admission for further evaluation and management which you refused.   Please purchase an at home pulse oximeter to track your oxygen at home, wear your oxygen at home.   We are sending you home with augmentin and doxycycline- antibiotics to treat for pneumonia.   We have prescribed you new medication(s) today. Discuss the medications prescribed today with your pharmacist as they can have adverse effects and interactions with your other medicines including over the counter and prescribed medications. Seek medical evaluation if you start to experience new or abnormal symptoms after taking one of these medicines, seek care immediately if you start to experience difficulty breathing, feeling of your throat closing, facial swelling, or rash as these could be indications of a more serious allergic reaction  Please follow up with primary care within 48 hours for a recheck.   You may return to the ER at any time for further treatment.   Please return immediately for new or worsening symptoms including but not limited to new or worsening pain, inability to keep fluids down, trouble breathing, coughing up blood, passing out, dizziness, or any other concerns.

## 2021-01-19 NOTE — ED Notes (Signed)
Pt noted to be 81% RA. Pt adamant about leaving ama. PA aware.

## 2021-01-19 NOTE — ED Provider Notes (Signed)
[facility] EMERGENCY DEPARTMENT Provider Note   CSN: [id number] Arrival date & time: 01/19/21  0255     History Chief Complaint  Patient presents with   Chest Pain    Robert [name] is a 60 y.o. male presents to the Emergency Department complaining of gradual, persistent, progressively worsening middle and left-sided chest pain onset around 2:30 AM which woke him from sleep.  Patient reports he is never had chest pain like this before.  Reports no significant shortness of breath.  States he has a history of emphysema/COPD however does not currently smoke and does not utilize albuterol at home.  Reports he does have home oxygen however he never needs to use it.  Patient is not vaccinated for COVID-19.  No known sick contacts.  No specific aggravating or alleviating factors for his chest pain.  Reports ambulation does not make his chest pain worse.  Denies nausea, diaphoresis, vomiting.   The history is provided by the patient and medical records. No language interpreter was used.   HPI: A 60 year old patient with a history of treated diabetes, hypertension, hypercholesterolemia and obesity presents for evaluation of chest pain. Initial onset of pain was approximately 1-3 hours ago. The patient's chest pain is described as heaviness/pressure/tightness and is not worse with exertion. The patient's chest pain is middle- or left-sided, is not well-localized, is not sharp and does not radiate to the arms/jaw/neck. The patient does not complain of nausea and denies diaphoresis. The patient has no history of stroke, has no history of peripheral artery disease, has not smoked in the past 90 days and has no relevant family history of coronary artery disease (first degree relative at less than age 92).   Past Medical History:  Diagnosis Date   ACE-inhibitor cough    Acute respiratory failure [facility]) January 2015   2nd to AECOPD, required intubation   COPD with emphysema (HCC)         Hiatal hernia    Hyperlipidemia    Hypertension    Nephrolithiasis    Nocturnal hypoxemia due to emphysema [facility])    Pulmonary nodule, right 12/10/2010   Seasonal allergies     Patient Active Problem List   Diagnosis Date Noted   Dyspnea 05/26/2014   Depression 05/26/2014   Acid indigestion 08/04/2013   Nocturnal hypoxemia due to emphysema (HCC) 07/15/2011   ALLERGIC RHINITIS 08/07/2010   COPD with emphysema (HCC) 08/07/2010    Past Surgical History:  Procedure Laterality Date   KNEE ARTHROSCOPY Right 2005   x 2 -had one in the service.       Family History  Problem Relation Age of Onset   Pancreatic cancer Father    Heart disease Sister     Social History   Tobacco Use   Smoking status: Former    Packs/day: 1.50    Years: 16.00    Pack years: 24.00    Types: Cigarettes    Quit date: 10/25/1991    Years since quitting: 29.2   Smokeless tobacco: Never  Substance Use Topics   Alcohol use: No    Comment: rare   Drug use: No    Home Medications Prior to Admission medications   Medication Sig Start Date End Date Taking? Authorizing Provider  albuterol (VENTOLIN HFA) 108 (90 Base) MCG/ACT inhaler Inhale 2 puffs into the lungs every 6 (six) hours as needed for wheezing or shortness of breath.   Yes [provider]  amLODipine (NORVASC) 5 MG tablet Take 5  mg by mouth daily.    Yes [provider]  atorvastatin (LIPITOR) 40 MG tablet Take 40 mg by mouth at bedtime.    Yes [provider]  budesonide-formoterol (SYMBICORT) 160-4.5 MCG/ACT inhaler Inhale 2 puffs into the lungs 2 (two) times daily.   Yes [provider]  cetirizine (ZYRTEC) 10 MG tablet Take 10 mg by mouth daily.     Yes [provider]  Cholecalciferol (VITAMIN D3) 2000 UNITS TABS Take 2,000 Units by mouth daily.   Yes [provider]  flunisolide (NASALIDE) 25 MCG/ACT (0.025%) SOLN Place 1 spray into the nose 2 (two) times daily as needed (allergies).    Yes [provider]  fluticasone-salmeterol (ADVAIR) 250-50 MCG/ACT AEPB Inhale 1 puff into the lungs in the morning and at bedtime. 11/13/20  Yes [provider]  glipiZIDE (GLUCOTROL) 5 MG tablet Take 1 tablet by mouth 2 (two) times daily before a meal. 10/28/15  Yes [provider]  hydrochlorothiazide (HYDRODIURIL) 25 MG tablet Take 1 tablet by mouth daily. 04/03/15  Yes [provider]  HYDROcodone-acetaminophen (NORCO/VICODIN) 5-325 MG per tablet Take 1 tablet by mouth 3 (three) times daily as needed for moderate pain.   Yes [provider]  losartan (COZAAR) 100 MG tablet Take 100 mg by mouth daily.     Yes [provider]  Omega-3 Fatty Acids (FISH OIL) 1000 MG CAPS Take 1,000 mg by mouth 2 (two) times daily.   Yes [provider]  pantoprazole (PROTONIX) 40 MG tablet Take 40 mg by mouth 2 (two) times daily.   Yes [provider]  pioglitazone (ACTOS) 30 MG tablet Take 30 mg by mouth daily. 12/22/20  Yes [provider]  urea (CARMOL) 20 % cream Apply 1 application topically daily as needed (rash).   Yes [provider]  tiotropium (SPIRIVA) 18 MCG inhalation capsule Place 1 capsule (18 mcg total) into inhaler and inhale daily. Patient not taking: No sig reported 11/16/10   Coralyn HellingSood, Vineet, MD    Allergies    Ace inhibitors, Aspirin, and Latex  Review of Systems   Review of Systems  Constitutional:  Negative for appetite change, diaphoresis, fatigue, fever and unexpected weight change.  HENT:  Negative for mouth sores.   Eyes:  Negative for visual disturbance.  Respiratory:  Positive for chest tightness. Negative for cough, shortness of breath and wheezing.   Cardiovascular:  Positive for chest pain.  Gastrointestinal:  Negative for abdominal pain, constipation, diarrhea, nausea and vomiting.  Endocrine: Negative for polydipsia, polyphagia and polyuria.  Genitourinary:  Negative for dysuria, frequency,  hematuria and urgency.  Musculoskeletal:  Negative for back pain and neck stiffness.  Skin:  Negative for rash.  Allergic/Immunologic: Negative for immunocompromised state.  Neurological:  Negative for syncope, light-headedness and headaches.  Hematological:  Does not bruise/bleed easily.  Psychiatric/Behavioral:  Negative for sleep disturbance. The patient is not nervous/anxious.    Physical Exam Updated Vital Signs BP (!) 164/94 (BP Location: Right Arm)   Pulse (!) 109   Temp (!) 102.1 F (38.9 C) (Rectal)   Resp 19   Ht 6\' 1"  (1.854 m)   Wt 120 kg   SpO2 93%   BMI 34.90 kg/m   Physical Exam Vitals and nursing note reviewed.  Constitutional:      General: He is not in acute distress.    Appearance: He is ill-appearing. He is not diaphoretic.     Comments: Hot to touch  HENT:  Head: Normocephalic.  Eyes:     General: No scleral icterus.    Conjunctiva/sclera: Conjunctivae normal.  Cardiovascular:     Rate and Rhythm: Regular rhythm. Tachycardia present.     Pulses: Normal pulses.          Radial pulses are 2+ on the right side and 2+ on the left side.  Pulmonary:     Effort: Tachypnea present. No accessory muscle usage, prolonged expiration, respiratory distress or retractions.     Breath sounds: No stridor. Decreased breath sounds (throughout) present.     Comments: Equal chest rise. No increased work of breathing. Abdominal:     General: There is no distension.     Palpations: Abdomen is soft.     Tenderness: There is no abdominal tenderness. There is no guarding or rebound.  Musculoskeletal:     Cervical back: Normal range of motion.     Right lower leg: No tenderness. No edema.     Left lower leg: No tenderness. No edema.     Comments: Moves all extremities equally and without difficulty.  Skin:    General: Skin is warm and dry.     Capillary Refill: Capillary refill takes less than 2 seconds.  Neurological:     Mental Status: He is alert.     GCS: GCS  eye subscore is 4. GCS verbal subscore is 5. GCS motor subscore is 6.     Comments: Speech is clear and goal oriented.  Psychiatric:        Mood and Affect: Mood normal.    ED Results / Procedures / Treatments   Labs (all labs ordered are listed, but only abnormal results are displayed) Labs Reviewed  BASIC METABOLIC PANEL - Abnormal; Notable for the following components:      Result Value   Chloride 92 (*)    Glucose, Bld 419 (*)    All other components within normal limits  RESP PANEL BY RT-PCR (FLU A&B, COVID) ARPGX2  CBC  PROTIME-INR  BRAIN NATRIURETIC PEPTIDE  TROPONIN I (HIGH SENSITIVITY)  TROPONIN I (HIGH SENSITIVITY)    EKG EKG Interpretation  Date/Time:  Monday January 19 2021 02:59:48 EDT Ventricular Rate:  113 PR Interval:  196 QRS Duration: 86 QT Interval:  316 QTC Calculation: 433 R Axis:   -26 Text Interpretation: Sinus tachycardia with Premature atrial complexes Anterior infarct , age undetermined Abnormal ECG Confirmed by Zadie Rhine ([id number]) on 01/19/2021 4:28:30 AM  Radiology DG Chest 2 View  Result Date: 01/19/2021 CLINICAL DATA:  Chest pain EXAM: CHEST - 2 VIEW COMPARISON:  11/13/2019 FINDINGS: Hyperinflation. Mild cardiomegaly. No confluent airspace opacities. No effusions. Old healed bilateral rib fractures. IMPRESSION: Emphysema. No active disease. Electronically Signed   By: Charlett Nose M.D.   On: 01/19/2021 03:41    Procedures Procedures   Medications Ordered in ED Medications  albuterol (VENTOLIN HFA) 108 (90 Base) MCG/ACT inhaler 2 puff (has no administration in time range)  AeroChamber Plus Flo-Vu Large MISC 1 each (has no administration in time range)  acetaminophen (TYLENOL) tablet 1,000 mg (1,000 mg Oral Given 01/19/21 0737)    ED Course  I have reviewed the triage vital signs and the nursing notes.  Pertinent labs & imaging results that were available during my care of the patient were reviewed by me and considered in my medical  decision making (see chart for details).  Clinical Course as of 01/19/21 0642  Mon Jan 19, 2021  0458 SpO2(!): 88 % Hypoxia on arrival [  HM]  0458 SPo2 drops to 88% at rest when on room air. [HM]  0529 Temp(!): 102.1 F (38.9 C) Febrile - suspect COVID [HM]  0530 EKG 12-Lead Unchanged from January 2022 [HM]  0530 Troponin I (High Sensitivity): 9 WNL [HM]  0530 Pulse Rate(!): 109 Tachycardic, tachypneic and hypoxic on room air [HM]  0530 Glucose(!): 419 high [HM]    Clinical Course User Index [HM] Joyanna Kleman, Boyd Kerbs   MDM Rules/Calculators/A&P HEAR Score: 5                        Patient presents with several hours of chest pain.  No arrives tachycardic, tachypneic and hypoxic.  Hot to touch.  Rectal temp with fever of 102.1.  Diminished breath sounds throughout but not specific wheezing or accessory muscle usage.  Patient on 2 L via nasal cannula to maintain oxygen saturation at 93%.  Abdomen soft and nontender.  Concern for COVID-19 versus pulmonary embolism.  EKG is without acute ischemia and is unchanged from January.  Initial troponin negative.  Patient will need admission for hypoxia CT scan pending.  The patient was discussed with and evaluated by Dr. Bebe Shaggy who agrees with the treatment plan.   6:42 AM At shift change care was transferred to [facility] who will follow pending studies, re-evaulate and determine disposition.     Final Clinical Impression(s) / ED Diagnoses Final diagnoses:  Nonspecific chest pain  Hypoxia    Rx / DC Orders ED Discharge Orders     None        Keldon Lassen, Boyd Kerbs 01/19/21 2956    Zadie Rhine, MD 01/19/21 [phone]

## 2021-01-19 NOTE — ED Triage Notes (Signed)
Patient reports left chest pain with SOB this morning , no emesis or diaphoresis .

## 2021-01-19 NOTE — ED Provider Notes (Signed)
06:30: Assumed care of patient from [facility] PA-C at change of shift pending CTA & admission.   Please see prior provider note for full h&p Briefly patient is a 60 year old male who presented to the ED with chest pain, found to be febrile & hypoxic. No wheezing to raise concern for COPD exacerbation. Concern for covid vs. Bacterial pneumonia vs. PE. Plan is to follow up on RVP and CTA with consult for admission.   Physical Exam  BP (!) 137/92   Pulse (!) 102   Temp (!) 102.1 F (38.9 C) (Rectal)   Resp (!) 27   Ht 6\' 1"  (1.854 m)   Wt 120 kg   SpO2 96%   BMI 34.90 kg/m   Physical Exam Vitals and nursing note reviewed.  Constitutional:      Appearance: He is not toxic-appearing.  Pulmonary:     Breath sounds: No wheezing.     Comments: SpO2 82-86% on RA- patient refusing supplemental O2. Mildly tachypneic at times, not in overt respiratory distress.  Neurological:     Mental Status: He is alert.    ED Course/Procedures   Results for orders placed or performed during the hospital encounter of 01/19/21  Basic metabolic panel  Result Value Ref Range   Sodium 135 135 - 145 mmol/L   Potassium 4.2 3.5 - 5.1 mmol/L   Chloride 92 (L) 98 - 111 mmol/L   CO2 32 22 - 32 mmol/L   Glucose, Bld 419 (H) 70 - 99 mg/dL   BUN 12 6 - 20 mg/dL   Creatinine, Ser 01/21/21 0.61 - 1.24 mg/dL   Calcium 8.9 8.9 - 3.66 mg/dL   GFR, Estimated 44.0 >34 mL/min   Anion gap 11 5 - 15  CBC  Result Value Ref Range   WBC 10.3 4.0 - 10.5 K/uL   RBC 5.11 4.22 - 5.81 MIL/uL   Hemoglobin 15.1 13.0 - 17.0 g/dL   HCT >74 25.9 - 56.3 %   MCV 89.6 80.0 - 100.0 fL   MCH 29.5 26.0 - 34.0 pg   MCHC 33.0 30.0 - 36.0 g/dL   RDW 87.5 64.3 - 32.9 %   Platelets 265 150 - 400 K/uL   nRBC 0.0 0.0 - 0.2 %  Protime-INR (order if Patient is taking Coumadin / Warfarin)  Result Value Ref Range   Prothrombin Time 14.1 11.4 - 15.2 seconds   INR 1.1 0.8 - 1.2  Troponin I (High Sensitivity)  Result Value Ref  Range   Troponin I (High Sensitivity) 9 <18 ng/L   DG Chest 2 View  Result Date: 01/19/2021 CLINICAL DATA:  Chest pain EXAM: CHEST - 2 VIEW COMPARISON:  11/13/2019 FINDINGS: Hyperinflation. Mild cardiomegaly. No confluent airspace opacities. No effusions. Old healed bilateral rib fractures. IMPRESSION: Emphysema. No active disease. Electronically Signed   By: 11/15/2019 M.D.   On: 01/19/2021 03:41   CT Angio Chest PE W and/or Wo Contrast  Result Date: 01/19/2021 CLINICAL DATA:  Left chest pain, dyspnea EXAM: CT ANGIOGRAPHY CHEST WITH CONTRAST TECHNIQUE: Multidetector CT imaging of the chest was performed using the standard protocol during bolus administration of intravenous contrast. Multiplanar CT image reconstructions and MIPs were obtained to evaluate the vascular anatomy. CONTRAST:  01/21/2021 OMNIPAQUE IOHEXOL 350 MG/ML SOLN COMPARISON:  None. FINDINGS: Cardiovascular: The pulmonary arterial tree is suboptimally opacified due to bolus timing with appropriate opacification only of the a main and central right and left pulmonary arteries. The terminal right and left pulmonary  arteries, lobar, segmental, and subsegmental pulmonary arteries are not adequately opacified to allow the exclusion of a small pulmonary emboli. The central pulmonary arteries are of normal caliber. No significant coronary artery calcification. Global cardiac size within normal limits. No pericardial effusion. The thoracic aorta is of normal caliber. Mild mixed atherosclerotic plaque within the descending thoracic aorta. Mediastinum/Nodes: The thyroid gland is unremarkable. The esophagus is unremarkable. There is mild pathologic adenopathy involving the bilateral hilar, precarinal and subcarinal lymph node groups with the index lymph node measuring 14 mm in short axis diameter at axial image # 61/5. Lungs/Pleura: Multifocal nodular infiltrates are identified within the upper lobes bilaterally and superior segment of the left lower  lobe, likely infectious or inflammatory in the acute setting. No pneumothorax or pleural effusion. No central obstructing lesion. Upper Abdomen: No acute abnormality. Musculoskeletal: No acute bone abnormality. No lytic or blastic bone lesion identified. Review of the MIP images confirms the above findings. IMPRESSION: Technically limited examination without evidence of large central pulmonary embolus. The terminal right and left pulmonary arteries, lobar, segmental, and subsegmental pulmonary arteries are not adequately opacified on this examination to definitively exclude pulmonary embolus. Multifocal pulmonary infiltrate, likely infectious or inflammatory in the acute setting. Borderline pathologic adenopathy is likely reactive in nature. No central obstructing lesion. Electronically Signed   By: Helyn Numbers MD   On: 01/19/2021 07:04    Clinical Course as of 01/19/21 0717  Mon Jan 19, 2021  0458 SpO2(!): 88 % Hypoxia on arrival [HM]  0458 SPo2 drops to 88% at rest when on room air. [HM]  0529 Temp(!): 102.1 F (38.9 C) Febrile - suspect COVID [HM]  0530 EKG 12-Lead Unchanged from January 2022 [HM]  0530 Troponin I (High Sensitivity): 9 WNL [HM]  0530 Pulse Rate(!): 109 Tachycardic, tachypneic and hypoxic on room air [HM]  0530 Glucose(!): 419 high [HM]    Clinical Course User Index [HM] Muthersbaugh, Dahlia Client, PA-C    Procedures  MDM  CTA:  Technically limited examination without evidence of large central pulmonary embolus. The terminal right and left pulmonary arteries, lobar, segmental, and subsegmental pulmonary arteries are not adequately opacified on this examination to definitively exclude pulmonary embolus. Multifocal pulmonary infiltrate, likely infectious or inflammatory in the acute setting. Borderline pathologic adenopathy is likely reactive in nature. No central obstructing lesion.   COVID/flu test pending.  Delta troponin pending.   Patient with positive Covid 19 test  12/01/20 therefore at this time suspect post viral pneumonia- plan for blood cultures, lactic acid, and abx with consult for admission.   07:25: RE-EVAL: Patient states he is ready to be discharged. I advised him that our recommendation is for admission to the hospital due to his oxygen requirement with what we suspect is post viral bacterial pneumonia. He is adamantly refusing. I explained our concerns especially in regards to his vital signs, he is currently 85% on RA. He is alert & oriented. Patient signing out AGAINST MEDICAL ADVICE- The patient and I discussed the nature and purpose, risks and benefits, as well as, the alternatives of treatment. Time was given to allow the opportunity to ask questions and consider options. After the discussion, the patient decided to refuse admission and refuse to remain in the ED while his additional tests are pending and we give him some IV antibiotics. The patient was informed that refusal could lead to, but was not limited to, death, permanent disability, or severe pain. Prior to refusing, I determined that the patient had the capacity  to make their decision and understood the consequences of that decision. After refusal, I made every reasonable effort to treat them to the best of my ability- oral abx prescribed (Doxycycline & Augmentin) & recommended he use his supplemental oxygen at home with very close PCP follow up.  The patient was notified that they may return to the emergency department at any time for further treatment.  Attending made aware.         Cherly Anderson, PA-C 01/19/21 0740    Milagros Loll, MD 01/19/21 1147

## 2021-05-18 ENCOUNTER — Emergency Department (HOSPITAL_COMMUNITY): Payer: No Typology Code available for payment source

## 2021-05-18 ENCOUNTER — Emergency Department (HOSPITAL_COMMUNITY)
Admission: EM | Admit: 2021-05-18 | Discharge: 2021-05-19 | Disposition: A | Discharge status: Home / Self Care | Payer: No Typology Code available for payment source | Attending: Emergency Medicine | Admitting: Emergency Medicine

## 2021-05-18 DIAGNOSIS — Z5321 Procedure and treatment not carried out due to patient leaving prior to being seen by health care provider: Secondary | ICD-10-CM | POA: Diagnosis not present

## 2021-05-18 DIAGNOSIS — R531 Weakness: Secondary | ICD-10-CM | POA: Insufficient documentation

## 2021-05-18 DIAGNOSIS — R2243 Localized swelling, mass and lump, lower limb, bilateral: Secondary | ICD-10-CM | POA: Insufficient documentation

## 2021-05-18 DIAGNOSIS — J449 Chronic obstructive pulmonary disease, unspecified: Secondary | ICD-10-CM | POA: Insufficient documentation

## 2021-05-18 DIAGNOSIS — R0602 Shortness of breath: Secondary | ICD-10-CM | POA: Diagnosis not present

## 2021-05-18 DIAGNOSIS — R140 Abdominal distension (gaseous): Secondary | ICD-10-CM | POA: Diagnosis not present

## 2021-05-18 LAB — URINALYSIS, ROUTINE W REFLEX MICROSCOPIC
Bacteria, UA: NONE SEEN
Bilirubin Urine: NEGATIVE
Glucose, UA: >=500 mg/dL — AB
Hgb urine dipstick: NEGATIVE
Ketones, ur: NEGATIVE mg/dL
Leukocytes,Ua: NEGATIVE
Nitrite: NEGATIVE
Protein, ur: 30 mg/dL — AB
Specific Gravity, Urine: 1.009 (ref 1.005–1.030)
pH: 6 (ref 5.0–8.0)

## 2021-05-18 LAB — HEPATIC FUNCTION PANEL
ALT: 20 U/L (ref 0–44)
AST: 24 U/L (ref 15–41)
Albumin: 3.2 g/dL — ABNORMAL LOW (ref 3.5–5.0)
Alkaline Phosphatase: 65 U/L (ref 38–126)
Bilirubin, Direct: 0.2 mg/dL (ref 0.0–0.2)
Indirect Bilirubin: 0.8 mg/dL (ref 0.3–0.9)
Total Bilirubin: 1 mg/dL (ref 0.3–1.2)
Total Protein: 6.1 g/dL — ABNORMAL LOW (ref 6.5–8.1)

## 2021-05-18 LAB — CBC
HCT: 51.8 % (ref 39.0–52.0)
Hemoglobin: 15.8 g/dL (ref 13.0–17.0)
MCH: 29.5 pg (ref 26.0–34.0)
MCHC: 30.5 g/dL (ref 30.0–36.0)
MCV: 96.8 fL (ref 80.0–100.0)
Platelets: 248 10*3/uL (ref 150–400)
RBC: 5.35 MIL/uL (ref 4.22–5.81)
RDW: 15.5 % (ref 11.5–15.5)
WBC: 4.2 10*3/uL (ref 4.0–10.5)
nRBC: 0 % (ref 0.0–0.2)

## 2021-05-18 LAB — BRAIN NATRIURETIC PEPTIDE: B Natriuretic Peptide: 418.8 pg/mL — ABNORMAL HIGH (ref 0.0–100.0)

## 2021-05-18 LAB — BASIC METABOLIC PANEL
Anion gap: 11 (ref 5–15)
BUN: 7 mg/dL (ref 6–20)
CO2: 39 mmol/L — ABNORMAL HIGH (ref 22–32)
Calcium: 8.3 mg/dL — ABNORMAL LOW (ref 8.9–10.3)
Chloride: 90 mmol/L — ABNORMAL LOW (ref 98–111)
Creatinine, Ser: 1.2 mg/dL (ref 0.61–1.24)
GFR, Estimated: >60 mL/min (ref >60)
Glucose, Bld: 375 mg/dL — ABNORMAL HIGH (ref 70–99)
Potassium: 3.2 mmol/L — ABNORMAL LOW (ref 3.5–5.1)
Sodium: 140 mmol/L (ref 135–145)

## 2021-05-18 LAB — TROPONIN I (HIGH SENSITIVITY): Troponin I (High Sensitivity): 25 ng/L — ABNORMAL HIGH (ref <18)

## 2021-05-18 MED ORDER — ALBUTEROL SULFATE HFA 108 (90 BASE) MCG/ACT IN AERS: 2.0000 | INHALATION_SPRAY | RESPIRATORY_TRACT | Status: DC | PRN

## 2021-05-18 NOTE — ED Provider Notes (Signed)
Emergency Medicine Provider Triage Evaluation Note  Robert [name] , a 60 y.o. male  was evaluated in triage.  Pt complains of shortness of breath and bilateral lower extremity swelling for 10 days.  History of COPD with emphysema.  But at the bedside he states he supposed be on 2 L of more nausea by nasal cannula at home but is not ordered for 2 months.  Hypoxic to 73% on room air in the emergency department, 84% on lobe on room air, patient to liters supplemental O2 by [place] with improvement in sats to 96%.    Review of Systems  Positive: Shortness of breath, weakness, bilateral lower extremity swelling, abdominal distention Negative: Chest pain, palpitation  Physical Exam  BP (!) 146/79   Pulse (!) 107   Temp 98.6 F (37 C) (Oral)   Resp 18   SpO2 100%  Gen:   Awake, no distress   Resp:  Normal effort  MSK:   Moves extremities without difficulty  Other:  Decreased air movement throughout the lung fields bilaterally.  Exam complicated by patient's body habitus and poor participation.  RRR no M/R/G. 3+ pitting edema to lower extremities up to the knee.  Medical Decision Making  Medically screening exam initiated at 9:48 PM.  Appropriate orders placed.  Robert [name] was informed that the remainder of the evaluation will be completed by another provider, this initial triage assessment does not replace that evaluation, and the importance of remaining in the ED until their evaluation is complete.  This chart was dictated using voice recognition software, Dragon. Despite the best efforts of this provider to proofread and correct errors, errors may still occur which can change documentation meaning.    Robert [name] 05/18/21 2150    Tegeler, Canary Brim, MD 05/19/21 [phone]

## 2021-05-18 NOTE — ED Triage Notes (Signed)
Pt c/o circulatory issues & SHOB x1.5wks. Hx neuropathy, COPD w emphysema. Pt's wife reports pt supposed to be on 1.5-2L [place], has not worn for approx 85mos. Swelling noted to ankles, feet, pt reports this is new as well.   Pt 73% SpO2, poor reading on finger. 84% on ear lobe on RA, placed on 2L [place] -> 96%

## 2021-05-19 NOTE — ED Notes (Signed)
Patient left on own accord °

## 2021-05-19 NOTE — ED Notes (Signed)
Pt left AMA. Pt encouraged to stay  

## 2021-06-11 ENCOUNTER — Ambulatory Visit (INDEPENDENT_AMBULATORY_CARE_PROVIDER_SITE_OTHER): Payer: BC Managed Care – PPO | Admitting: Podiatry

## 2021-06-11 ENCOUNTER — Other Ambulatory Visit: Payer: Self-pay

## 2021-06-11 DIAGNOSIS — B351 Tinea unguium: Secondary | ICD-10-CM

## 2021-06-11 DIAGNOSIS — B353 Tinea pedis: Secondary | ICD-10-CM | POA: Diagnosis not present

## 2021-06-11 DIAGNOSIS — M79675 Pain in left toe(s): Secondary | ICD-10-CM | POA: Diagnosis not present

## 2021-06-11 DIAGNOSIS — M79674 Pain in right toe(s): Secondary | ICD-10-CM

## 2021-06-11 MED ORDER — KETOCONAZOLE 2 % EX CREA: 1.0000 "application " | TOPICAL_CREAM | Freq: Every day | CUTANEOUS | 2 refills | Status: DC

## 2021-06-11 MED ORDER — TERBINAFINE HCL 250 MG PO TABS: 250.0000 mg | ORAL_TABLET | Freq: Every day | ORAL | 0 refills | Status: AC

## 2021-06-11 NOTE — Progress Notes (Signed)
  Subjective:  Patient ID: Robert [name], male    DOB: [birth year],  MRN: [id number]  Chief Complaint  Patient presents with   Nail Problem    Thick painful toenails    60 y.o. male presents with the above complaint. History confirmed with patient.  Also has dry scaling itchy skin  Objective:  Physical Exam: warm, good capillary refill, no trophic changes or ulcerative lesions, normal DP and PT pulses, normal sensory exam, and tinea pedis. Left Foot: dystrophic yellowed discolored nail plates with subungual debris Right Foot: dystrophic yellowed discolored nail plates with subungual debris  Assessment:   1. Tinea pedis of both feet   2. Pain due to onychomycosis of toenails of both feet      Plan:  Patient was evaluated and treated and all questions answered.  Discussed the etiology and treatment options for tinea pedis.  Discussed topical and oral treatment.  Recommended topical treatment with 2% ketoconazole cream.  This was sent to the patient's pharmacy.  Also discussed appropriate foot hygiene, use of antifungal spray such as Tinactin in shoes, as well as cleaning foot surfaces such as showers and bathroom floors with bleach.  Discussed the etiology and treatment options for the condition in detail with the patient. Educated patient on the topical and oral treatment options for mycotic nails. Recommended debridement of the nails today. Sharp and mechanical debridement performed of all painful and mycotic nails today. Nails debrided in length and thickness using a nail nipper to level of comfort. Discussed treatment options including appropriate shoe gear. Follow up as needed for painful nails.    Return if symptoms worsen or fail to improve.

## 2021-10-27 ENCOUNTER — Inpatient Hospital Stay (HOSPITAL_COMMUNITY)
Admission: EM | Admit: 2021-10-27 | Discharge: 2021-10-29 | DRG: 193 | Disposition: A | Discharge status: Home / Self Care | Payer: No Typology Code available for payment source | Attending: Internal Medicine | Admitting: Internal Medicine

## 2021-10-27 ENCOUNTER — Encounter (HOSPITAL_COMMUNITY): Payer: Self-pay

## 2021-10-27 ENCOUNTER — Emergency Department (HOSPITAL_COMMUNITY): Payer: No Typology Code available for payment source

## 2021-10-27 ENCOUNTER — Other Ambulatory Visit: Payer: Self-pay

## 2021-10-27 DIAGNOSIS — K219 Gastro-esophageal reflux disease without esophagitis: Secondary | ICD-10-CM | POA: Diagnosis present

## 2021-10-27 DIAGNOSIS — E119 Type 2 diabetes mellitus without complications: Secondary | ICD-10-CM

## 2021-10-27 DIAGNOSIS — J439 Emphysema, unspecified: Secondary | ICD-10-CM | POA: Diagnosis present

## 2021-10-27 DIAGNOSIS — Z87891 Personal history of nicotine dependence: Secondary | ICD-10-CM | POA: Diagnosis not present

## 2021-10-27 DIAGNOSIS — I10 Essential (primary) hypertension: Secondary | ICD-10-CM | POA: Diagnosis present

## 2021-10-27 DIAGNOSIS — Z9981 Dependence on supplemental oxygen: Secondary | ICD-10-CM

## 2021-10-27 DIAGNOSIS — Z7984 Long term (current) use of oral hypoglycemic drugs: Secondary | ICD-10-CM

## 2021-10-27 DIAGNOSIS — J302 Other seasonal allergic rhinitis: Secondary | ICD-10-CM | POA: Diagnosis present

## 2021-10-27 DIAGNOSIS — J9601 Acute respiratory failure with hypoxia: Secondary | ICD-10-CM | POA: Diagnosis present

## 2021-10-27 DIAGNOSIS — D649 Anemia, unspecified: Secondary | ICD-10-CM | POA: Diagnosis present

## 2021-10-27 DIAGNOSIS — Z91040 Latex allergy status: Secondary | ICD-10-CM

## 2021-10-27 DIAGNOSIS — Z7951 Long term (current) use of inhaled steroids: Secondary | ICD-10-CM

## 2021-10-27 DIAGNOSIS — R0600 Dyspnea, unspecified: Principal | ICD-10-CM

## 2021-10-27 DIAGNOSIS — J189 Pneumonia, unspecified organism: Principal | ICD-10-CM | POA: Diagnosis present

## 2021-10-27 DIAGNOSIS — Z8249 Family history of ischemic heart disease and other diseases of the circulatory system: Secondary | ICD-10-CM | POA: Diagnosis not present

## 2021-10-27 DIAGNOSIS — Z888 Allergy status to other drugs, medicaments and biological substances status: Secondary | ICD-10-CM

## 2021-10-27 DIAGNOSIS — E785 Hyperlipidemia, unspecified: Secondary | ICD-10-CM | POA: Diagnosis present

## 2021-10-27 DIAGNOSIS — Z20822 Contact with and (suspected) exposure to covid-19: Secondary | ICD-10-CM | POA: Diagnosis present

## 2021-10-27 DIAGNOSIS — R710 Precipitous drop in hematocrit: Secondary | ICD-10-CM | POA: Diagnosis present

## 2021-10-27 DIAGNOSIS — J441 Chronic obstructive pulmonary disease with (acute) exacerbation: Secondary | ICD-10-CM | POA: Diagnosis present

## 2021-10-27 LAB — CBC WITH DIFFERENTIAL/PLATELET
Abs Immature Granulocytes: 0.02 10*3/uL (ref 0.00–0.07)
Basophils Absolute: 0 10*3/uL (ref 0.0–0.1)
Basophils Relative: 0 %
Eosinophils Absolute: 0.2 10*3/uL (ref 0.0–0.5)
Eosinophils Relative: 2 %
HCT: 35.6 % — ABNORMAL LOW (ref 39.0–52.0)
Hemoglobin: 11.9 g/dL — ABNORMAL LOW (ref 13.0–17.0)
Immature Granulocytes: 0 %
Lymphocytes Relative: 35 %
Lymphs Abs: 2.8 10*3/uL (ref 0.7–4.0)
MCH: 30.7 pg (ref 26.0–34.0)
MCHC: 33.4 g/dL (ref 30.0–36.0)
MCV: 92 fL (ref 80.0–100.0)
Monocytes Absolute: 0.6 10*3/uL (ref 0.1–1.0)
Monocytes Relative: 7 %
Neutro Abs: 4.4 10*3/uL (ref 1.7–7.7)
Neutrophils Relative %: 56 %
Platelets: UNDETERMINED 10*3/uL (ref 150–400)
RBC: 3.87 MIL/uL — ABNORMAL LOW (ref 4.22–5.81)
RDW: 14 % (ref 11.5–15.5)
WBC: 8 10*3/uL (ref 4.0–10.5)
nRBC: 0 % (ref 0.0–0.2)

## 2021-10-27 LAB — PROTIME-INR
INR: 1.1 (ref 0.8–1.2)
Prothrombin Time: 14 seconds (ref 11.4–15.2)

## 2021-10-27 LAB — TROPONIN I (HIGH SENSITIVITY)
Troponin I (High Sensitivity): 14 ng/L (ref <18)
Troponin I (High Sensitivity): 23 ng/L — ABNORMAL HIGH (ref <18)

## 2021-10-27 LAB — COMPREHENSIVE METABOLIC PANEL
ALT: 14 U/L (ref 0–44)
AST: 23 U/L (ref 15–41)
Albumin: 4.3 g/dL (ref 3.5–5.0)
Alkaline Phosphatase: 60 U/L (ref 38–126)
Anion gap: 8 (ref 5–15)
BUN: 13 mg/dL (ref 8–23)
CO2: 30 mmol/L (ref 22–32)
Calcium: 9.1 mg/dL (ref 8.9–10.3)
Chloride: 103 mmol/L (ref 98–111)
Creatinine, Ser: 0.75 mg/dL (ref 0.61–1.24)
GFR, Estimated: >60 mL/min (ref >60)
Glucose, Bld: 172 mg/dL — ABNORMAL HIGH (ref 70–99)
Potassium: 3.5 mmol/L (ref 3.5–5.1)
Sodium: 141 mmol/L (ref 135–145)
Total Bilirubin: 0.8 mg/dL (ref 0.3–1.2)
Total Protein: 7.8 g/dL (ref 6.5–8.1)

## 2021-10-27 LAB — I-STAT CHEM 8, ED
BUN: 15 mg/dL (ref 8–23)
Calcium, Ion: 0.89 mmol/L — CL (ref 1.15–1.40)
Chloride: 111 mmol/L (ref 98–111)
Creatinine, Ser: 0.5 mg/dL — ABNORMAL LOW (ref 0.61–1.24)
Glucose, Bld: 139 mg/dL — ABNORMAL HIGH (ref 70–99)
HCT: 29 % — ABNORMAL LOW (ref 39.0–52.0)
Hemoglobin: 9.9 g/dL — ABNORMAL LOW (ref 13.0–17.0)
Potassium: 6.5 mmol/L (ref 3.5–5.1)
Sodium: 139 mmol/L (ref 135–145)
TCO2: 27 mmol/L (ref 22–32)

## 2021-10-27 LAB — I-STAT VENOUS BLOOD GAS, ED
Acid-Base Excess: 2 mmol/L (ref 0.0–2.0)
Bicarbonate: 28.6 mmol/L — ABNORMAL HIGH (ref 20.0–28.0)
Calcium, Ion: 0.92 mmol/L — ABNORMAL LOW (ref 1.15–1.40)
HCT: 30 % — ABNORMAL LOW (ref 39.0–52.0)
Hemoglobin: 10.2 g/dL — ABNORMAL LOW (ref 13.0–17.0)
O2 Saturation: 94 %
Potassium: 6.5 mmol/L (ref 3.5–5.1)
Sodium: 139 mmol/L (ref 135–145)
TCO2: 30 mmol/L (ref 22–32)
pCO2, Ven: 55.3 mmHg (ref 44–60)
pH, Ven: 7.322 (ref 7.25–7.43)
pO2, Ven: 79 mmHg — ABNORMAL HIGH (ref 32–45)

## 2021-10-27 LAB — RETICULOCYTES
Immature Retic Fract: 21.7 % — ABNORMAL HIGH (ref 2.3–15.9)
RBC.: 3.96 MIL/uL — ABNORMAL LOW (ref 4.22–5.81)
Retic Count, Absolute: 76.4 10*3/uL (ref 19.0–186.0)
Retic Ct Pct: 1.9 % (ref 0.4–3.1)

## 2021-10-27 LAB — CBG MONITORING, ED: Glucose-Capillary: 182 mg/dL — ABNORMAL HIGH (ref 70–99)

## 2021-10-27 LAB — RESP PANEL BY RT-PCR (FLU A&B, COVID) ARPGX2
Influenza A by PCR: NEGATIVE
Influenza B by PCR: NEGATIVE
SARS Coronavirus 2 by RT PCR: NEGATIVE

## 2021-10-27 LAB — LACTIC ACID, PLASMA
Lactic Acid, Venous: 2.2 mmol/L (ref 0.5–1.9)
Lactic Acid, Venous: 4.6 mmol/L (ref 0.5–1.9)

## 2021-10-27 LAB — BRAIN NATRIURETIC PEPTIDE: B Natriuretic Peptide: 197 pg/mL — ABNORMAL HIGH (ref 0.0–100.0)

## 2021-10-27 MED ORDER — IPRATROPIUM-ALBUTEROL 0.5-2.5 (3) MG/3ML IN SOLN
RESPIRATORY_TRACT | Status: AC
  Filled 2021-10-27: qty 3

## 2021-10-27 MED ORDER — SODIUM CHLORIDE 0.9 % IV SOLN
1.0000 g | INTRAVENOUS | Status: DC
  Administered 2021-10-28: 1 g via INTRAVENOUS
  Filled 2021-10-27: qty 10

## 2021-10-27 MED ORDER — ACETAMINOPHEN 325 MG PO TABS
650.0000 mg | ORAL_TABLET | Freq: Four times a day (QID) | ORAL | Status: DC | PRN
  Administered 2021-10-28: 650 mg via ORAL
  Filled 2021-10-27: qty 2

## 2021-10-27 MED ORDER — LORAZEPAM 2 MG/ML IJ SOLN
INTRAMUSCULAR | Status: AC
  Administered 2021-10-27: 0.5 mg via INTRAVENOUS
  Filled 2021-10-27: qty 1

## 2021-10-27 MED ORDER — SODIUM CHLORIDE 0.9 % IV SOLN
500.0000 mg | Freq: Once | INTRAVENOUS | Status: AC
  Administered 2021-10-27: 500 mg via INTRAVENOUS
  Filled 2021-10-27: qty 5

## 2021-10-27 MED ORDER — SODIUM CHLORIDE 0.9 % IV SOLN
1.0000 g | Freq: Once | INTRAVENOUS | Status: AC
  Administered 2021-10-27: 1 g via INTRAVENOUS
  Filled 2021-10-27: qty 10

## 2021-10-27 MED ORDER — SODIUM CHLORIDE 0.9 % IV SOLN
500.0000 mg | INTRAVENOUS | Status: DC
  Administered 2021-10-28: 500 mg via INTRAVENOUS
  Filled 2021-10-27 (×2): qty 5

## 2021-10-27 MED ORDER — IPRATROPIUM-ALBUTEROL 0.5-2.5 (3) MG/3ML IN SOLN
3.0000 mL | Freq: Four times a day (QID) | RESPIRATORY_TRACT | Status: DC
  Administered 2021-10-27 – 2021-10-29 (×6): 3 mL via RESPIRATORY_TRACT
  Filled 2021-10-27 (×7): qty 3

## 2021-10-27 MED ORDER — SODIUM CHLORIDE 0.9 % IV BOLUS
500.0000 mL | Freq: Once | INTRAVENOUS | Status: AC
  Administered 2021-10-27: 500 mL via INTRAVENOUS

## 2021-10-27 MED ORDER — INSULIN ASPART 100 UNIT/ML IJ SOLN
0.0000 [IU] | INTRAMUSCULAR | Status: DC
  Administered 2021-10-27: 2 [IU] via SUBCUTANEOUS
  Administered 2021-10-28 (×2): 5 [IU] via SUBCUTANEOUS
  Administered 2021-10-28 (×2): 2 [IU] via SUBCUTANEOUS
  Administered 2021-10-29 (×2): 1 [IU] via SUBCUTANEOUS
  Administered 2021-10-29: 3 [IU] via SUBCUTANEOUS

## 2021-10-27 MED ORDER — ACETAMINOPHEN 650 MG RE SUPP: 650.0000 mg | Freq: Four times a day (QID) | RECTAL | Status: DC | PRN

## 2021-10-27 MED ORDER — ALBUTEROL SULFATE (2.5 MG/3ML) 0.083% IN NEBU
2.5000 mg | INHALATION_SOLUTION | RESPIRATORY_TRACT | Status: DC | PRN
Start: 2021-10-27 — End: 2021-10-29

## 2021-10-27 MED ORDER — INSULIN ASPART 100 UNIT/ML IJ SOLN: 0.0000 [IU] | Freq: Every day | INTRAMUSCULAR | Status: DC

## 2021-10-27 MED ORDER — IPRATROPIUM-ALBUTEROL 0.5-2.5 (3) MG/3ML IN SOLN
3.0000 mL | Freq: Once | RESPIRATORY_TRACT | Status: AC
  Administered 2021-10-27: 3 mL via RESPIRATORY_TRACT

## 2021-10-27 MED ORDER — INSULIN ASPART 100 UNIT/ML IJ SOLN: 0.0000 [IU] | Freq: Three times a day (TID) | INTRAMUSCULAR | Status: DC

## 2021-10-27 MED ORDER — LORAZEPAM 2 MG/ML IJ SOLN: 0.5000 mg | Freq: Once | INTRAMUSCULAR | Status: AC

## 2021-10-27 MED ORDER — METHYLPREDNISOLONE SODIUM SUCC 125 MG IJ SOLR
80.0000 mg | Freq: Two times a day (BID) | INTRAMUSCULAR | Status: DC
  Administered 2021-10-28 – 2021-10-29 (×3): 80 mg via INTRAVENOUS
  Filled 2021-10-27 (×3): qty 2

## 2021-10-27 MED ORDER — BUDESONIDE 0.25 MG/2ML IN SUSP
0.2500 mg | Freq: Two times a day (BID) | RESPIRATORY_TRACT | Status: DC
  Administered 2021-10-28 – 2021-10-29 (×3): 0.25 mg via RESPIRATORY_TRACT
  Filled 2021-10-27 (×4): qty 2

## 2021-10-27 NOTE — Assessment & Plan Note (Signed)
-  Continue PPI after pharmacy med rec is done. ?

## 2021-10-27 NOTE — H&P (Signed)
?History and Physical  ? ? ?Robert [name] ZOX:[id number] DOB: 01-11-1961 DOA: 10/27/2021 ? ?PCP: Coralyn HellingSood, Vine[place], MD ? ?Patient coming from: Home ? ?Chief Complaint: Shortness of breath ? ?HPI: Robert [name] is a 61 y.o. 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  EMS reported oxygen saturation 80% at home and patient was placed on CPAP.  He was given DuoNeb x2, Solu-Medrol 125 mg, and IV magnesium 2 g.  Upon arrival to the ED, patient placed on BiPAP due to increasing shortness of breath.  Not febrile.  Labs showing no leukocytosis.  Hemoglobin 11.9, MCV 92.0.  Platelet count could not be estimated due to clumps on smear.  Blood glucose 172.  COVID and influenza PCR pending.  High-sensitivity troponin negative.  BNP pending.  Lactic acid 2.2.  Blood gas without evidence of acidosis or hypercapnia.  Blood cultures pending.  Chest x-ray showing developing infiltration in the lung bases. Patient was given DuoNeb, Ativan, ceftriaxone, azithromycin, and 500 cc normal saline bolus. ? ?History patient is currently on BiPAP.  Reports progressively worsening dyspnea for the past few days.  Tonight he could barely breathe before coming into the emergency room but now feels much better after receiving treatments and being placed on BiPAP.  Denies cough or chest pain.  Reports using multiple inhalers at home but does not remember the names.  No other complaints. ? ?Review of Systems:  ?Review of Systems  ?All other systems reviewed and are negative. ? ?Past Medical History:  ?Diagnosis Date  ? ACE-inhibitor cough   ? Acute respiratory failure [facility](HCC) January 2015  ? 2nd to AECOPD, required intubation  ? COPD with emphysema (HCC)   ?    ? Hiatal hernia   ? Hyperlipidemia   ? Hypertension   ? Nephrolithiasis   ? Nocturnal hypoxemia due to emphysema [facility](HCC)   ?  Pulmonary nodule, right 12/10/2010  ? Seasonal allergies   ? ? ?Past Surgical History:  ?Procedure Laterality Date  ? KNEE ARTHROSCOPY Right 2005  ? x 2 -had one in the service.  ? ? ? reports that he quit smoking about 30 years ago. His smoking use included cigarettes. He has a 24.00 pack-year smoking history. He has never used smokeless tobacco. He reports that he does not drink alcohol and does not use drugs. ? ?Allergies  ?Allergen Reactions  ? Ace Inhibitors Cough  ? Aspirin   ?  REACTION: intolerance  ? Latex Other (See Comments)  ?  unknown  ? ? ?Family History  ?Problem Relation Age of Onset  ? Pancreatic cancer Father   ? Heart disease Sister   ? ? ?Prior to Admission medications   ?Medication Sig Start Date End Date Taking? Authorizing Provider  ?albuterol (VENTOLIN HFA) 108 (90 Base) MCG/ACT inhaler Inhale 2 puffs into the lungs every 6 (six) hours as needed for wheezing or shortness of breath.    [provider]  ?amLODipine (NORVASC) 5 MG tablet Take 5 mg by mouth daily.     [provider]  ?amoxicillin-clavulanate (AUGMENTIN) 875-125 MG tablet Take 1 tablet by mouth every 12 (twelve) hours. 01/19/21   Petrucelli, Samantha R, PA-C  ?atorvastatin (LIPITOR) 40 MG tablet Take 40 mg by mouth at bedtime.     [provider]  ?budesonide-formoterol (SYMBICORT) 160-4.5 MCG/ACT inhaler Inhale 2 puffs into the lungs 2 (two) times daily.  [provider]  ?cetirizine (ZYRTEC) 10 MG tablet Take 10 mg by mouth daily.      [provider]  ?Cholecalciferol (VITAMIN D3) 2000 UNITS TABS Take 2,000 Units by mouth daily.    [provider]  ?doxycycline (VIBRAMYCIN) 100 MG capsule Take 1 capsule (100 mg total) by mouth 2 (two) times daily. 01/19/21   Petrucelli, Samantha R, PA-C  ?flunisolide (NASALIDE) 25 MCG/ACT (0.025%) SOLN Place 1 spray into the nose 2 (two) times daily as needed (allergies).    [provider]  ?fluticasone-salmeterol (ADVAIR) 250-50  MCG/ACT AEPB Inhale 1 puff into the lungs in the morning and at bedtime. 11/13/20   [provider]  ?glipiZIDE (GLUCOTROL) 5 MG tablet Take 1 tablet by mouth 2 (two) times daily before a meal. 10/28/15   [provider]  ?hydrochlorothiazide (HYDRODIURIL) 25 MG tablet Take 1 tablet by mouth daily. 04/03/15   [provider]  ?HYDROcodone-acetaminophen (NORCO/VICODIN) 5-325 MG per tablet Take 1 tablet by mouth 3 (three) times daily as needed for moderate pain.    [provider]  ?ketoconazole (NIZORAL) 2 % cream Apply 1 application topically daily. 06/11/21   Edwin Cap, DPM  ?losartan (COZAAR) 100 MG tablet Take 100 mg by mouth daily.      [provider]  ?Omega-3 Fatty Acids (FISH OIL) 1000 MG CAPS Take 1,000 mg by mouth 2 (two) times daily.    [provider]  ?pantoprazole (PROTONIX) 40 MG tablet Take 40 mg by mouth 2 (two) times daily.    [provider]  ?pioglitazone (ACTOS) 30 MG tablet Take 30 mg by mouth daily. 12/22/20   [provider]  ?tiotropium (SPIRIVA) 18 MCG inhalation capsule Place 1 capsule (18 mcg total) into inhaler and inhale daily. ?Patient not taking: No sig reported 11/16/10   [name], MD  ?urea (CARMOL) 20 % cream Apply 1 application topically daily as needed (rash).    [provider]  ? ? ?Physical Exam: ?Vitals:  ? 10/27/21 1906 10/27/21 1915 10/27/21 1954  ?BP: (!) 179/122  (!) 151/88  ?Pulse: (!) 120  69  ?Resp: (!) 22  (!) 28  ?Temp: 98 ?F (36.7 ?C)    ?TempSrc: Axillary    ?SpO2: 100% 100% 100%  ? ? ?Physical Exam ?Vitals reviewed.  ?Constitutional:   ?   General: He is not in acute distress. ?HENT:  ?   Head: Normocephalic and atraumatic.  ?Cardiovascular:  ?   Rate and Rhythm: Normal rate and regular rhythm.  ?   Pulses: Normal pulses.  ?Pulmonary:  ?   Effort: Pulmonary effort is normal. No respiratory distress.  ?   Breath sounds: No wheezing.  ?Abdominal:  ?   General: Bowel sounds are  normal.  ?   Palpations: Abdomen is soft.  ?   Tenderness: There is no abdominal tenderness. There is no guarding.  ?Musculoskeletal:  ?   Cervical back: Normal range of motion.  ?   Comments: Trace bilateral lower extremity edema  ?Skin: ?   General: Skin is warm and dry.  ?Neurological:  ?   General: No focal deficit present.  ?   Mental Status: He is alert and oriented to person, place, and time.  ?  ? ?Labs on Admission: I have personally reviewed following labs and imaging studies ? ?CBC: ?Recent Labs  ?Lab 10/27/21 ?1912 10/27/21 ?1921  ?WBC 8.0  --   ?NEUTROABS 4.4  --   ?HGB 11.9* 10.2*  9.9*  ?HCT 35.6* 30.0*  29.0*  ?MCV 92.0  --   ?PLT PLATELET CLUMPS NOTED ON SMEAR, UNABLE TO ESTIMATE  --   ? ?Basic Metabolic Panel: ?Recent Labs  ?Lab 10/27/21 ?1912 10/27/21 ?1921  ?NA 141 139  139  ?K 3.5 6.5*  6.5*  ?CL 103 111  ?CO2 30  --   ?GLUCOSE 172* 139*  ?BUN 13 15  ?CREATININE 0.75 0.50*  ?CALCIUM 9.1  --   ? ?GFR: ?CrCl cannot be calculated (Unknown ideal weight.). ?Liver Function Tests: ?Recent Labs  ?Lab 10/27/21 ?1912  ?AST 23  ?ALT 14  ?ALKPHOS 60  ?BILITOT 0.8  ?PROT 7.8  ?ALBUMIN 4.3  ? ?No results for input(s): LIPASE, AMYLASE in the last 168 hours. ?No results for input(s): AMMONIA in the last 168 hours. ?Coagulation Profile: ?Recent Labs  ?Lab 10/27/21 ?1912  ?INR 1.1  ? ?Cardiac Enzymes: ?No results for input(s): CKTOTAL, CKMB, CKMBINDEX, TROPONINI in the last 168 hours. ?BNP (last 3 results) ?No results for input(s): PROBNP in the last 8760 hours. ?HbA1C: ?No results for input(s): HGBA1C in the last 72 hours. ?CBG: ?No results for input(s): GLUCAP in the last 168 hours. ?Lipid Profile: ?No results for input(s): CHOL, HDL, LDLCALC, TRIG, CHOLHDL, LDLDIRECT in the last 72 hours. ?Thyroid Function Tests: ?No results for input(s): TSH, T4TOTAL, FREET4, T3FREE, THYROIDAB in the last 72 hours. ?Anemia Panel: ?No results for input(s): VITAMINB12, FOLATE, FERRITIN, TIBC, IRON, RETICCTPCT in the last 72  hours. ?Urine analysis: ?   ?Component Value Date/Time  ? COLORURINE YELLOW 05/18/2021 2138  ? APPEARANCEUR CLEAR 05/18/2021 2138  ? LABSPEC 1.009 05/18/2021 2138  ? PHURINE 6.0 05/18/2021 2138  ? GLUCOSEU >=50

## 2021-10-27 NOTE — Progress Notes (Signed)
RT removed patient from BIPAP at this time. No respiratory distress noted. RT placed pt on 3L [place] and placed call bell within reach of patient. RT notified RN. BIPAP is at bedside on standby. RT will monitor as needed. ?

## 2021-10-27 NOTE — ED Triage Notes (Addendum)
Patient arrived by [facility] with complaint of SOB x 1day. Patient arrived on c-pap and received duoneb x 2, solumedrol 125 and mag 2grams pta. Patient alert and oriented, denies pain. Denies illness and fever. Patient removed from c-pap on arrival and speaking short sentences. Patient uses oxygen at home 2l. Patient due to increasing SOB will be placed back on bipap. ?EMS reports that sats at home 80%, no breath sounds.  ?

## 2021-10-27 NOTE — Assessment & Plan Note (Signed)
Hemoglobin currently 11.9, was above 15 on previous labs.  Patient is not endorsing any symptoms of GI bleed.  No prior colonoscopy results in the chart. ?-FOBT ?-Anemia panel ?

## 2021-10-27 NOTE — Assessment & Plan Note (Addendum)
-  Check A1c ?-Hold home oral hypoglycemic agents ?-Sliding scale insulin sensitive every 4 hours as patient is currently n.p.o./on BiPAP ?

## 2021-10-27 NOTE — Assessment & Plan Note (Signed)
-  Continue statin after pharmacy med rec is done. °

## 2021-10-27 NOTE — Assessment & Plan Note (Signed)
Slightly hypertensive. ?-Continue home medications after pharmacy med rec is done. ?

## 2021-10-27 NOTE — ED Provider Notes (Addendum)
[facility] EMERGENCY DEPARTMENT Provider Note   CSN: [id number] Arrival date & time: 10/27/21  1859     History  No chief complaint on file.   Robert [name] is a 61 y.o. male.  61 year old male with prior medical history as detailed below presents for evaluation.  Patient reports onset of shortness of breath over the course of today.  Patient with longstanding history of COPD.  Patient is chronically O2 dependent and typically wears 3 to 4 L nasal cannula.  EMS reports significant respiratory distress upon their initial evaluation.  Patient was given DuoNebs, Solu-Medrol, and magnesium prior to arrival.  CPAP was initiated in the field.  Upon arrival to the ED patient is mildly improved.  He continues to have increased work of breathing.  He denies associated chest pain.  He denies recent fever.  Patient with prior history of intubation secondary to COPD exacerbation.  The history is provided by the patient, medical records and a relative.      Home Medications Prior to Admission medications   Medication Sig Start Date End Date Taking? Authorizing Provider  albuterol (VENTOLIN HFA) 108 (90 Base) MCG/ACT inhaler Inhale 2 puffs into the lungs every 6 (six) hours as needed for wheezing or shortness of breath.    [provider]  amLODipine (NORVASC) 5 MG tablet Take 5 mg by mouth daily.     [provider]  amoxicillin-clavulanate (AUGMENTIN) 875-125 MG tablet Take 1 tablet by mouth every 12 (twelve) hours. 01/19/21   Petrucelli, Samantha R, PA-C  atorvastatin (LIPITOR) 40 MG tablet Take 40 mg by mouth at bedtime.     [provider]  budesonide-formoterol (SYMBICORT) 160-4.5 MCG/ACT inhaler Inhale 2 puffs into the lungs 2 (two) times daily.    [provider]  cetirizine (ZYRTEC) 10 MG tablet Take 10 mg by mouth daily.      [provider]  Cholecalciferol (VITAMIN D3) 2000 UNITS TABS Take 2,000 Units by mouth daily.     [provider]  doxycycline (VIBRAMYCIN) 100 MG capsule Take 1 capsule (100 mg total) by mouth 2 (two) times daily. 01/19/21   Petrucelli, Samantha R, PA-C  flunisolide (NASALIDE) 25 MCG/ACT (0.025%) SOLN Place 1 spray into the nose 2 (two) times daily as needed (allergies).    [provider]  fluticasone-salmeterol (ADVAIR) 250-50 MCG/ACT AEPB Inhale 1 puff into the lungs in the morning and at bedtime. 11/13/20   [provider]  glipiZIDE (GLUCOTROL) 5 MG tablet Take 1 tablet by mouth 2 (two) times daily before a meal. 10/28/15   [provider]  hydrochlorothiazide (HYDRODIURIL) 25 MG tablet Take 1 tablet by mouth daily. 04/03/15   [provider]  HYDROcodone-acetaminophen (NORCO/VICODIN) 5-325 MG per tablet Take 1 tablet by mouth 3 (three) times daily as needed for moderate pain.    [provider]  ketoconazole (NIZORAL) 2 % cream Apply 1 application topically daily. 06/11/21   McDonald, Rachelle Hora, DPM  losartan (COZAAR) 100 MG tablet Take 100 mg by mouth daily.      [provider]  Omega-3 Fatty Acids (FISH OIL) 1000 MG CAPS Take 1,000 mg by mouth 2 (two) times daily.    [provider]  pantoprazole (PROTONIX) 40 MG tablet Take 40 mg by mouth 2 (two) times daily.    [provider]  pioglitazone (ACTOS) 30 MG tablet Take 30 mg by mouth daily. 12/22/20   [provider]  tiotropium (SPIRIVA) 18 MCG inhalation capsule Place  1 capsule (18 mcg total) into inhaler and inhale daily. Patient not taking: No sig reported 11/16/10   [name], MD  urea (CARMOL) 20 % cream Apply 1 application topically daily as needed (rash).    [provider]      Allergies    Ace inhibitors, Aspirin, and Latex    Review of Systems   Review of Systems  All other systems reviewed and are negative.  Physical Exam Updated Vital Signs BP (!) 151/88   Pulse 69   Temp 98 F (36.7 C) (Axillary)   Resp (!) 28   SpO2  100%  Physical Exam Vitals and nursing note reviewed.  Constitutional:      General: He is not in acute distress.    Appearance: Normal appearance. He is well-developed. He is ill-appearing.     Comments: Moderate respiratory distress.  CPAP in progress  HENT:     Head: Normocephalic and atraumatic.  Eyes:     Conjunctiva/sclera: Conjunctivae normal.     Pupils: Pupils are equal, round, and reactive to light.  Cardiovascular:     Rate and Rhythm: Regular rhythm. Tachycardia present.     Heart sounds: Normal heart sounds.  Pulmonary:     Effort: No respiratory distress.     Comments: Decreased breath sounds in all lung fields, significant increased work of breathing Abdominal:     General: There is no distension.     Palpations: Abdomen is soft.     Tenderness: There is no abdominal tenderness.  Musculoskeletal:        General: No deformity. Normal range of motion.     Cervical back: Normal range of motion and neck supple.  Skin:    General: Skin is warm and dry.  Neurological:     General: No focal deficit present.     Mental Status: He is alert and oriented to person, place, and time.    ED Results / Procedures / Treatments   Labs (all labs ordered are listed, but only abnormal results are displayed) Labs Reviewed  CBC WITH DIFFERENTIAL/PLATELET - Abnormal; Notable for the following components:      Result Value   RBC 3.87 (*)    Hemoglobin 11.9 (*)    HCT 35.6 (*)    All other components within normal limits  COMPREHENSIVE METABOLIC PANEL - Abnormal; Notable for the following components:   Glucose, Bld 172 (*)    All other components within normal limits  LACTIC ACID, PLASMA - Abnormal; Notable for the following components:   Lactic Acid, Venous 2.2 (*)    All other components within normal limits  I-STAT CHEM 8, ED - Abnormal; Notable for the following components:   Potassium 6.5 (*)    Creatinine, Ser 0.50 (*)    Glucose, Bld 139 (*)    Calcium, Ion 0.89 (*)     Hemoglobin 9.9 (*)    HCT 29.0 (*)    All other components within normal limits  I-STAT VENOUS BLOOD GAS, ED - Abnormal; Notable for the following components:   pO2, Ven 79 (*)    Bicarbonate 28.6 (*)    Potassium 6.5 (*)    Calcium, Ion 0.92 (*)    HCT 30.0 (*)    Hemoglobin 10.2 (*)    All other components within normal limits  RESP PANEL BY RT-PCR (FLU A&B, COVID) ARPGX2  CULTURE, BLOOD (ROUTINE X 2)  CULTURE, BLOOD (ROUTINE X 2)  PROTIME-INR  BRAIN NATRIURETIC PEPTIDE  URINALYSIS, ROUTINE W  REFLEX MICROSCOPIC  RAPID URINE DRUG SCREEN, HOSP PERFORMED  LACTIC ACID, PLASMA  TROPONIN I (HIGH SENSITIVITY)    EKG None  Radiology DG Chest Port 1 View  Result Date: 10/27/2021 CLINICAL DATA:  Shortness of breath for 1 day EXAM: PORTABLE CHEST 1 VIEW COMPARISON:  05/18/2021 FINDINGS: Heart size and pulmonary vascularity are normal. Emphysematous changes in the upper lungs. Suggestion of developing infiltrations in the lung bases, possibly multifocal pneumonia. No pleural effusions. No pneumothorax. Mediastinal contours appear intact. Old rib fractures. IMPRESSION: Emphysematous changes in the lungs. Suggestion of developing infiltration in the lung bases. Electronically Signed   By: Burman Nieves M.D.   On: 10/27/2021 19:56    Procedures Procedures    Medications Ordered in ED Medications  cefTRIAXone (ROCEPHIN) 1 g in sodium chloride 0.9 % 100 mL IVPB (has no administration in time range)  azithromycin (ZITHROMAX) 500 mg in sodium chloride 0.9 % 250 mL IVPB (has no administration in time range)  sodium chloride 0.9 % bolus 500 mL (has no administration in time range)  ipratropium-albuterol (DUONEB) 0.5-2.5 (3) MG/3ML nebulizer solution 3 mL (3 mLs Nebulization Not Given 10/27/21 1923)  LORazepam (ATIVAN) injection 0.5 mg (0.5 mg Intravenous Given 10/27/21 1917)    ED Course/ Medical Decision Making/ A&P                           Medical Decision Making Amount and/or  Complexity of Data Reviewed Labs: ordered. Radiology: ordered.  Risk Prescription drug management. Decision regarding hospitalization.    Medical Screen Complete  This patient presented to the ED with complaint of respiratory distress.  This complaint involves an extensive number of treatment options. The initial differential diagnosis includes, but is not limited to, COPD exacerbation, pneumonia, metabolic abnormality, etc.  This presentation is: Acute, Chronic, Self-Limited, Previously Undiagnosed, Uncertain Prognosis, Complicated, Systemic Symptoms, and Threat to Life/Bodily Function  Patient is presenting in respiratory distress.  Patient with longstanding history of COPD.  Patient given albuterol, steroids, and magnesium prior to arrival  CPAP initiated by EMS.  Patient placed on BiPAP.  With BiPAP patient is improved.  Screening labs obtained.  Chest x-ray is suggestive of possible early pneumonia.  Cultures and antibiotics initiated in ED.  Patient will require admission for further work-up and treatment.  Co morbidities that complicated the patient's evaluation  Advanced COPD   Additional history obtained:  Additional history obtained from EMS and Spouse External records from outside sources obtained and reviewed including prior ED visits and prior Inpatient records.    Lab Tests:  I ordered and personally interpreted labs.  The pertinent results include: CBC, i-STAT Chem-8, i-STAT VBG, CMP, troponin   Imaging Studies ordered:  I ordered imaging studies including chest x-ray I independently visualized and interpreted obtained imaging which showed possible bilateral lower lobe infiltrate I agree with the radiologist interpretation.   Cardiac Monitoring:  The patient was maintained on a cardiac monitor.  I personally viewed and interpreted the cardiac monitor which showed an underlying rhythm of: Sinus tach   Medicines ordered:  I ordered medication  including Ativan, antibiotics for anxiety, infection Reevaluation of the patient after these medicines showed that the patient: improved   Problem List / ED Course:  Respiratory Distress   Reevaluation:  After the interventions noted above, I reevaluated the patient and found that they have: improved  Disposition:  After consideration of the diagnostic results and the patients response to treatment, I feel  that the patent would benefit from admission.    CRITICAL CARE Performed by: Wynetta Fines   Total critical care time: 45 minutes  Critical care time was exclusive of separately billable procedures and treating other patients.  Critical care was necessary to treat or prevent imminent or life-threatening deterioration.  Critical care was time spent personally by me on the following activities: development of treatment plan with patient and/or surrogate as well as nursing, discussions with consultants, evaluation of patient's response to treatment, examination of patient, obtaining history from patient or surrogate, ordering and performing treatments and interventions, ordering and review of laboratory studies, ordering and review of radiographic studies, pulse oximetry and re-evaluation of patient's condition.         Final Clinical Impression(s) / ED Diagnoses Final diagnoses:  Dyspnea, unspecified type    Rx / DC Orders ED Discharge Orders     None         Wynetta Fines, MD 10/27/21 2120    Wynetta Fines, MD 10/27/21 2120

## 2021-10-27 NOTE — Assessment & Plan Note (Addendum)
CAP ?Acute hypoxemic respiratory failure ?EMS reported oxygen saturation 80% at home.  Work of breathing improved after bronchodilator treatments, Solu-Medrol, and IV magnesium.  Currently comfortable on BiPAP and satting well. Blood gas without evidence of acidosis or hypercapnia. Chest x-ray showing developing infiltration in the lung bases.  Has borderline lactic acidosis but not febrile and does not have leukocytosis.  COVID and influenza PCR negative. ?-Continue ceftriaxone and azithromycin at this time ?-Check procalcitonin level ?-Trend lactate ?-Blood cultures pending ?-MRSA PCR screen ?-Strep pneumo and Legionella urinary antigens ?-Solu-Medrol 80 mg every 12 hours ?-DuoNeb every 6 hours ?-Pulmicort neb twice daily ?-Albuterol nebs every 2 hours as needed ?-Continue BiPAP, wean as tolerated ?-PE less likely, check D-dimer level ?

## 2021-10-28 ENCOUNTER — Inpatient Hospital Stay (HOSPITAL_COMMUNITY): Payer: No Typology Code available for payment source

## 2021-10-28 LAB — RAPID URINE DRUG SCREEN, HOSP PERFORMED
Amphetamines: NOT DETECTED
Barbiturates: NOT DETECTED
Benzodiazepines: NOT DETECTED
Cocaine: NOT DETECTED
Opiates: NOT DETECTED
Tetrahydrocannabinol: NOT DETECTED

## 2021-10-28 LAB — HIV ANTIBODY (ROUTINE TESTING W REFLEX): HIV Screen 4th Generation wRfx: NONREACTIVE

## 2021-10-28 LAB — CBC
HCT: 38.3 % — ABNORMAL LOW (ref 39.0–52.0)
Hemoglobin: 12.7 g/dL — ABNORMAL LOW (ref 13.0–17.0)
MCH: 30.2 pg (ref 26.0–34.0)
MCHC: 33.2 g/dL (ref 30.0–36.0)
MCV: 91.2 fL (ref 80.0–100.0)
Platelets: 257 10*3/uL (ref 150–400)
RBC: 4.2 MIL/uL — ABNORMAL LOW (ref 4.22–5.81)
RDW: 13.9 % (ref 11.5–15.5)
WBC: 7.1 10*3/uL (ref 4.0–10.5)
nRBC: 0 % (ref 0.0–0.2)

## 2021-10-28 LAB — BASIC METABOLIC PANEL
Anion gap: 8 (ref 5–15)
BUN: 12 mg/dL (ref 8–23)
CO2: 30 mmol/L (ref 22–32)
Calcium: 8.9 mg/dL (ref 8.9–10.3)
Chloride: 101 mmol/L (ref 98–111)
Creatinine, Ser: 0.8 mg/dL (ref 0.61–1.24)
GFR, Estimated: >60 mL/min (ref >60)
Glucose, Bld: 214 mg/dL — ABNORMAL HIGH (ref 70–99)
Potassium: 4.4 mmol/L (ref 3.5–5.1)
Sodium: 139 mmol/L (ref 135–145)

## 2021-10-28 LAB — IRON AND TIBC
Iron: 34 ug/dL — ABNORMAL LOW (ref 45–182)
Saturation Ratios: 11 % — ABNORMAL LOW (ref 17.9–39.5)
TIBC: 305 ug/dL (ref 250–450)
UIBC: 271 ug/dL

## 2021-10-28 LAB — GLUCOSE, CAPILLARY
Glucose-Capillary: 144 mg/dL — ABNORMAL HIGH (ref 70–99)
Glucose-Capillary: 174 mg/dL — ABNORMAL HIGH (ref 70–99)
Glucose-Capillary: 208 mg/dL — ABNORMAL HIGH (ref 70–99)
Glucose-Capillary: 89 mg/dL (ref 70–99)

## 2021-10-28 LAB — FOLATE: Folate: 15.4 ng/mL (ref >5.9)

## 2021-10-28 LAB — FERRITIN: Ferritin: 223 ng/mL (ref 24–336)

## 2021-10-28 LAB — STREP PNEUMONIAE URINARY ANTIGEN: Strep Pneumo Urinary Antigen: NEGATIVE

## 2021-10-28 LAB — TROPONIN I (HIGH SENSITIVITY): Troponin I (High Sensitivity): 18 ng/L — ABNORMAL HIGH (ref <18)

## 2021-10-28 LAB — PROCALCITONIN: Procalcitonin: <0.1 ng/mL

## 2021-10-28 LAB — D-DIMER, QUANTITATIVE: D-Dimer, Quant: 1.32 ug/mL-FEU — ABNORMAL HIGH (ref 0.00–0.50)

## 2021-10-28 LAB — VITAMIN B12: Vitamin B-12: 216 pg/mL (ref 180–914)

## 2021-10-28 LAB — LACTIC ACID, PLASMA: Lactic Acid, Venous: 1.6 mmol/L (ref 0.5–1.9)

## 2021-10-28 LAB — CBG MONITORING, ED
Glucose-Capillary: 190 mg/dL — ABNORMAL HIGH (ref 70–99)
Glucose-Capillary: 288 mg/dL — ABNORMAL HIGH (ref 70–99)
Glucose-Capillary: 257 mg/dL — ABNORMAL HIGH (ref 70–99)

## 2021-10-28 MED ORDER — PIOGLITAZONE HCL 30 MG PO TABS
30.0000 mg | ORAL_TABLET | Freq: Every day | ORAL | Status: DC
  Administered 2021-10-28: 30 mg via ORAL
  Filled 2021-10-28 (×2): qty 1

## 2021-10-28 MED ORDER — VITAMIN E 45 MG (100 UNIT) PO CAPS
400.0000 [IU] | ORAL_CAPSULE | Freq: Every day | ORAL | Status: DC
  Administered 2021-10-28 – 2021-10-29 (×2): 400 [IU] via ORAL
  Filled 2021-10-28 (×2): qty 4

## 2021-10-28 MED ORDER — GLIPIZIDE 5 MG PO TABS
10.0000 mg | ORAL_TABLET | Freq: Two times a day (BID) | ORAL | Status: DC
  Administered 2021-10-28 (×2): 10 mg via ORAL
  Filled 2021-10-28 (×2): qty 2

## 2021-10-28 MED ORDER — LOSARTAN POTASSIUM 50 MG PO TABS
100.0000 mg | ORAL_TABLET | Freq: Every day | ORAL | Status: DC
  Administered 2021-10-28 – 2021-10-29 (×2): 100 mg via ORAL
  Filled 2021-10-28 (×2): qty 2

## 2021-10-28 MED ORDER — IOHEXOL 350 MG/ML SOLN
75.0000 mL | Freq: Once | INTRAVENOUS | Status: AC | PRN
Start: 2021-10-28 — End: 2021-10-28
  Administered 2021-10-28: 75 mL via INTRAVENOUS

## 2021-10-28 MED ORDER — HYDROCHLOROTHIAZIDE 25 MG PO TABS
25.0000 mg | ORAL_TABLET | Freq: Every day | ORAL | Status: DC
  Administered 2021-10-28 – 2021-10-29 (×2): 25 mg via ORAL
  Filled 2021-10-28 (×2): qty 1

## 2021-10-28 MED ORDER — ATORVASTATIN CALCIUM 80 MG PO TABS
80.0000 mg | ORAL_TABLET | Freq: Every day | ORAL | Status: DC
  Administered 2021-10-28 – 2021-10-29 (×2): 80 mg via ORAL
  Filled 2021-10-28 (×2): qty 1

## 2021-10-28 NOTE — Progress Notes (Signed)
?  Transition of Care (TOC) Screening Note ? ? ?Patient Details  ?Name: Robert [name] ?Date of Birth: [birth year] ? ? ?Transition of Care (TOC) CM/SW Contact:    ?Cyndi Bender, RN ?Phone Number: ?10/28/2021, 4:18 PM ? ? ? ?Transition of Care Department [facility]) has reviewed patient and no TOC needs have been identified at this time. We will continue to monitor patient advancement through interdisciplinary progression rounds. If new patient transition needs arise, please place a TOC consult. ? ?[place] notified [phone] ?

## 2021-10-28 NOTE — Progress Notes (Signed)
Inpatient Diabetes Program Recommendations ? ?AACE/ADA: New Consensus Statement on Inpatient Glycemic Control (2015) ? ?Target Ranges:  Prepandial:   less than 140 mg/dL ?     Peak postprandial:   less than 180 mg/dL (1-2 hours) ?     Critically ill patients:  140 - 180 mg/dL  ? ?Lab Results  ?Component Value Date  ? GLUCAP 288 (H) 10/28/2021  ? ? ?Review of Glycemic Control ? Latest Reference Range & Units 10/27/21 23:02 10/28/21 04:24 10/28/21 07:41  ?Glucose-Capillary 70 - 99 mg/dL 970 (H) 263 (H) 785 (H)  ?(H): Data is abnormally high ? ?Diabetes history: DM2 ?Outpatient Diabetes medications: Glipizide 10 mg BID, Metformin XR 500 mg QOD, Actos 30 mg QD ?Current orders for Inpatient glycemic control: Novolog 0-9 units Q4H, Solumedrol 80 mg Q12H ? ?Inpatient Diabetes Program Recommendations:   ? ?Novolog 0-20 units TID and 0-5 units QHS while receiving steroids.  Carb modified diet ordered. ? ?Will continue to follow while inpatient. ? ?Thank you, ?Dulce Sellar, MSN, RN ?Diabetes Coordinator ?Inpatient Diabetes Program ?[phone] (team pager from 8a-5p) ? ? ? ?

## 2021-10-28 NOTE — Progress Notes (Signed)
?PROGRESS NOTE ? ?[employer]  ?DOB: 1961-04-29  ?PCP: [name], MD ?IOX:[id number]  ?DOA: 10/27/2021 ? LOS: 1 day  ?Hospital Day: 2 ? ?Brief narrative: ?Robert [name] is a 61 y.o. male with PMH significant for HTN, HLD, COPD on home oxygen, nocturnal hypoxemia, pulm nodule, hiatal hernia. ?Patient presented to the ED on 4/4 with complaint of shortness of breath.   ?EMS noted an oxygen saturation of 80%, placed him on CPAP.  He was given IV Solu-Medrol, DuoNeb, IV magnesium. ? ?Upon arrival to the ED, patient placed on BiPAP due to increasing shortness of breath.   ?He was afebrile.  WBC count normal, lactic acid level elevated to 2.2 ?COVID and influenza PCR negative.   ?High-sensitivity troponin negative.   ?Blood gas without evidence of acidosis or hypercapnia.   ?Blood culture sent. ?Chest x-ray showed developing infiltration in the lung bases. ?CT angio of chest did not show evidence of pulm embolism.  Showed nonspecific areas of groundglass attenuation throughout both lungs, infectious versus inflammatory in nature. ? ?Patient was started on IV antibiotics, IV fluid, bronchodilators ?Admitted to hospitalist service ? ?Subjective: ?Patient was seen and examined this morning.  Pleasant middle-aged African-American male.  Looks older for his age.  Not in distress at the time of my evaluation.  On 4 L. ?Significant other at bedside. ? ?Principal Problem: ?  Acute exacerbation of chronic obstructive pulmonary disease (COPD) (HCC) ?Active Problems: ?  Normocytic anemia ?  HLD (hyperlipidemia) ?  HTN (hypertension) ?  Non-insulin dependent type 2 diabetes mellitus (HCC) ?  GERD (gastroesophageal reflux disease) ?  ? ?Assessment and Plan: ?Acute COPD exacerbation ?Community-acquired pneumonia ?Acute hypoxemic respiratory failure ?-Presented with shortness of breath, hypoxia with O2 sat of 80%. ?-Chest imaging as above with multifocal groundglass attenuation. ?-Blood culture sent. ?-Started on broad-spectrum  antibiotics ?-Also on Solu-Medrol 80 mg IV twice daily, DuoNeb ?-Initially required BiPAP.  Currently on 4 L oxygen.  Was using 2 L at home. ?-Continue to monitor. ?Recent Labs  ?Lab 10/27/21 ?1912 10/27/21 ?1916 10/27/21 ?2053 10/27/21 ?2320 10/28/21 ?0243 10/28/21 ?0500  ?WBC 8.0  --   --   --  7.1  --   ?LATICACIDVEN  --  2.2* 4.6*  --   --  1.6  ?PROCALCITON  --   --   --  <0.10  --   --   ? ?Type 2 diabetes mellitus ?-A1c pending ?-PTA, patient was not glipizide 10 mg twice daily, metformin 500 mg every other day, Actos 30 mg daily, ?-Currently on SSI. ?-Blood sugar level ?No results found for: HGBA1C ?Recent Labs  ?Lab 10/27/21 ?2302 10/28/21 ?0424 10/28/21 ?0741 10/28/21 ?1106 10/28/21 ?1238  ?GLUCAP 182* 190* 288* 257* 208*  ? ?Essential hypertension ?-PTA, patient was on amlodipine 5 mg daily, HCTZ 25 mg daily, losartan 100 mg daily, ?-Continue HCTZ and Losartan. Keep amlodipine on hold.  ? ?Hyperlipidemia ?-Continue Lipitor ? ?Drop in hemoglobin ?-Hemoglobin was over 15 in October 2022.  Over 10 this admission.  No active bleeding.   ?-Vitamin B12 in the low normal range at 216.  Start oral replacement.  Ferritin normal.   ?Recent Labs  ?  01/19/21 ?0301 05/18/21 ?2200 10/27/21 ?1912 10/27/21 ?1921 10/27/21 ?2320 10/28/21 ?0243  ?HGB 15.1 15.8 11.9* 10.2*  9.9*  --  12.7*  ?MCV 89.6 96.8 92.0  --   --  91.2  ?[id number]  --   --   --   --  216  --   ?FOLATE  --   --   --   --  15.4  --   ?FERRITIN  --   --   --   --  223  --   ?TIBC  --   --   --   --  305  --   ?IRON  --   --   --   --  34*  --   ?RETICCTPCT  --   --   --   --  1.9  --   ? ?GERD  ?-Continue PPI  ? ?Goals of care ?  Code Status: Full Code  ? ? ?Mobility: Encourage ambulation.  Check ambulatary oxygen requirement ? ?Nutritional status:  ?There is no height or weight on file to calculate BMI.  ?  ?  ? ? ? ? ?Diet:  ?Diet Order   ? ?       ?  Diet heart healthy/carb modified Room service appropriate? Yes; Fluid consistency: Thin  Diet  effective now       ?  ? ?  ?  ? ?  ? ? ?DVT prophylaxis: SCDs ?Place and maintain sequential compression device Start: 10/28/21 1311 ?  ?Antimicrobials: Rocephin, azithromycin ?Fluid: None ?Consultants: None ?Family Communication: Significant other at bedside ? ?Status is: Inpatient ? ?Continue in-hospital care because: Needs IV antibiotics, respiratory status monitoring ?Level of care: Telemetry Medical  ? ?Dispo: The patient is from: Home ?             Anticipated d/c is to: Hopefully home in 1 to 2 days ?             Patient currently is not medically stable to d/c. ?  Difficult to place patient No ? ? ? ? ?Infusions:  ? azithromycin    ? cefTRIAXone (ROCEPHIN)  IV    ? ? ?Scheduled Meds: ? atorvastatin  80 mg Oral Daily  ? budesonide (PULMICORT) nebulizer solution  0.25 mg Nebulization BID  ? glipiZIDE  10 mg Oral BID  ? hydrochlorothiazide  25 mg Oral Daily  ? insulin aspart  0-9 Units Subcutaneous Q4H  ? ipratropium-albuterol  3 mL Nebulization Q6H  ? losartan  100 mg Oral Daily  ? methylPREDNISolone (SOLU-MEDROL) injection  80 mg Intravenous Q12H  ? pioglitazone  30 mg Oral Daily  ? vitamin E  400 Units Oral Daily  ? ? ?PRN meds: ?acetaminophen **OR** acetaminophen, albuterol  ? ?Antimicrobials: ?Anti-infectives (From admission, onward)  ? ? Start     Dose/Rate Route Frequency Ordered Stop  ? 10/28/21 2000  azithromycin (ZITHROMAX) 500 mg in sodium chloride 0.9 % 250 mL IVPB       ? 500 mg ?250 mL/hr over 60 Minutes Intravenous Every 24 hours 10/27/21 2148    ? 10/28/21 2000  cefTRIAXone (ROCEPHIN) 1 g in sodium chloride 0.9 % 100 mL IVPB       ? 1 g ?200 mL/hr over 30 Minutes Intravenous Every 24 hours 10/27/21 2148    ? 10/27/21 2030  cefTRIAXone (ROCEPHIN) 1 g in sodium chloride 0.9 % 100 mL IVPB       ? 1 g ?200 mL/hr over 30 Minutes Intravenous  Once 10/27/21 2019 10/27/21 2149  ? 10/27/21 2030  azithromycin (ZITHROMAX) 500 mg in sodium chloride 0.9 % 250 mL IVPB       ? 500 mg ?250 mL/hr over 60  Minutes Intravenous  Once 10/27/21 2019 10/27/21 2157  ? ?  ? ? ?Objective: ?Vitals:  ? 10/28/21 1200 10/28/21 1234  ?BP: (!) 148/97 (!) 143/92  ?Pulse:  83 86  ?Resp: (!) 27 16  ?Temp:  97.8 ?F (36.6 ?C)  ?SpO2: 100%   ? ?No intake or output data in the 24 hours ending 10/28/21 1313 ?There were no vitals filed for this visit. ?Weight change:  ?There is no height or weight on file to calculate BMI.  ? ?Physical Exam: ?General exam: Pleasant, middle-aged African-American male.  Looks older for disease ?Skin: No rashes, lesions or ulcers. ?HEENT: Atraumatic, normocephalic, no obvious bleeding ?Lungs: Mild scattered rhonchi bilaterally  ?CVS: Regular rate and rhythm, no murmur ?GI/Abd soft, nontender, nondistended, bowel sound present ?CNS: Alert, awake, oriented x3 ?Psychiatry: Mood appropriate ?Extremities: No pedal edema, no calf tenderness ? ?Data Review: I have personally reviewed the laboratory data and studies available. ? ?F/u labs ordered ?Unresulted Labs (From admission, onward)  ? ?  Start     Ordered  ? 10/29/21 0500  CBC with Differential/Platelet  Tomorrow morning,   R       ? 10/28/21 1256  ? 10/29/21 0500  Basic metabolic panel  Tomorrow morning,   R       ? 10/28/21 1256  ? 10/29/21 0500  Lactic acid, plasma  Tomorrow morning,   R       ? 10/28/21 1256  ? 10/27/21 2148  Occult blood card to lab, stool  Once,   R       ? 10/27/21 2148  ? 10/27/21 2146  Hemoglobin A1c  Once,   R       ?Comments: To assess prior glycemic control ?  ? 10/27/21 2148  ? 10/27/21 2143  Legionella Pneumophila Serogp 1 Ur Ag  (COPD / Pneumonia / Cellulitis / Lower Extremity Wound)  Once,   R       ? 10/27/21 2148  ? 10/27/21 2142  MRSA Next Gen by PCR, Nasal  (COPD / Pneumonia / Cellulitis / Lower Extremity Wound)  Once,   R       ? 10/27/21 2148  ? 10/27/21 2142  Strep pneumoniae urinary antigen  (COPD / Pneumonia / Cellulitis / Lower Extremity Wound)  Once,   R       ? 10/27/21 2148  ? ?  ?  ? ?  ? ? ?Signed, ?Lorin Glass,  MD ?[facility] ?10/28/2021 ? ? ? ? ? ? ? ? ? ? ? ? ?

## 2021-10-29 LAB — BASIC METABOLIC PANEL
Anion gap: 10 (ref 5–15)
BUN: 14 mg/dL (ref 8–23)
CO2: 30 mmol/L (ref 22–32)
Calcium: 9.1 mg/dL (ref 8.9–10.3)
Chloride: 99 mmol/L (ref 98–111)
Creatinine, Ser: 0.85 mg/dL (ref 0.61–1.24)
GFR, Estimated: >60 mL/min (ref >60)
Glucose, Bld: 72 mg/dL (ref 70–99)
Potassium: 3.7 mmol/L (ref 3.5–5.1)
Sodium: 139 mmol/L (ref 135–145)

## 2021-10-29 LAB — CBC WITH DIFFERENTIAL/PLATELET
Abs Immature Granulocytes: 0.03 10*3/uL (ref 0.00–0.07)
Basophils Absolute: 0 10*3/uL (ref 0.0–0.1)
Basophils Relative: 0 %
Eosinophils Absolute: 0 10*3/uL (ref 0.0–0.5)
Eosinophils Relative: 0 %
HCT: 34.2 % — ABNORMAL LOW (ref 39.0–52.0)
Hemoglobin: 11.7 g/dL — ABNORMAL LOW (ref 13.0–17.0)
Immature Granulocytes: 0 %
Lymphocytes Relative: 10 %
Lymphs Abs: 0.7 10*3/uL (ref 0.7–4.0)
MCH: 30.5 pg (ref 26.0–34.0)
MCHC: 34.2 g/dL (ref 30.0–36.0)
MCV: 89.3 fL (ref 80.0–100.0)
Monocytes Absolute: 0.4 10*3/uL (ref 0.1–1.0)
Monocytes Relative: 6 %
Neutro Abs: 5.7 10*3/uL (ref 1.7–7.7)
Neutrophils Relative %: 84 %
Platelets: 276 10*3/uL (ref 150–400)
RBC: 3.83 MIL/uL — ABNORMAL LOW (ref 4.22–5.81)
RDW: 14 % (ref 11.5–15.5)
WBC: 6.8 10*3/uL (ref 4.0–10.5)
nRBC: 0 % (ref 0.0–0.2)

## 2021-10-29 LAB — GLUCOSE, CAPILLARY
Glucose-Capillary: 69 mg/dL — ABNORMAL LOW (ref 70–99)
Glucose-Capillary: 106 mg/dL — ABNORMAL HIGH (ref 70–99)
Glucose-Capillary: 59 mg/dL — ABNORMAL LOW (ref 70–99)
Glucose-Capillary: 139 mg/dL — ABNORMAL HIGH (ref 70–99)
Glucose-Capillary: 232 mg/dL — ABNORMAL HIGH (ref 70–99)

## 2021-10-29 LAB — LACTIC ACID, PLASMA: Lactic Acid, Venous: 1.4 mmol/L (ref 0.5–1.9)

## 2021-10-29 LAB — HEMOGLOBIN A1C
Hgb A1c MFr Bld: 5.8 % — ABNORMAL HIGH (ref 4.8–5.6)
Mean Plasma Glucose: 120 mg/dL

## 2021-10-29 LAB — LEGIONELLA PNEUMOPHILA SEROGP 1 UR AG: L. pneumophila Serogp 1 Ur Ag: NEGATIVE

## 2021-10-29 MED ORDER — IPRATROPIUM-ALBUTEROL 0.5-2.5 (3) MG/3ML IN SOLN: 3.0000 mL | Freq: Two times a day (BID) | RESPIRATORY_TRACT | Status: DC

## 2021-10-29 MED ORDER — IPRATROPIUM-ALBUTEROL 0.5-2.5 (3) MG/3ML IN SOLN
3.0000 mL | Freq: Two times a day (BID) | RESPIRATORY_TRACT | Status: DC
  Administered 2021-10-29: 3 mL via RESPIRATORY_TRACT
  Filled 2021-10-29: qty 3

## 2021-10-29 MED ORDER — ALBUTEROL SULFATE HFA 108 (90 BASE) MCG/ACT IN AERS: 2.0000 | INHALATION_SPRAY | Freq: Four times a day (QID) | RESPIRATORY_TRACT | 2 refills | Status: DC | PRN

## 2021-10-29 MED ORDER — CEFDINIR 300 MG PO CAPS: 300.0000 mg | ORAL_CAPSULE | Freq: Two times a day (BID) | ORAL | 0 refills | Status: AC

## 2021-10-29 MED ORDER — PREDNISONE 10 MG PO TABS: ORAL_TABLET | ORAL | 0 refills | Status: DC

## 2021-10-29 MED ORDER — FLUTICASONE-SALMETEROL 250-50 MCG/ACT IN AEPB: 1.0000 | INHALATION_SPRAY | Freq: Two times a day (BID) | RESPIRATORY_TRACT | 2 refills | Status: DC

## 2021-10-29 MED ORDER — VITAMIN B-12 1000 MCG PO TABS
1000.0000 ug | ORAL_TABLET | Freq: Every day | ORAL | Status: DC
  Administered 2021-10-29: 1000 ug via ORAL
  Filled 2021-10-29: qty 1

## 2021-10-29 MED ORDER — CYANOCOBALAMIN 1000 MCG PO TABS
1000.0000 ug | ORAL_TABLET | Freq: Every day | ORAL | 2 refills | Status: DC
Start: 2021-10-30 — End: 2022-01-30

## 2021-10-29 NOTE — TOC Transition Note (Signed)
Transition of Care (TOC) - CM/SW Discharge Note ? ? ?Patient Details  ?Name: Robert [name] ?MRN: [id number] ?Date of Birth: 1961/05/27 ? ?Transition of Care (TOC) CM/SW Contact:  ?Cyndi Bender, RN ?Phone Number: ?10/29/2021, 2:06 PM ? ? ?Clinical Narrative:    ?Spoke to patient regarding transition needs. Patient states he gets his home [address] through [place]. RNCM called [place] and spoke to Cat. RNCM faxed facesheet, order and ambulatory sat note to Cat at [facility].  ?Wife will bring portable tank for discharge.  ? ?Final next level of care: Home/Self Care ?Barriers to Discharge: Barriers Resolved ? ? ?Patient Goals and CMS Choice ?Patient states their goals for this hospitalization and ongoing recovery are:: return home ?  ?  ? ?Discharge Placement ?  ?           ? home ?  ?  ?  ? ?Discharge Plan and Services ?  ?  ?           ?DME Arranged: Oxygen ?DME Agency: [place] ?Date DME Agency Contacted: 10/29/21 ?Time DME Agency Contacted: [phone] ?Representative spoke with at DME Agency: Cat ?  ?  ?  ?  ?  ? ?Social Determinants of Health (SDOH) Interventions ?  ? ? ?Readmission Risk Interventions ? ?  10/29/2021  ?  2:04 PM  ?Readmission Risk Prevention Plan  ?Post Dischage Appt Complete  ?Medication Screening Complete  ?Transportation Screening Complete  ? ? ? ? ? ?

## 2021-10-29 NOTE — Progress Notes (Signed)
No respiratory distress noted. Pt resting comfortably in bed. No BIPAP needed at this time.  ?

## 2021-10-29 NOTE — Progress Notes (Signed)
Hypoglycemic Event ? ?CBG: 59 ? ?Treatment: 4 oz juice/soda ? ?Symptoms: None ? ?Follow-up CBG: Time:0512 CBG Result:106 ? ?Possible Reasons for Event: Medication regimen:   ? ?Comments/MD notified:Dr. Shalhoub ? ? ? ?Charlynn Grimes ? ? ?

## 2021-10-29 NOTE — Progress Notes (Signed)
HOSPITAL MEDICINE OVERNIGHT EVENT NOTE   ? ?Blood sugar noted to be 59 this morning requiring administration of orange juice per protocol. ? ?Chart reviewed, patient is currently on Actos and glipizide.  These medications have been discontinued for now until blood sugars normalize.  Continue to monitor blood sugars closely. ? ?Marinda Elk  MD ?[facility]  ? ? ? ? ? ? ? ? ? ? ?

## 2021-10-29 NOTE — Discharge Summary (Signed)
? ?Physician Discharge Summary  ?Robert [name] [id number] DOB: 09/10/1960 DOA: 10/27/2021 ? ?PCP: Chesley Mires, MD ? ?Admit date: 10/27/2021 ?Discharge date: 10/29/2021 ? ?Admitted From: Home ?Discharge disposition: Home ? ?Recommendations at discharge:  ?Complete the course of antibiotics and tapering course of prednisone ?Stop glipizide.  Continue metformin and Actos ?Stop amlodipine.  Continue HCTZ and losartan ?Follow-up with pulmonology as an outpatient ? ?Brief narrative: ?Robert [name] is a 61 y.o. male with PMH significant for HTN, HLD, COPD on home oxygen, nocturnal hypoxemia, pulm nodule, hiatal hernia. ?Patient presented to the ED on 4/4 with complaint of shortness of breath.   ?EMS noted an oxygen saturation of 80%, placed him on CPAP.  He was given IV Solu-Medrol, DuoNeb, IV magnesium. ? ?Upon arrival to the ED, patient placed on BiPAP due to increasing shortness of breath.   ?He was afebrile.  WBC count normal, lactic acid level elevated to 2.2 ?COVID and influenza PCR negative.   ?High-sensitivity troponin negative.   ?Blood gas without evidence of acidosis or hypercapnia.   ?Blood culture sent. ?Chest x-ray showed developing infiltration in the lung bases. ?CT angio of chest did not show evidence of pulm embolism.  Showed nonspecific areas of groundglass attenuation throughout both lungs, infectious versus inflammatory in nature. ? ?Patient was started on IV antibiotics, IV fluid, bronchodilators ?Admitted to hospitalist service ? ?Subjective: ?Patient was seen and examined this morning.  ?Propped up in bed.  Not in distress.  On 3 L oxygen but is improving.  Feels much better.  ?Able to ambulate in the hallway. Needed 3 lpm O2. ? ?Principal Problem: ?  Acute exacerbation of chronic obstructive pulmonary disease (COPD) ([place]) ?Active Problems: ?  Normocytic anemia ?  HLD (hyperlipidemia) ?  HTN (hypertension) ?  Non-insulin dependent type 2 diabetes mellitus ([place]) ?  GERD (gastroesophageal reflux  disease) ?  ? ?Hospital course: ?Acute COPD exacerbation ?Community-acquired pneumonia ?Acute hypoxemic respiratory failure ?-Presented with shortness of breath, hypoxia with O2 sat of 80%. ?-Chest imaging as above with multifocal groundglass attenuation. ?-Blood culture sent. ?-Started on broad-spectrum antibiotics, IV steroids. ?-Discharged home on 5 more days of oral Omnicef. ?-Initially required BiPAP.  Currently on 2 to 3 L oxygen at rest and ambulation.  I will stop ?Recent Labs  ?Lab 10/27/21 ?1912 10/27/21 ?1916 10/27/21 ?2053 10/27/21 ?2320 10/28/21 ?0243 10/28/21 ?0500 10/29/21 ?0252  ?WBC 8.0  --   --   --  7.1  --  6.8  ?LATICACIDVEN  --  2.2* 4.6*  --   --  1.6 1.4  ?PROCALCITON  --   --   --  <0.10  --   --   --   ? ?Type 2 diabetes mellitus ?-A1c 5.8 on 10/27/2021.   ?PTA, patient was on glipizide 10 mg twice daily, metformin 500 mg every other day, Actos 30 mg daily. ?-Patient was continued on glipizide and Actos.  Metformin was held.  As of this morning, he had a low blood sugar of 59.  Based on his tightly controlled A1c, I think it will be reasonable to stop glipizide at discharge. Okay to resume metformin and Actos. ? ?Essential hypertension ?-PTA, patient was on amlodipine 5 mg daily, HCTZ 25 mg daily, losartan 100 mg daily, ?-Continue HCTZ and Losartan.  Currently amlodipine is on hold.  Blood pressure is normal in 130s.   ?-I would continue to hold amlodipine at discharge.   ?-Recommend to monitor blood pressure at home and resume amlodipine if systolic blood pressure is  over 140. ? ?Hyperlipidemia ?-Continue Lipitor ? ?Drop in hemoglobin ?-Hemoglobin was over 15 in October 2022.  Over 10 this admission.  No active bleeding.   ?-Vitamin B12 in the low normal range at 216.  Started oral replacement.  Ferritin normal.   ?Recent Labs  ?  05/18/21 ?2200 10/27/21 ?1912 10/27/21 ?1921 10/27/21 ?2320 10/28/21 ?0243 10/29/21 ?0252  ?HGB 15.8 11.9* 10.2*  9.9*  --  12.7* 11.7*  ?MCV 96.8 92.0  --   --   91.2 89.3  ?[id number]  --   --   --  216  --   --   ?FOLATE  --   --   --  15.4  --   --   ?FERRITIN  --   --   --  223  --   --   ?TIBC  --   --   --  305  --   --   ?IRON  --   --   --  34*  --   --   ?RETICCTPCT  --   --   --  1.9  --   --   ? ?GERD  ?-Continue PPI  ? ?Goals of care ?  Code Status: Full Code  ? ? ? ?Wounds:  ?- ?  ? ?Discharge Exam:  ? ?Vitals:  ? 10/29/21 0734 10/29/21 0843 10/29/21 0900 10/29/21 1214  ?BP: 135/81   136/87  ?Pulse: 94   (!) 103  ?Resp: 14   15  ?Temp: 97.6 ?F (36.4 ?C)   97.7 ?F (36.5 ?C)  ?TempSrc: Oral   Oral  ?SpO2: 100% 99% 99% 97%  ? ? ?There is no height or weight on file to calculate BMI.  ?General exam: Pleasant, middle-aged African-American male.  Looks older for his age ?Skin: No rashes, lesions or ulcers. ?HEENT: Atraumatic, normocephalic, no obvious bleeding ?Lungs: Clear to auscultation bilaterally ?CVS: Regular rate and rhythm, no murmur ?GI/Abd soft, nontender, nondistended, bowel sound present ?CNS: Alert, awake, oriented x3 ?Psychiatry: Mood appropriate ?Extremities: No pedal edema, no calf tenderness ? ?Follow ups:  ? ? Follow-up Information   ? ? Chesley Mires, MD Follow up.   ?Specialty: Pulmonary Disease ?Contact information: ?[address] ?[address] ?[place] [address] ?[phone] ? ? ?  ?  ? ?  ?  ? ?  ? ? ?Discharge Instructions:  ? ?Discharge Instructions   ? ? Call MD for:  difficulty breathing, headache or visual disturbances   Complete by: As directed ?  ? Call MD for:  extreme fatigue   Complete by: As directed ?  ? Call MD for:  hives   Complete by: As directed ?  ? Call MD for:  persistant dizziness or light-headedness   Complete by: As directed ?  ? Call MD for:  persistant nausea and vomiting   Complete by: As directed ?  ? Call MD for:  severe uncontrolled pain   Complete by: As directed ?  ? Call MD for:  temperature >100.4   Complete by: As directed ?  ? Diet general   Complete by: As directed ?  ? Discharge instructions   Complete by:  As directed ?  ? Recommendations at discharge:  ?? Complete the course of antibiotics and tapering course of prednisone ?? Stop glipizide.  Continue metformin and Actos ?? Stop amlodipine.  Continue HCTZ and losartan ?? Follow-up with pulmonology as an outpatient ? ?Discharge instructions for diabetes mellitus: ?Check blood sugar 3 times a  day and bedtime at home. ?If blood sugar running above 200 or less than 70 please call your MD to adjust insulin. ?If you notice signs and symptoms of hypoglycemia (low blood sugar) like jitteriness, confusion, thirst, tremor and sweating, please check blood sugar, drink sugary drink/biscuits/sweets to increase sugar level and call MD or return to ER.  ? Increase activity slowly   Complete by: As directed ?  ? ?  ? ? ?Discharge Medications:  ? ?Allergies as of 10/29/2021   ? ?   Reactions  ? Ace Inhibitors Cough  ? Aspirin Other (See Comments)  ? Pt does not remember reaction  ? Latex Other (See Comments)  ? Pt does not remember reaction  ? ?  ? ?  ?Medication List  ?  ? ?STOP taking these medications   ? ?glipiZIDE 10 MG tablet ?Commonly known as: GLUCOTROL ?  ? ?  ? ?TAKE these medications   ? ?acetaminophen 500 MG tablet ?Commonly known as: TYLENOL ?Take 1,500 mg by mouth every 6 (six) hours as needed for mild pain. ?  ?albuterol 108 (90 Base) MCG/ACT inhaler ?Commonly known as: VENTOLIN HFA ?Inhale 2 puffs into the lungs every 6 (six) hours as needed for wheezing or shortness of breath. ?  ?amLODipine 10 MG tablet ?Commonly known as: NORVASC ?Take 5 mg by mouth daily. ?  ?atorvastatin 80 MG tablet ?Commonly known as: LIPITOR ?Take 80 mg by mouth daily. ?  ?cefdinir 300 MG capsule ?Commonly known as: OMNICEF ?Take 1 capsule (300 mg total) by mouth 2 (two) times daily for 5 days. ?  ?cetirizine 10 MG tablet ?Commonly known as: ZYRTEC ?Take 10 mg by mouth daily. ?  ?Cholecalciferol 25 MCG (1000 UT) tablet ?Take 2,000 Units by mouth daily. ?  ?clotrimazole 1 % cream ?Commonly known  as: LOTRIMIN ?Apply 1 application. topically daily. ?  ?COD LIVER OIL PO ?Take 1 capsule by mouth daily. ?  ?cyanocobalamin 1000 MCG tablet ?Take 1 tablet (1,000 mcg total) by mouth daily. ?Start taking

## 2021-10-29 NOTE — Progress Notes (Addendum)
SATURATION QUALIFICATIONS: (This note is used to comply with regulatory documentation for home oxygen) ? ?Patient Saturations on Room Air at Rest = 88% ? ?Patient Saturations on Room Air while Ambulating = 84% ? ?Patient Saturations on 3 Liters of oxygen at Rest = 98% ? ?Patient Saturations on 3 Liters of oxygen while Ambulating = 94% ? ?Please briefly explain why patient needs home oxygen: ?

## 2021-11-01 LAB — CULTURE, BLOOD (ROUTINE X 2)
Culture: NO GROWTH
Culture: NO GROWTH

## 2022-01-27 ENCOUNTER — Emergency Department (HOSPITAL_COMMUNITY): Payer: No Typology Code available for payment source

## 2022-01-27 ENCOUNTER — Inpatient Hospital Stay (HOSPITAL_COMMUNITY)
Admission: EM | Admit: 2022-01-27 | Discharge: 2022-01-30 | DRG: 190 | Disposition: A | Discharge status: Home / Self Care | Payer: No Typology Code available for payment source | Attending: Internal Medicine | Admitting: Internal Medicine

## 2022-01-27 ENCOUNTER — Encounter (HOSPITAL_COMMUNITY): Payer: Self-pay | Admitting: Emergency Medicine

## 2022-01-27 DIAGNOSIS — Z79899 Other long term (current) drug therapy: Secondary | ICD-10-CM

## 2022-01-27 DIAGNOSIS — Z87442 Personal history of urinary calculi: Secondary | ICD-10-CM

## 2022-01-27 DIAGNOSIS — E119 Type 2 diabetes mellitus without complications: Secondary | ICD-10-CM

## 2022-01-27 DIAGNOSIS — J9622 Acute and chronic respiratory failure with hypercapnia: Secondary | ICD-10-CM | POA: Diagnosis present

## 2022-01-27 DIAGNOSIS — J439 Emphysema, unspecified: Secondary | ICD-10-CM | POA: Diagnosis not present

## 2022-01-27 DIAGNOSIS — Z886 Allergy status to analgesic agent status: Secondary | ICD-10-CM

## 2022-01-27 DIAGNOSIS — Z87891 Personal history of nicotine dependence: Secondary | ICD-10-CM

## 2022-01-27 DIAGNOSIS — J9621 Acute and chronic respiratory failure with hypoxia: Secondary | ICD-10-CM | POA: Diagnosis present

## 2022-01-27 DIAGNOSIS — Z20822 Contact with and (suspected) exposure to covid-19: Secondary | ICD-10-CM | POA: Diagnosis present

## 2022-01-27 DIAGNOSIS — Z8249 Family history of ischemic heart disease and other diseases of the circulatory system: Secondary | ICD-10-CM

## 2022-01-27 DIAGNOSIS — J9611 Chronic respiratory failure with hypoxia: Secondary | ICD-10-CM | POA: Diagnosis present

## 2022-01-27 DIAGNOSIS — Z888 Allergy status to other drugs, medicaments and biological substances status: Secondary | ICD-10-CM

## 2022-01-27 DIAGNOSIS — Z7984 Long term (current) use of oral hypoglycemic drugs: Secondary | ICD-10-CM

## 2022-01-27 DIAGNOSIS — T380X5A Adverse effect of glucocorticoids and synthetic analogues, initial encounter: Secondary | ICD-10-CM | POA: Diagnosis present

## 2022-01-27 DIAGNOSIS — E669 Obesity, unspecified: Secondary | ICD-10-CM | POA: Diagnosis present

## 2022-01-27 DIAGNOSIS — J441 Chronic obstructive pulmonary disease with (acute) exacerbation: Secondary | ICD-10-CM | POA: Diagnosis not present

## 2022-01-27 DIAGNOSIS — Z6839 Body mass index (BMI) 39.0-39.9, adult: Secondary | ICD-10-CM

## 2022-01-27 DIAGNOSIS — J9601 Acute respiratory failure with hypoxia: Secondary | ICD-10-CM | POA: Diagnosis present

## 2022-01-27 DIAGNOSIS — E876 Hypokalemia: Secondary | ICD-10-CM | POA: Diagnosis present

## 2022-01-27 DIAGNOSIS — Z91040 Latex allergy status: Secondary | ICD-10-CM

## 2022-01-27 DIAGNOSIS — J302 Other seasonal allergic rhinitis: Secondary | ICD-10-CM | POA: Diagnosis present

## 2022-01-27 DIAGNOSIS — J449 Chronic obstructive pulmonary disease, unspecified: Secondary | ICD-10-CM | POA: Diagnosis present

## 2022-01-27 DIAGNOSIS — J9612 Chronic respiratory failure with hypercapnia: Secondary | ICD-10-CM | POA: Diagnosis present

## 2022-01-27 DIAGNOSIS — E1165 Type 2 diabetes mellitus with hyperglycemia: Secondary | ICD-10-CM | POA: Diagnosis present

## 2022-01-27 DIAGNOSIS — E785 Hyperlipidemia, unspecified: Secondary | ICD-10-CM | POA: Diagnosis present

## 2022-01-27 DIAGNOSIS — I10 Essential (primary) hypertension: Secondary | ICD-10-CM | POA: Diagnosis present

## 2022-01-27 DIAGNOSIS — Z7951 Long term (current) use of inhaled steroids: Secondary | ICD-10-CM

## 2022-01-27 LAB — CBC
HCT: 38.3 % — ABNORMAL LOW (ref 39.0–52.0)
Hemoglobin: 12.4 g/dL — ABNORMAL LOW (ref 13.0–17.0)
MCH: 29 pg (ref 26.0–34.0)
MCHC: 32.4 g/dL (ref 30.0–36.0)
MCV: 89.5 fL (ref 80.0–100.0)
Platelets: 381 10*3/uL (ref 150–400)
RBC: 4.28 MIL/uL (ref 4.22–5.81)
RDW: 14.6 % (ref 11.5–15.5)
WBC: 6.6 10*3/uL (ref 4.0–10.5)
nRBC: 0 % (ref 0.0–0.2)

## 2022-01-27 LAB — BASIC METABOLIC PANEL
Anion gap: 9 (ref 5–15)
BUN: 9 mg/dL (ref 8–23)
CO2: 36 mmol/L — ABNORMAL HIGH (ref 22–32)
Calcium: 9.2 mg/dL (ref 8.9–10.3)
Chloride: 96 mmol/L — ABNORMAL LOW (ref 98–111)
Creatinine, Ser: 0.82 mg/dL (ref 0.61–1.24)
GFR, Estimated: >60 mL/min (ref >60)
Glucose, Bld: 148 mg/dL — ABNORMAL HIGH (ref 70–99)
Potassium: 4.2 mmol/L (ref 3.5–5.1)
Sodium: 141 mmol/L (ref 135–145)

## 2022-01-27 NOTE — ED Provider Triage Note (Signed)
Emergency Medicine Provider Triage Evaluation Note  Robert [name] , a 61 y.o. male  was evaluated in triage.  Pt complains of shortness of breath, cough productive with mucus.  Symptoms have been going on for the past few days.  Was on daily supplemental oxygen since October up until last month after being hospitalized for respiratory failure.  States that due to oxygen saturations above 90% on room air his provider informed him that they will need to discontinue the supplemental oxygen. Has been using his inhalers with only minimal improvement in symptoms.  Denies any chest pain  Review of Systems  Positive: Shortness of breath, cough Negative: Chest pain  Physical Exam  BP (!) 173/102 (BP Location: Right Arm)   Pulse (!) 106   Temp 98 F (36.7 C) (Oral)   Resp 16   SpO2 95%  Gen:   Awake, no distress   Resp:  Normal effort  MSK:   Moves extremities without difficulty  Other:  Patient tachycardic, no wheezing heard on exam.  Medical Decision Making  Medically screening exam initiated at 5:29 PM.  Appropriate orders placed.  Reinhart Saulters was informed that the remainder of the evaluation will be completed by another provider, this initial triage assessment does not replace that evaluation, and the importance of remaining in the ED until their evaluation is complete.  Patient hypoxic to 86% on room air was placed on supplemental oxygen here.  Will initiate work-up.   Dietrich Pates, PA-C 01/27/22 1733

## 2022-01-27 NOTE — ED Triage Notes (Signed)
Patient here with complaint of shortness of breath that started two weeks ago. Denies chest pain. History of COPD, room air SpO2 86% a rest.

## 2022-01-28 ENCOUNTER — Other Ambulatory Visit: Payer: Self-pay

## 2022-01-28 ENCOUNTER — Encounter (HOSPITAL_COMMUNITY): Payer: Self-pay | Admitting: Internal Medicine

## 2022-01-28 DIAGNOSIS — Z87891 Personal history of nicotine dependence: Secondary | ICD-10-CM | POA: Diagnosis not present

## 2022-01-28 DIAGNOSIS — J9601 Acute respiratory failure with hypoxia: Secondary | ICD-10-CM | POA: Diagnosis present

## 2022-01-28 DIAGNOSIS — J449 Chronic obstructive pulmonary disease, unspecified: Secondary | ICD-10-CM | POA: Diagnosis present

## 2022-01-28 DIAGNOSIS — Z87442 Personal history of urinary calculi: Secondary | ICD-10-CM | POA: Diagnosis not present

## 2022-01-28 DIAGNOSIS — Z886 Allergy status to analgesic agent status: Secondary | ICD-10-CM | POA: Diagnosis not present

## 2022-01-28 DIAGNOSIS — J9611 Chronic respiratory failure with hypoxia: Secondary | ICD-10-CM | POA: Diagnosis not present

## 2022-01-28 DIAGNOSIS — Z7984 Long term (current) use of oral hypoglycemic drugs: Secondary | ICD-10-CM | POA: Diagnosis not present

## 2022-01-28 DIAGNOSIS — E119 Type 2 diabetes mellitus without complications: Secondary | ICD-10-CM | POA: Diagnosis not present

## 2022-01-28 DIAGNOSIS — E669 Obesity, unspecified: Secondary | ICD-10-CM | POA: Diagnosis present

## 2022-01-28 DIAGNOSIS — J302 Other seasonal allergic rhinitis: Secondary | ICD-10-CM | POA: Diagnosis present

## 2022-01-28 DIAGNOSIS — J439 Emphysema, unspecified: Secondary | ICD-10-CM | POA: Diagnosis present

## 2022-01-28 DIAGNOSIS — I10 Essential (primary) hypertension: Secondary | ICD-10-CM

## 2022-01-28 DIAGNOSIS — E785 Hyperlipidemia, unspecified: Secondary | ICD-10-CM | POA: Diagnosis present

## 2022-01-28 DIAGNOSIS — E1165 Type 2 diabetes mellitus with hyperglycemia: Secondary | ICD-10-CM | POA: Diagnosis present

## 2022-01-28 DIAGNOSIS — J441 Chronic obstructive pulmonary disease with (acute) exacerbation: Secondary | ICD-10-CM | POA: Diagnosis present

## 2022-01-28 DIAGNOSIS — Z91040 Latex allergy status: Secondary | ICD-10-CM | POA: Diagnosis not present

## 2022-01-28 DIAGNOSIS — J9621 Acute and chronic respiratory failure with hypoxia: Secondary | ICD-10-CM | POA: Diagnosis present

## 2022-01-28 DIAGNOSIS — Z79899 Other long term (current) drug therapy: Secondary | ICD-10-CM | POA: Diagnosis not present

## 2022-01-28 DIAGNOSIS — Z888 Allergy status to other drugs, medicaments and biological substances status: Secondary | ICD-10-CM | POA: Diagnosis not present

## 2022-01-28 DIAGNOSIS — J9622 Acute and chronic respiratory failure with hypercapnia: Secondary | ICD-10-CM | POA: Diagnosis present

## 2022-01-28 DIAGNOSIS — E876 Hypokalemia: Secondary | ICD-10-CM | POA: Diagnosis present

## 2022-01-28 DIAGNOSIS — J9612 Chronic respiratory failure with hypercapnia: Secondary | ICD-10-CM

## 2022-01-28 DIAGNOSIS — T380X5A Adverse effect of glucocorticoids and synthetic analogues, initial encounter: Secondary | ICD-10-CM | POA: Diagnosis present

## 2022-01-28 DIAGNOSIS — Z6839 Body mass index (BMI) 39.0-39.9, adult: Secondary | ICD-10-CM | POA: Diagnosis not present

## 2022-01-28 DIAGNOSIS — Z20822 Contact with and (suspected) exposure to covid-19: Secondary | ICD-10-CM | POA: Diagnosis present

## 2022-01-28 DIAGNOSIS — Z8249 Family history of ischemic heart disease and other diseases of the circulatory system: Secondary | ICD-10-CM | POA: Diagnosis not present

## 2022-01-28 LAB — I-STAT ARTERIAL BLOOD GAS, ED
Acid-Base Excess: 12 mmol/L — ABNORMAL HIGH (ref 0.0–2.0)
Acid-Base Excess: 12 mmol/L — ABNORMAL HIGH (ref 0.0–2.0)
Bicarbonate: 40.6 mmol/L — ABNORMAL HIGH (ref 20.0–28.0)
Bicarbonate: 41.2 mmol/L — ABNORMAL HIGH (ref 20.0–28.0)
Calcium, Ion: 1.15 mmol/L (ref 1.15–1.40)
Calcium, Ion: 1.17 mmol/L (ref 1.15–1.40)
HCT: 38 % — ABNORMAL LOW (ref 39.0–52.0)
HCT: 38 % — ABNORMAL LOW (ref 39.0–52.0)
Hemoglobin: 12.9 g/dL — ABNORMAL LOW (ref 13.0–17.0)
Hemoglobin: 12.9 g/dL — ABNORMAL LOW (ref 13.0–17.0)
O2 Saturation: 44 %
O2 Saturation: 70 %
Patient temperature: 98.6
Patient temperature: 98.6
Potassium: 2.9 mmol/L — ABNORMAL LOW (ref 3.5–5.1)
Potassium: 3.1 mmol/L — ABNORMAL LOW (ref 3.5–5.1)
Sodium: 138 mmol/L (ref 135–145)
Sodium: 138 mmol/L (ref 135–145)
TCO2: 43 mmol/L — ABNORMAL HIGH (ref 22–32)
TCO2: 43 mmol/L — ABNORMAL HIGH (ref 22–32)
pCO2 arterial: 73.7 mmHg (ref 32–48)
pCO2 arterial: 74.3 mmHg (ref 32–48)
pH, Arterial: 7.349 — ABNORMAL LOW (ref 7.35–7.45)
pH, Arterial: 7.352 (ref 7.35–7.45)
pO2, Arterial: 27 mmHg — CL (ref 83–108)
pO2, Arterial: 40 mmHg — CL (ref 83–108)

## 2022-01-28 LAB — TROPONIN I (HIGH SENSITIVITY): Troponin I (High Sensitivity): 23 ng/L — ABNORMAL HIGH (ref <18)

## 2022-01-28 LAB — CBG MONITORING, ED
Glucose-Capillary: 187 mg/dL — ABNORMAL HIGH (ref 70–99)
Glucose-Capillary: 255 mg/dL — ABNORMAL HIGH (ref 70–99)
Glucose-Capillary: 229 mg/dL — ABNORMAL HIGH (ref 70–99)

## 2022-01-28 LAB — RESP PANEL BY RT-PCR (FLU A&B, COVID) ARPGX2
Influenza A by PCR: NEGATIVE
Influenza B by PCR: NEGATIVE
SARS Coronavirus 2 by RT PCR: NEGATIVE

## 2022-01-28 LAB — HIV ANTIBODY (ROUTINE TESTING W REFLEX): HIV Screen 4th Generation wRfx: NONREACTIVE

## 2022-01-28 LAB — GLUCOSE, CAPILLARY
Glucose-Capillary: 224 mg/dL — ABNORMAL HIGH (ref 70–99)
Glucose-Capillary: 177 mg/dL — ABNORMAL HIGH (ref 70–99)

## 2022-01-28 LAB — BRAIN NATRIURETIC PEPTIDE: B Natriuretic Peptide: 418.1 pg/mL — ABNORMAL HIGH (ref 0.0–100.0)

## 2022-01-28 MED ORDER — PANTOPRAZOLE SODIUM 40 MG PO TBEC
40.0000 mg | DELAYED_RELEASE_TABLET | Freq: Two times a day (BID) | ORAL | Status: DC
  Administered 2022-01-28 – 2022-01-30 (×5): 40 mg via ORAL
  Filled 2022-01-28 (×5): qty 1

## 2022-01-28 MED ORDER — AMLODIPINE BESYLATE 5 MG PO TABS
5.0000 mg | ORAL_TABLET | Freq: Every day | ORAL | Status: DC
  Administered 2022-01-28 – 2022-01-30 (×3): 5 mg via ORAL
  Filled 2022-01-28 (×3): qty 1

## 2022-01-28 MED ORDER — BUDESONIDE 0.5 MG/2ML IN SUSP
0.5000 mg | Freq: Two times a day (BID) | RESPIRATORY_TRACT | Status: DC
  Administered 2022-01-28 – 2022-01-30 (×5): 0.5 mg via RESPIRATORY_TRACT
  Filled 2022-01-28 (×5): qty 2

## 2022-01-28 MED ORDER — PREDNISONE 20 MG PO TABS
40.0000 mg | ORAL_TABLET | Freq: Every day | ORAL | Status: DC
  Administered 2022-01-28 – 2022-01-30 (×3): 40 mg via ORAL
  Filled 2022-01-28 (×3): qty 2

## 2022-01-28 MED ORDER — IPRATROPIUM-ALBUTEROL 0.5-2.5 (3) MG/3ML IN SOLN
3.0000 mL | Freq: Four times a day (QID) | RESPIRATORY_TRACT | Status: DC
  Administered 2022-01-28 – 2022-01-29 (×5): 3 mL via RESPIRATORY_TRACT
  Filled 2022-01-28 (×5): qty 3

## 2022-01-28 MED ORDER — ATORVASTATIN CALCIUM 80 MG PO TABS
80.0000 mg | ORAL_TABLET | Freq: Every day | ORAL | Status: DC
  Administered 2022-01-28 – 2022-01-30 (×3): 80 mg via ORAL
  Filled 2022-01-28: qty 1
  Filled 2022-01-28: qty 2
  Filled 2022-01-28: qty 1

## 2022-01-28 MED ORDER — LORATADINE 10 MG PO TABS
10.0000 mg | ORAL_TABLET | Freq: Every day | ORAL | Status: DC
  Administered 2022-01-28 – 2022-01-30 (×3): 10 mg via ORAL
  Filled 2022-01-28 (×2): qty 1

## 2022-01-28 MED ORDER — ONDANSETRON HCL 4 MG/2ML IJ SOLN: 4.0000 mg | Freq: Four times a day (QID) | INTRAMUSCULAR | Status: DC | PRN

## 2022-01-28 MED ORDER — SODIUM CHLORIDE 0.9 % IV SOLN
1.0000 g | Freq: Every day | INTRAVENOUS | Status: DC
  Administered 2022-01-28 – 2022-01-30 (×3): 1 g via INTRAVENOUS
  Filled 2022-01-28 (×3): qty 10

## 2022-01-28 MED ORDER — MOMETASONE FURO-FORMOTEROL FUM 200-5 MCG/ACT IN AERO: 2.0000 | INHALATION_SPRAY | Freq: Two times a day (BID) | RESPIRATORY_TRACT | Status: DC

## 2022-01-28 MED ORDER — ACETAMINOPHEN 325 MG PO TABS: 650.0000 mg | ORAL_TABLET | Freq: Four times a day (QID) | ORAL | Status: DC | PRN

## 2022-01-28 MED ORDER — ENOXAPARIN SODIUM 40 MG/0.4ML IJ SOSY
40.0000 mg | PREFILLED_SYRINGE | INTRAMUSCULAR | Status: DC
  Administered 2022-01-28 – 2022-01-30 (×3): 40 mg via SUBCUTANEOUS
  Filled 2022-01-28 (×3): qty 0.4

## 2022-01-28 MED ORDER — MAGNESIUM SULFATE 2 GM/50ML IV SOLN
2.0000 g | Freq: Once | INTRAVENOUS | Status: AC
  Administered 2022-01-28: 2 g via INTRAVENOUS
  Filled 2022-01-28: qty 50

## 2022-01-28 MED ORDER — ALBUTEROL SULFATE (2.5 MG/3ML) 0.083% IN NEBU
2.5000 mg | INHALATION_SOLUTION | RESPIRATORY_TRACT | Status: DC | PRN
  Administered 2022-01-30 (×2): 2.5 mg via RESPIRATORY_TRACT
  Filled 2022-01-28 (×2): qty 3

## 2022-01-28 MED ORDER — FUROSEMIDE 10 MG/ML IJ SOLN
20.0000 mg | Freq: Once | INTRAMUSCULAR | Status: AC
  Administered 2022-01-28: 20 mg via INTRAVENOUS
  Filled 2022-01-28: qty 2

## 2022-01-28 MED ORDER — ALBUTEROL SULFATE (2.5 MG/3ML) 0.083% IN NEBU
5.0000 mg | INHALATION_SOLUTION | Freq: Once | RESPIRATORY_TRACT | Status: AC
  Administered 2022-01-28: 5 mg via RESPIRATORY_TRACT
  Filled 2022-01-28: qty 6

## 2022-01-28 MED ORDER — UMECLIDINIUM BROMIDE 62.5 MCG/ACT IN AEPB: 1.0000 | INHALATION_SPRAY | Freq: Every day | RESPIRATORY_TRACT | Status: DC

## 2022-01-28 MED ORDER — IPRATROPIUM-ALBUTEROL 0.5-2.5 (3) MG/3ML IN SOLN
3.0000 mL | RESPIRATORY_TRACT | Status: AC
  Filled 2022-01-28: qty 3
  Filled 2022-01-28: qty 6

## 2022-01-28 MED ORDER — INSULIN ASPART 100 UNIT/ML IJ SOLN: 0.0000 [IU] | Freq: Every day | INTRAMUSCULAR | Status: DC

## 2022-01-28 MED ORDER — POTASSIUM CHLORIDE CRYS ER 20 MEQ PO TBCR
40.0000 meq | EXTENDED_RELEASE_TABLET | Freq: Once | ORAL | Status: AC
  Administered 2022-01-28: 40 meq via ORAL
  Filled 2022-01-28: qty 2

## 2022-01-28 MED ORDER — IPRATROPIUM-ALBUTEROL 0.5-2.5 (3) MG/3ML IN SOLN
3.0000 mL | RESPIRATORY_TRACT | Status: AC
  Administered 2022-01-28 (×3): 3 mL via RESPIRATORY_TRACT
  Filled 2022-01-28 (×2): qty 3

## 2022-01-28 MED ORDER — INSULIN ASPART 100 UNIT/ML IJ SOLN
0.0000 [IU] | Freq: Three times a day (TID) | INTRAMUSCULAR | Status: DC
  Administered 2022-01-28: 5 [IU] via SUBCUTANEOUS
  Administered 2022-01-28: 8 [IU] via SUBCUTANEOUS
  Administered 2022-01-28 – 2022-01-29 (×3): 3 [IU] via SUBCUTANEOUS
  Administered 2022-01-30: 5 [IU] via SUBCUTANEOUS
  Administered 2022-01-30: 3 [IU] via SUBCUTANEOUS

## 2022-01-28 NOTE — Plan of Care (Signed)
  Problem: Education: Goal: Ability to describe self-care measures that may prevent or decrease complications (Diabetes Survival Skills Education) will improve Outcome: Progressing Goal: Individualized Educational Video(s) Outcome: Progressing   Problem: Coping: Goal: Ability to adjust to condition or change in health will improve Outcome: Progressing   

## 2022-01-28 NOTE — Assessment & Plan Note (Signed)
Home med rec pending. Plan to continue home BP meds when completed.

## 2022-01-28 NOTE — ED Notes (Signed)
PT at bedside.

## 2022-01-28 NOTE — Progress Notes (Signed)
ABG was stuck twice and ran twice verifying results. Sample being venous first time and mixed the second. Critical CO2 results given to Laban Emperor, MD. No further orders at this time.

## 2022-01-28 NOTE — Assessment & Plan Note (Addendum)
Control likely to worsen with steroid use for treatment of COPD exacerbation today. Hold home PO meds Mod scale SSI AC/HS for the moment, may need to increase.

## 2022-01-28 NOTE — ED Notes (Signed)
Pt sitting on the side of bed eating breakfast.  Pt provided additional packets of Splenda and plasticware.

## 2022-01-28 NOTE — ED Provider Notes (Signed)
[facility] EMERGENCY DEPARTMENT Provider Note   CSN: [id number] Arrival date & time: 01/27/22  1657     History  Chief Complaint  Patient presents with   Shortness of Breath    Robert [name] is a 61 y.o. male.  61 yo M with a chief complaints of shortness of breath.  He tells me this been going on for at least a month.  He was taken off his home oxygen they told him that he no longer needed it.  Since then he has been feeling quite short of breath and got worse today and came here for evaluation.  Is a history of COPD and has been taking his medications but without much improvement.  He denies worsening leg swelling denies orthopnea denies PND.  Denies cough congestion or fever.   Shortness of Breath      Home Medications Prior to Admission medications   Medication Sig Start Date End Date Taking? Authorizing Provider  acetaminophen (TYLENOL) 500 MG tablet Take 1,500 mg by mouth every 6 (six) hours as needed for mild pain.    [provider]  albuterol (VENTOLIN HFA) 108 (90 Base) MCG/ACT inhaler Inhale 2 puffs into the lungs every 6 (six) hours as needed for wheezing or shortness of breath. 10/29/21   Lorin Glass, MD  amLODipine (NORVASC) 10 MG tablet Take 5 mg by mouth daily. 03/27/21   [provider]  atorvastatin (LIPITOR) 80 MG tablet Take 80 mg by mouth daily.    [provider]  cetirizine (ZYRTEC) 10 MG tablet Take 10 mg by mouth daily.      [provider]  Cholecalciferol 25 MCG (1000 UT) tablet Take 2,000 Units by mouth daily. 03/27/21   [provider]  clotrimazole (LOTRIMIN) 1 % cream Apply 1 application. topically daily. 02/04/21   [provider]  COD LIVER OIL PO Take 1 capsule by mouth daily.    [provider]  fluticasone (FLONASE) 50 MCG/ACT nasal spray Place 2 sprays into both nostrils daily. 03/27/21   [provider]  fluticasone-salmeterol (ADVAIR) 250-50 MCG/ACT AEPB  Inhale 1 puff into the lungs in the morning and at bedtime. 10/29/21 01/27/22  Lorin Glass, MD  hydrochlorothiazide (HYDRODIURIL) 25 MG tablet Take 25 mg by mouth daily. 04/03/15   [provider]  ketoconazole (NIZORAL) 2 % shampoo Apply 1 application. topically daily. 03/18/21   [provider]  losartan (COZAAR) 100 MG tablet Take 100 mg by mouth daily.      [provider]  metFORMIN (GLUCOPHAGE-XR) 500 MG 24 hr tablet Take 500 mg by mouth every other day. 03/27/21   [provider]  mometasone (ASMANEX) 220 MCG/ACT inhaler Inhale 2 puffs into the lungs 2 (two) times daily. 05/28/21   [provider]  Omega-3 Fatty Acids (FISH OIL) 1000 MG CAPS Take 1,000 mg by mouth 2 (two) times daily.    [provider]  pantoprazole (PROTONIX) 40 MG tablet Take 40 mg by mouth 2 (two) times daily.    [provider]  pioglitazone (ACTOS) 30 MG tablet Take 30 mg by mouth daily. 12/22/20   [provider]  predniSONE (DELTASONE) 10 MG tablet Take 6 tabs twice a day for 2 days, then take 4 tabs twice a day for 2 days, then take 4 tabs daily for 2 days, then take 3 tabs daily for 2 days, then take 2 tabs daily for 2 days, then take 1 tab daily for 2 days, then  STOP. 10/29/21   Lorin Glass, MD  tiotropium (SPIRIVA) 18 MCG inhalation capsule Place 1 capsule (18 mcg total) into inhaler and inhale daily. 11/16/10   [name], MD  Tiotropium Bromide-Olodaterol 2.5-2.5 MCG/ACT AERS Inhale 2 puffs into the lungs daily. 05/28/21   [provider]  urea (CARMOL) 20 % cream Apply 1 application topically daily as needed (rash).    [provider]  vitamin B-12 1000 MCG tablet Take 1 tablet (1,000 mcg total) by mouth daily. 10/30/21 01/28/22  Lorin Glass, MD  vitamin E 180 MG (400 UNITS) capsule Take 400 Units by mouth daily.    [provider]      Allergies    Ace inhibitors, Aspirin, and Latex    Review of Systems   Review of  Systems  Respiratory:  Positive for shortness of breath.     Physical Exam Updated Vital Signs BP (!) 152/133   Pulse 96   Temp 98.6 F (37 C)   Resp 15   SpO2 99%  Physical Exam Vitals and nursing note reviewed.  Constitutional:      Appearance: He is well-developed.  HENT:     Head: Normocephalic and atraumatic.  Eyes:     Pupils: Pupils are equal, round, and reactive to light.  Neck:     Vascular: No JVD.  Cardiovascular:     Rate and Rhythm: Normal rate and regular rhythm.     Heart sounds: No murmur heard.    No friction rub. No gallop.  Pulmonary:     Effort: No respiratory distress.     Breath sounds: Wheezing present.     Comments: Diminished breath sounds in all lung fields with end expiratory wheezes.  Tachypnea Abdominal:     General: There is no distension.     Tenderness: There is no abdominal tenderness. There is no guarding or rebound.  Musculoskeletal:        General: Normal range of motion.     Cervical back: Normal range of motion and neck supple.  Skin:    Coloration: Skin is not pale.     Findings: No rash.  Neurological:     Mental Status: He is alert and oriented to person, place, and time.  Psychiatric:        Behavior: Behavior normal.     ED Results / Procedures / Treatments   Labs (all labs ordered are listed, but only abnormal results are displayed) Labs Reviewed  BASIC METABOLIC PANEL - Abnormal; Notable for the following components:      Result Value   Chloride 96 (*)    CO2 36 (*)    Glucose, Bld 148 (*)    All other components within normal limits  CBC - Abnormal; Notable for the following components:   Hemoglobin 12.4 (*)    HCT 38.3 (*)    All other components within normal limits  BRAIN NATRIURETIC PEPTIDE - Abnormal; Notable for the following components:   B Natriuretic Peptide 418.1 (*)    All other components within normal limits  TROPONIN I (HIGH SENSITIVITY) - Abnormal; Notable for the following components:    Troponin I (High Sensitivity) 23 (*)    All other components within normal limits    EKG EKG Interpretation  Date/Time:  Wednesday January 27 2022 17:08:49 EDT Ventricular Rate:  100 PR Interval:  204 QRS Duration: 82 QT Interval:  362 QTC Calculation: 466 R Axis:   -6 Text Interpretation: Sinus rhythm with Premature supraventricular complexes Otherwise  normal ECG No significant change since last tracing Confirmed by Melene Plan [phone]) on 01/28/2022 1:17:42 AM  Radiology DG Chest 2 View  Result Date: 01/27/2022 CLINICAL DATA:  Short of breath EXAM: CHEST - 2 VIEW COMPARISON:  10/27/2021 FINDINGS: Frontal and lateral views of the chest demonstrate a stable cardiac silhouette. No acute airspace disease, effusion, or pneumothorax. No acute bony abnormality. IMPRESSION: 1. No acute intrathoracic process. Electronically Signed   By: Sharlet Salina M.D.   On: 01/27/2022 18:19    Procedures Procedures    Medications Ordered in ED Medications  ipratropium-albuterol (DUONEB) 0.5-2.5 (3) MG/3ML nebulizer solution 3 mL (3 mLs Nebulization Not Given 01/28/22 0155)  ipratropium-albuterol (DUONEB) 0.5-2.5 (3) MG/3ML nebulizer solution 3 mL (3 mLs Nebulization Given 01/28/22 0155)  magnesium sulfate IVPB 2 g 50 mL (0 g Intravenous Stopped 01/28/22 0241)  albuterol (PROVENTIL) (2.5 MG/3ML) 0.083% nebulizer solution 5 mg (5 mg Nebulization Given 01/28/22 0233)    ED Course/ Medical Decision Making/ A&P                           Medical Decision Making Amount and/or Complexity of Data Reviewed Labs: ordered. Radiology: ordered.  Risk Prescription drug management.   61 yo M with a chief complaints of difficulty breathing.  This been going on for about a month.  He thought it was due to not being on his oxygen.  He feels a bit better after being on 2 L while waiting to be seen.  He was given 3 DuoNebs back-to-back steroids and magnesium with some improvement.  Improved aeration still with tachypnea  prolonged expiratory effort and requiring oxygen.  Chest x-ray independently interpreted by me without focal infiltrate.  Troponin minimally elevated.  BNP just over 400.  Metabolic alkalosis.  Will discuss with medicine for admission.  CRITICAL CARE Performed by: Rae Roam   Total critical care time: 35 minutes  Critical care time was exclusive of separately billable procedures and treating other patients.  Critical care was necessary to treat or prevent imminent or life-threatening deterioration.  Critical care was time spent personally by me on the following activities: development of treatment plan with patient and/or surrogate as well as nursing, discussions with consultants, evaluation of patient's response to treatment, examination of patient, obtaining history from patient or surrogate, ordering and performing treatments and interventions, ordering and review of laboratory studies, ordering and review of radiographic studies, pulse oximetry and re-evaluation of patient's condition.  The patients results and plan were reviewed and discussed.   Any x-rays performed were independently reviewed by myself.   Differential diagnosis were considered with the presenting HPI.  Medications  ipratropium-albuterol (DUONEB) 0.5-2.5 (3) MG/3ML nebulizer solution 3 mL (3 mLs Nebulization Not Given 01/28/22 0155)  ipratropium-albuterol (DUONEB) 0.5-2.5 (3) MG/3ML nebulizer solution 3 mL (3 mLs Nebulization Given 01/28/22 0155)  magnesium sulfate IVPB 2 g 50 mL (0 g Intravenous Stopped 01/28/22 0241)  albuterol (PROVENTIL) (2.5 MG/3ML) 0.083% nebulizer solution 5 mg (5 mg Nebulization Given 01/28/22 0233)    Vitals:   01/28/22 0155 01/28/22 0200 01/28/22 0230 01/28/22 0233  BP:  (!) 162/101 (!) 152/133   Pulse:  64 96   Resp:   15   Temp:      TempSrc:      SpO2: 98% 96% 100% 99%    Final diagnoses:  COPD exacerbation (HCC)    Admission/ observation were discussed with the admitting  physician, patient and/or family  and they are comfortable with the plan.         Final Clinical Impression(s) / ED Diagnoses Final diagnoses:  COPD exacerbation [facility])    Rx / DC Orders ED Discharge Orders     None         Melene Plan, DO 01/28/22 7564

## 2022-01-28 NOTE — Care Management (Signed)
As per  patient's record Robert [name] continues to exhibit signs of acute on chronic respiratory failure with hypoxia and hypercapnia secondary to COPD.  The use of the NIV will treat patient's high PC02 levels (73.7 on 01/28/22 with elevated bicarbonate of 40.6) and can reduce risk of exacerbations and future hospitalizations when used at night and during the day.  All alternate devices [phone] and [id number]) have been considered and ruled out as volume requirements are not met by BiLevel devices.  An NIV with volume-targeted pressure support is necessary to prevent patient from life-threatening harm.  Interruption or failure to provide NIV would quickly lead to exacerbation of the patient's condition, hospital re-admission, and likely harm to the patient. Continued use is preferred.  Patient is able to protect their airways and clear secretions on their own. Once patient is medically cleared for discharge patient will be discharged home with NIV therapy with [facility]  Andree Coss  Specialist. Updated Dr. Curly Rim orders signed  Wendi Maya ED RN Care Manager [phone]

## 2022-01-28 NOTE — ED Notes (Signed)
Transport requested.  This Clinical research associate to escort.

## 2022-01-28 NOTE — ED Notes (Signed)
This Clinical research associate confirmed with RT that Pulmicort and Duoneb can be given together in the same nebulizer.

## 2022-01-28 NOTE — Evaluation (Signed)
Physical Therapy Evaluation Patient Details Name: Robert [name] MRN: [id number] DOB: 1961-03-25 Today's Date: 01/28/2022  History of Present Illness  Pt presented to ED 7/06 with copd exacerbation. PMH - DM, HTN, copd  Clinical Impression  Pt doing well with mobility and no further PT needed. Pt requiring supplemental O2 at rest and with mobility to maintain adequate oxygenation.        Recommendations for follow up therapy are one component of a multi-disciplinary discharge planning process, led by the attending physician.  Recommendations may be updated based on patient status, additional functional criteria and insurance authorization.  Follow Up Recommendations No PT follow up      Assistance Recommended at Discharge None  Patient can return home with the following       Equipment Recommendations None recommended by PT  Recommendations for Other Services       Functional Status Assessment Patient has not had a recent decline in their functional status     Precautions / Restrictions Precautions Precautions: Fall Precaution Comments: Watch O2 Restrictions Weight Bearing Restrictions: No      Mobility  Bed Mobility               General bed mobility comments: Sitting EOB    Transfers Overall transfer level: Independent Equipment used: None                    Ambulation/Gait Ambulation/Gait assistance: Independent Gait Distance (Feet): 250 Feet Assistive device: None Gait Pattern/deviations: WFL(Within Functional Limits) Gait velocity: adequate Gait velocity interpretation: >2.62 ft/sec, indicative of community ambulatory   General Gait Details: Steady gait  Stairs            Wheelchair Mobility    Modified Rankin (Stroke Patients Only)       Balance Overall balance assessment: No apparent balance deficits (not formally assessed)                                           Pertinent Vitals/Pain Pain  Assessment Pain Assessment: No/denies pain    Home Living Family/patient expects to be discharged to:: Private residence Living Arrangements: Spouse/significant other;Children Available Help at Discharge: Family;Available 24 hours/day Type of Home: House Home Access: Stairs to enter   [employer] of Steps: 1 steps Alternate Level Stairs-Number of Steps: full flight Home Layout: Two level;Bed/bath upstairs Home Equipment: None      Prior Function Prior Level of Function : Independent/Modified Independent;Driving             Mobility Comments: no AD       Hand Dominance   Dominant Hand: Right    Extremity/Trunk Assessment   Upper Extremity Assessment Upper Extremity Assessment: Defer to OT evaluation    Lower Extremity Assessment Lower Extremity Assessment: Overall WFL for tasks assessed       Communication   Communication: No difficulties  Cognition Arousal/Alertness: Awake/alert Behavior During Therapy: WFL for tasks assessed/performed Overall Cognitive Status: Within Functional Limits for tasks assessed                                          General Comments General comments (skin integrity, edema, etc.): Pt SpO2 97% on 4L of O2 at rest. Removed O2 with SpO2 dropping to 86% after 90 sec.  Amb on 3L of O2 with SpO2 90%. Returned to 96% at rest on 4L    Exercises     Assessment/Plan    PT Assessment Patient does not need any further PT services  PT Problem List         PT Treatment Interventions      PT Goals (Current goals can be found in the Care Plan section)  Acute Rehab PT Goals PT Goal Formulation: All assessment and education complete, DC therapy    Frequency       Co-evaluation               AM-PAC PT "6 Clicks" Mobility  Outcome Measure Help needed turning from your back to your side while in a flat bed without using bedrails?: None Help needed moving from lying on your back to sitting on the side  of a flat bed without using bedrails?: None Help needed moving to and from a bed to a chair (including a wheelchair)?: None Help needed standing up from a chair using your arms (e.g., wheelchair or bedside chair)?: None Help needed to walk in hospital room?: None Help needed climbing 3-5 steps with a railing? : None 6 Click Score: 24    End of Session Equipment Utilized During Treatment: Oxygen Activity Tolerance: Patient tolerated treatment well Patient left: in bed;with call bell/phone within reach (sitting EOB) Nurse Communication: Other (comment) (SpO2 documented) PT Visit Diagnosis: Other abnormalities of gait and mobility (R26.89)    Time: 1211-1222 PT Time Calculation (min) (ACUTE ONLY): 11 min   Charges:   PT Evaluation $PT Eval Low Complexity: 1 Low          [facility] PT Acute Rehabilitation Services Office [phone]   Angelina Ok [facility] 01/28/2022, 1:46 PM

## 2022-01-28 NOTE — Assessment & Plan Note (Signed)
1. COPD pathway 2. Prednisone 3. Scheduled LABA/LAMA/ inh steroid 4. PRN SABA 5. ABG pending, looks like he has chronic hypercapnea at least, and am concerned he may have acute hypercapnea. 1. If acute hypercapnea is indeed present (low pH), start BIPAP 6. Tele monitor 7. Cont pulse ox

## 2022-01-28 NOTE — Progress Notes (Signed)
[facility] PROGRESS NOTE   Robert [name] TWS:[id number] DOB: [birth year] DOA: 01/27/2022  PCP: Robert Helling, MD  Brief History/Interval Summary: 61 y.o. male with medical history significant of COPD, chronic O2 requirement of 2L, recent admit to hospital in April 2023 for COPD exacerbation.  About a month prior to admission somebody took him off of his home oxygen for unclear reason.  Since then patient has been short of breath which worsened on the day of admission.  Noted to have COPD exacerbation.  Was hospitalized for further management.  Consultants: None  Procedures: None    Subjective/Interval History: Patient mentioned that he is feeling a little better this morning compared to last night but still short of breath.  No chest pain nausea or vomiting.    Assessment/Plan:  COPD with acute exacerbation/acute on chronic respiratory failure with hypoxia and hypercapnia Patient was taken off of home oxygen about a month ago for reasons that are not clear.  As mentioned in H&P patient has been on oxygen for long.  Of time and is arranged through the [place]. Patient presented with shortness of breath and is thought to have COPD exacerbation.  VBG noted.  pH is 7.34 with a PCO2 of 73. Patient given nebulizer treatments and started on steroids.  Feels a little better this morning.  We will continue him on scheduled nebulizer treatments.  If he does not improve or worsens he will need BiPAP. Will likely need home oxygen at discharge. Chest x-ray without acute findings.  Patient noted to be on ceftriaxone.  Diabetes mellitus type 2, uncomplicated Anticipate more some worsening in his glycemic control due to steroids.  Continue SSI.  Oral medications currently on hold.  This includes metformin, Actos.  Essential hypertension Noted to be on amlodipine, HCTZ and losartan prior to admission. Resume amlodipine.  Obesity Estimated body mass index is 34.9 kg/m as calculated from the  following:   Height as of 01/19/21: 6\' 1"  (1.854 m).   Weight as of 01/19/21: 120 kg.   DVT Prophylaxis: Lovenox Code Status: Full code Family Communication: Discussed with patient Disposition Plan: Hopefully return home in improved  Status is: Observation The patient will require care spanning > 2 midnights and should be moved to inpatient because: Continued shortness of breath.      Medications: Scheduled:  budesonide (PULMICORT) nebulizer solution  0.5 mg Nebulization BID   enoxaparin (LOVENOX) injection  40 mg Subcutaneous Q24H   insulin aspart  0-15 Units Subcutaneous TID WC   insulin aspart  0-5 Units Subcutaneous QHS   ipratropium-albuterol  3 mL Nebulization QID   predniSONE  40 mg Oral Q breakfast   Continuous:  cefTRIAXone (ROCEPHIN)  IV Stopped (01/28/22 03/31/22)   0017  Antibiotics: Anti-infectives (From admission, onward)    Start     Dose/Rate Route Frequency Ordered Stop   01/28/22 0600  cefTRIAXone (ROCEPHIN) 1 g in sodium chloride 0.9 % 100 mL IVPB        1 g 200 mL/hr over 30 Minutes Intravenous Daily 01/28/22 0519 02/02/22 0759       Objective:  Vital Signs  Vitals:   01/28/22 0730 01/28/22 0805 01/28/22 0905 01/28/22 0930  BP: (!) 156/107 (!) 144/123 (!) 148/102 (!) 133/111  Pulse: 100 (!) 55 (!) 47 (!) 59  Resp: (!) 27 (!) 22 (!) 28 18  Temp:      TempSrc:      SpO2: 96% 98% 98% 100%   No intake or output data in  the 24 hours ending 01/28/22 0941 There were no vitals filed for this visit.  General appearance: Awake alert.  In no distress Resp: Noted to be tachypneic.  No use of accessory muscles.  Diminished air entry bilaterally.  Few scattered wheezes.  No rhonchi. Cardio: S1-S2 is normal regular.  No S3-S4.  No rubs murmurs or bruit GI: Abdomen is soft.  Nontender nondistended.  Bowel sounds are present normal.  No masses organomegaly Extremities: Mild edema bilateral lower extremities Neurologic: Alert and oriented x3.  No  focal neurological deficits.    Lab Results:  Data Reviewed: I have personally reviewed following labs and reports of the imaging studies  CBC: Recent Labs  Lab 01/27/22 1734 01/28/22 0533 01/28/22 0545  WBC 6.6  --   --   HGB 12.4* 12.9* 12.9*  HCT 38.3* 38.0* 38.0*  MCV 89.5  --   --   PLT 381  --   --     Basic Metabolic Panel: Recent Labs  Lab 01/27/22 1734 01/28/22 0533 01/28/22 0545  NA 141 138 138  K 4.2 3.1* 2.9*  CL 96*  --   --   CO2 36*  --   --   GLUCOSE 148*  --   --   BUN 9  --   --   CREATININE 0.82  --   --   CALCIUM 9.2  --   --     GFR: CrCl cannot be calculated (Unknown ideal weight.).   CBG: Recent Labs  Lab 01/28/22 0725  GLUCAP 187*     Radiology Studies: DG Chest 2 View  Result Date: 01/27/2022 CLINICAL DATA:  Short of breath EXAM: CHEST - 2 VIEW COMPARISON:  10/27/2021 FINDINGS: Frontal and lateral views of the chest demonstrate a stable cardiac silhouette. No acute airspace disease, effusion, or pneumothorax. No acute bony abnormality. IMPRESSION: 1. No acute intrathoracic process. Electronically Signed   By: Robert [name] M.D.   On: 01/27/2022 18:19       LOS: 0 days   Robert [name] Robert [name]  [facility] Pager on [email or web address]  01/28/2022, 9:41 AM

## 2022-01-28 NOTE — Progress Notes (Signed)
SATURATION QUALIFICATIONS: (This note is used to comply with regulatory documentation for home oxygen)  Patient Saturations on Room Air at Rest = 86%  Patient Saturations on Room Air while Ambulating = NT due to low SpO2 at rest on RA  Patient Saturations on 3 Liters of oxygen while Ambulating = 90%  Please briefly explain why patient needs home oxygen:Unable to maintain adequate level oxygenation with activity without supplemental O2  [facility] PT Acute Rehabilitation Services Office [phone]

## 2022-01-28 NOTE — H&P (Signed)
History and Physical    Patient: Robert [name] NIO:[id number] DOB: 28-Jan-1961 DOA: 01/27/2022 DOS: the patient was seen and examined on 01/28/2022 PCP: [name], MD  Patient coming from: Home  Chief Complaint:  Chief Complaint  Patient presents with   Shortness of Breath   HPI: Robert [name] is a 61 y.o. male with medical history significant of COPD, chronic O2 requirement of 2L, recent admit to hospital in April 2023 for COPD exacerbation.  Left on 3L.  About 1 month ago, someone took him off of his chronic home O2 for unclear reasons: told him "he no longer needed it".  Since that time he has been SOB.  This got worse today and he presents to the ED for SOB worsening.  Taking home nebs without much improvement.  On my review, looks like hes been on home O2 for a number of years: at least since 2021 when he last saw pulm.  Also looks like home O2 need was most recently confirmed by the [place] system (who also should be his Payee as far as the DME goes) as of Sep 15, 2021 (at [facility]) with a 6 min walk test.  (Desatted to 84% on RA with walking, had to terminate test early, etc).    Review of Systems: As mentioned in the history of present illness. All other systems reviewed and are negative. Past Medical History:  Diagnosis Date   ACE-inhibitor cough    Acute respiratory failure [facility]) January 2015   2nd to AECOPD, required intubation   COPD with emphysema (HCC)        Hiatal hernia    Hyperlipidemia    Hypertension    Nephrolithiasis    Nocturnal hypoxemia due to emphysema [facility])    Pulmonary nodule, right 12/10/2010   Seasonal allergies    Past Surgical History:  Procedure Laterality Date   KNEE ARTHROSCOPY Right 2005   x 2 -had one in the service.   Social History:  reports that he quit smoking about 30 years ago. His smoking use included cigarettes. He has a 24.00 pack-year smoking history. He has never used smokeless tobacco. He reports that he does not drink  alcohol and does not use drugs.  Allergies  Allergen Reactions   Ace Inhibitors Cough   Aspirin Other (See Comments)    Pt does not remember reaction   Latex Other (See Comments)    Pt does not remember reaction    Family History  Problem Relation Age of Onset   Pancreatic cancer Father    Heart disease Sister     Prior to Admission medications   Medication Sig Start Date End Date Taking? Authorizing Provider  acetaminophen (TYLENOL) 500 MG tablet Take 1,500 mg by mouth every 6 (six) hours as needed for mild pain.    [provider]  albuterol (VENTOLIN HFA) 108 (90 Base) MCG/ACT inhaler Inhale 2 puffs into the lungs every 6 (six) hours as needed for wheezing or shortness of breath. 10/29/21   Lorin Glass, MD  amLODipine (NORVASC) 10 MG tablet Take 5 mg by mouth daily. 03/27/21   [provider]  atorvastatin (LIPITOR) 80 MG tablet Take 80 mg by mouth daily.    [provider]  cetirizine (ZYRTEC) 10 MG tablet Take 10 mg by mouth daily.      [provider]  Cholecalciferol 25 MCG (1000 UT) tablet Take 2,000 Units by mouth daily. 03/27/21   [provider]  clotrimazole (LOTRIMIN) 1 % cream Apply  1 application. topically daily. 02/04/21   [provider]  COD LIVER OIL PO Take 1 capsule by mouth daily.    [provider]  fluticasone (FLONASE) 50 MCG/ACT nasal spray Place 2 sprays into both nostrils daily. 03/27/21   [provider]  fluticasone-salmeterol (ADVAIR) 250-50 MCG/ACT AEPB Inhale 1 puff into the lungs in the morning and at bedtime. 10/29/21 01/27/22  Lorin Glass, MD  hydrochlorothiazide (HYDRODIURIL) 25 MG tablet Take 25 mg by mouth daily. 04/03/15   [provider]  ketoconazole (NIZORAL) 2 % shampoo Apply 1 application. topically daily. 03/18/21   [provider]  losartan (COZAAR) 100 MG tablet Take 100 mg by mouth daily.      [provider]  metFORMIN (GLUCOPHAGE-XR) 500 MG 24 hr  tablet Take 500 mg by mouth every other day. 03/27/21   [provider]  mometasone (ASMANEX) 220 MCG/ACT inhaler Inhale 2 puffs into the lungs 2 (two) times daily. 05/28/21   [provider]  Omega-3 Fatty Acids (FISH OIL) 1000 MG CAPS Take 1,000 mg by mouth 2 (two) times daily.    [provider]  pantoprazole (PROTONIX) 40 MG tablet Take 40 mg by mouth 2 (two) times daily.    [provider]  pioglitazone (ACTOS) 30 MG tablet Take 30 mg by mouth daily. 12/22/20   [provider]  predniSONE (DELTASONE) 10 MG tablet Take 6 tabs twice a day for 2 days, then take 4 tabs twice a day for 2 days, then take 4 tabs daily for 2 days, then take 3 tabs daily for 2 days, then take 2 tabs daily for 2 days, then take 1 tab daily for 2 days, then STOP. 10/29/21   Dahal, Melina Schools, MD  tiotropium (SPIRIVA) 18 MCG inhalation capsule Place 1 capsule (18 mcg total) into inhaler and inhale daily. 11/16/10   [name], MD  Tiotropium Bromide-Olodaterol 2.5-2.5 MCG/ACT AERS Inhale 2 puffs into the lungs daily. 05/28/21   [provider]  urea (CARMOL) 20 % cream Apply 1 application topically daily as needed (rash).    [provider]  vitamin B-12 1000 MCG tablet Take 1 tablet (1,000 mcg total) by mouth daily. 10/30/21 01/28/22  Lorin Glass, MD  vitamin E 180 MG (400 UNITS) capsule Take 400 Units by mouth daily.    [provider]    Physical Exam: Vitals:   01/28/22 0315 01/28/22 0400 01/28/22 0430 01/28/22 0500  BP: (!) 145/105 (!) 158/93 (!) 159/102 (!) 166/108  Pulse: 94 95 99 88  Resp: (!) 34 (!) 26 (!) 28 (!) 23  Temp:      TempSrc:      SpO2: 100% 97% 98% 99%   Constitutional: NAD, calm, comfortable, somewhat sleepy Eyes: PERRL, lids and conjunctivae normal ENMT: Mucous membranes are moist. Posterior pharynx clear of any exudate or lesions.Normal dentition.  Neck: normal, supple, no masses, no thyromegaly Respiratory: Diffuse wheezing  present Cardiovascular: Regular rate and rhythm, no murmurs / rubs / gallops. No extremity edema. 2+ pedal pulses. No carotid bruits.  Abdomen: no tenderness, no masses palpated. No hepatosplenomegaly. Bowel sounds positive.  Musculoskeletal: no clubbing / cyanosis. No joint deformity upper and lower extremities. Good ROM, no contractures. Normal muscle tone.  Skin: no rashes, lesions, ulcers. No induration Neurologic: CN 2-12 grossly intact. Sensation intact, DTR normal. Strength 5/5 in all 4.  Psychiatric: Normal judgment and insight. Alert and oriented x 3. Normal mood.   Data Reviewed:    CXR without  acute findings.     Latest Ref Rng & Units 01/27/2022    5:34 PM 10/29/2021    2:52 AM 10/28/2021    2:43 AM  BMP  Glucose 70 - 99 mg/dL 502  72  774   BUN 8 - 23 mg/dL 9  14  12    Creatinine 0.61 - 1.24 mg/dL  1.28  7.86   Sodium 135 - 145 mmol/L 141  139  139   Potassium 3.5 - 5.1 mmol/L 4.2  3.7  4.4   Chloride 98 - 111 mmol/L 96  99  101   CO2 22 - 32 mmol/L 36  30  30   Calcium 8.9 - 10.3 mg/dL 9.2  9.1  8.9    CBC    Component Value Date/Time   WBC 6.6 01/27/2022 1734   RBC 4.28 01/27/2022 1734   HGB 12.4 (L) 01/27/2022 1734   HCT 38.3 (L) 01/27/2022 1734   PLT 381 01/27/2022 1734   MCV 89.5 01/27/2022 1734   MCH 29.0 01/27/2022 1734   MCHC 32.4 01/27/2022 1734   RDW 14.6 01/27/2022 1734   LYMPHSABS 0.7 10/29/2021 0252   MONOABS 0.4 10/29/2021 0252   EOSABS 0.0 10/29/2021 0252   BASOSABS 0.0 10/29/2021 0252     Assessment and Plan: * Acute exacerbation of chronic obstructive pulmonary disease (COPD) (HCC) COPD pathway Prednisone Scheduled LABA/LAMA/ inh steroid PRN SABA ABG pending, looks like he has chronic hypercapnea at least, and am concerned he may have acute hypercapnea. If acute hypercapnea is indeed present (low pH), start BIPAP Tele monitor Cont pulse ox  Chronic respiratory failure with hypoxia and hypercapnia (HCC) Patient has chronic  respiratory failure with hypoxia.  That is they require home oxygen at baseline. Patient has required home O2 2L for a number of years for his emphysema according to his chart.  Most recently it appears that this was confirmed by [facility] on 6 min walk testing Sep 15, 2021.  [facility]: please see [facility] documentation [facility] from Sep 15, 2021 if any questions about this!  Since that time he has been admitted (now twice) for COPD exacerbation and hypoxia.  I am not sure why his home O2 was removed this past month or who ordered it removed, but it sure sounds likely that he still needs it!  Will get SW involved to see if they can sort out the home O2 situation.  Non-insulin dependent type 2 diabetes mellitus (HCC) Control likely to worsen with steroid use for treatment of COPD exacerbation today. Hold home PO meds Mod scale SSI AC/HS for the moment, may need to increase.  HTN (hypertension) Home med rec pending. Plan to continue home BP meds when completed.      Advance Care Planning:   Code Status: Full Code  Consults: None  Family Communication: No family in room  Severity of Illness: The appropriate patient status for this patient is OBSERVATION. Observation status is judged to be reasonable and necessary in order to provide the required intensity of service to ensure the patient's safety. The patient's presenting symptoms, physical exam findings, and initial radiographic and laboratory data in the context of their medical condition is felt to place them at decreased risk for further clinical deterioration. Furthermore, it is anticipated that the patient will be medically stable for discharge from the hospital within 2 midnights of admission.   Author: Sep 17, 2021., DO 01/28/2022  5:40 AM  For on call review [email or web address].

## 2022-01-28 NOTE — ED Notes (Signed)
OT at bedside. 

## 2022-01-28 NOTE — Evaluation (Signed)
Occupational Therapy Evaluation Patient Details Name: Robert [name] MRN: [id number] DOB: 1961/07/18 Today's Date: 01/28/2022   History of Present Illness Pt presented to ED 7/06 with copd exacerbation. PMH - DM, HTN, copd   Clinical Impression   Pt admitted for concerns listed above. PTA Pt reported that he was independent with all ADL's and IADL's, including cooking/cleaning. At this time, he is requiring increased O2 to maintain his sats above 90%. He continues to be able to complete ADL's and functional mobility with no assist. OT provided education on energy conservation. Pt has no further OT needs. Acute OT will sign off.       Recommendations for follow up therapy are one component of a multi-disciplinary discharge planning process, led by the attending physician.  Recommendations may be updated based on patient status, additional functional criteria and insurance authorization.   Follow Up Recommendations  No OT follow up    Assistance Recommended at Discharge None  Patient can return home with the following      Functional Status Assessment  Patient has had a recent decline in their functional status and demonstrates the ability to make significant improvements in function in a reasonable and predictable amount of time.  Equipment Recommendations  None recommended by OT    Recommendations for Other Services       Precautions / Restrictions Precautions Precautions: Fall Precaution Comments: Watch O2 Restrictions Weight Bearing Restrictions: No      Mobility Bed Mobility               General bed mobility comments: Sitting EOB    Transfers Overall transfer level: Independent Equipment used: None                      Balance Overall balance assessment: No apparent balance deficits (not formally assessed)                                         ADL either performed or assessed with clinical judgement   ADL Overall ADL's : At  baseline;Modified independent                                       General ADL Comments: Able to complete ADL's with noassist     Vision Baseline Vision/History: 0 No visual deficits Ability to See in Adequate Light: 0 Adequate Patient Visual Report: No change from baseline Vision Assessment?: No apparent visual deficits     Perception     Praxis      Pertinent Vitals/Pain Pain Assessment Pain Assessment: No/denies pain     Hand Dominance Right   Extremity/Trunk Assessment Upper Extremity Assessment Upper Extremity Assessment: Overall WFL for tasks assessed   Lower Extremity Assessment Lower Extremity Assessment: Overall WFL for tasks assessed   Cervical / Trunk Assessment Cervical / Trunk Assessment: Normal   Communication Communication Communication: No difficulties   Cognition Arousal/Alertness: Awake/alert Behavior During Therapy: WFL for tasks assessed/performed Overall Cognitive Status: Within Functional Limits for tasks assessed                                       General Comments  O2 remained above 90% on 4L with functional mobility.  Exercises     Shoulder Instructions      Home Living Family/patient expects to be discharged to:: Private residence Living Arrangements: Spouse/significant other;Children Available Help at Discharge: Family;Available 24 hours/day Type of Home: House Home Access: Stairs to enter [employer] of Steps: 1 steps   Home Layout: Two level;Bed/bath upstairs Alternate Level Stairs-Number of Steps: full flight Alternate Level Stairs-Rails: Left Bathroom Shower/Tub: Producer, television/film/video: Standard     Home Equipment: None          Prior Functioning/Environment Prior Level of Function : Independent/Modified Independent;Driving             Mobility Comments: no AD          OT Problem List: Decreased strength;Decreased activity tolerance;Impaired  balance (sitting and/or standing);Cardiopulmonary status limiting activity      OT Treatment/Interventions: Self-care/ADL training;Therapeutic exercise;Energy conservation;DME and/or AE instruction;Therapeutic activities;Patient/family education;Balance training    OT Goals(Current goals can be found in the care plan section) Acute Rehab OT Goals Patient Stated Goal: To breath better OT Goal Formulation: With patient Time For Goal Achievement: 01/28/22 Potential to Achieve Goals: Good  OT Frequency: Min 2X/week    Co-evaluation              AM-PAC OT "6 Clicks" Daily Activity     Outcome Measure Help from another person eating meals?: None Help from another person taking care of personal grooming?: None Help from another person toileting, which includes using toliet, bedpan, or urinal?: None Help from another person bathing (including washing, rinsing, drying)?: None Help from another person to put on and taking off regular upper body clothing?: None Help from another person to put on and taking off regular lower body clothing?: None 6 Click Score: 24   End of Session Equipment Utilized During Treatment: Oxygen Nurse Communication: Mobility status  Activity Tolerance: Patient tolerated treatment well Patient left: in bed;with call bell/phone within reach  OT Visit Diagnosis: Unsteadiness on feet (R26.81);Other abnormalities of gait and mobility (R26.89);Muscle weakness (generalized) (M62.81)                Time: 2158-7276 OT Time Calculation (min): 23 min Charges:  OT General Charges $OT Visit: 1 Visit OT Evaluation $OT Eval Moderate Complexity: 1 Mod OT Treatments $Self Care/Home Management : 8-22 mins  Deeksha Cotrell H., OTR/L Acute Rehabilitation  Tayon Parekh Elane Raysean Graumann 01/28/2022, 6:50 PM

## 2022-01-28 NOTE — Assessment & Plan Note (Addendum)
Patient has chronic respiratory failure with hypoxia.  That is they require home oxygen at baseline. Patient has required home O2 2L for a number of years for his emphysema according to his chart.  Most recently it appears that this was confirmed by [facility] on 6 min walk testing Sep 15, 2021.  To [facility]: please see [facility] documentation ([facility] own [facility]) from Sep 15, 2021 if any questions about this!  Since that time he has been admitted (now twice) for COPD exacerbation and hypoxia.  I am not sure why his home O2 was removed this past month or who ordered it removed, but it sure sounds likely that he still needs it!  Will get SW involved to see if they can sort out the home O2 situation.

## 2022-01-29 LAB — BASIC METABOLIC PANEL
Anion gap: 8 (ref 5–15)
BUN: 12 mg/dL (ref 8–23)
CO2: 42 mmol/L — ABNORMAL HIGH (ref 22–32)
Calcium: 8.8 mg/dL — ABNORMAL LOW (ref 8.9–10.3)
Chloride: 93 mmol/L — ABNORMAL LOW (ref 98–111)
Creatinine, Ser: 1.14 mg/dL (ref 0.61–1.24)
GFR, Estimated: >60 mL/min (ref >60)
Glucose, Bld: 180 mg/dL — ABNORMAL HIGH (ref 70–99)
Potassium: 4.1 mmol/L (ref 3.5–5.1)
Sodium: 143 mmol/L (ref 135–145)

## 2022-01-29 LAB — CBC
HCT: 37.6 % — ABNORMAL LOW (ref 39.0–52.0)
Hemoglobin: 11.9 g/dL — ABNORMAL LOW (ref 13.0–17.0)
MCH: 29.3 pg (ref 26.0–34.0)
MCHC: 31.6 g/dL (ref 30.0–36.0)
MCV: 92.6 fL (ref 80.0–100.0)
Platelets: 347 10*3/uL (ref 150–400)
RBC: 4.06 MIL/uL — ABNORMAL LOW (ref 4.22–5.81)
RDW: 15 % (ref 11.5–15.5)
WBC: 5.7 10*3/uL (ref 4.0–10.5)
nRBC: 0 % (ref 0.0–0.2)

## 2022-01-29 LAB — GLUCOSE, CAPILLARY
Glucose-Capillary: 170 mg/dL — ABNORMAL HIGH (ref 70–99)
Glucose-Capillary: 200 mg/dL — ABNORMAL HIGH (ref 70–99)
Glucose-Capillary: 261 mg/dL — ABNORMAL HIGH (ref 70–99)
Glucose-Capillary: 185 mg/dL — ABNORMAL HIGH (ref 70–99)

## 2022-01-29 LAB — MAGNESIUM: Magnesium: 2.1 mg/dL (ref 1.7–2.4)

## 2022-01-29 NOTE — Plan of Care (Signed)
  Problem: Skin Integrity: Goal: Risk for impaired skin integrity will decrease Outcome: Progressing   Problem: Education: Goal: Knowledge of General Education information will improve Description: Including pain rating scale, medication(s)/side effects and non-pharmacologic comfort measures Outcome: Progressing   Problem: Pain Managment: Goal: General experience of comfort will improve Outcome: Progressing   Problem: Safety: Goal: Ability to remain free from injury will improve Outcome: Progressing   Problem: Skin Integrity: Goal: Risk for impaired skin integrity will decrease Outcome: Progressing

## 2022-01-29 NOTE — Progress Notes (Signed)
Nutrition Brief Note  Consult received from COPD protocol.  Pt reports no recent weight changes and good appetite. Discussed importance of nutrition for COPD. Provided handouts attached to AVS.   Wt Readings from Last 15 Encounters:  01/28/22 110.6 kg  01/19/21 120 kg  11/13/19 112.9 kg  09/19/19 112.2 kg  06/01/16 109.3 kg  12/16/15 116 kg  06/09/15 117.9 kg  04/18/15 115.2 kg  12/09/14 117.5 kg  05/24/14 118.3 kg  10/01/13 115.7 kg  08/20/13 112.5 kg  08/06/13 109.8 kg  07/21/12 117.9 kg  04/27/12 115.7 kg    Body mass index is 39.35 kg/m. Patient meets criteria for obesity based on current BMI.   Current diet order is Carb Modified Diet, patient is consuming approximately 100% of meals at this time. Labs and medications reviewed.   No nutrition interventions warranted at this time. If nutrition issues arise, please consult RD.   Cammy Copa., RD, LDN, CNSC See AMiON for contact information

## 2022-01-29 NOTE — Progress Notes (Signed)
[facility] PROGRESS NOTE   Robert [name] KGY:[id number] DOB: [birth year] DOA: 01/27/2022  PCP: [name], MD  Brief History/Interval Summary: 61 y.o. male with medical history significant of COPD, chronic O2 requirement of 2L, recent admit to hospital in April 2023 for COPD exacerbation.  About a month prior to admission somebody took him off of his home oxygen for unclear reason.  Since then patient has been short of breath which worsened on the day of admission.  Noted to have COPD exacerbation.  Was hospitalized for further management.  Consultants: None  Procedures: None    Subjective/Interval History: Patient mentioned that he is feeling better today compared to yesterday.  Still not fully back to his baseline.  Still gets short of breath with minimal exertion.  Denies any nausea vomiting.  No chest pain.     Assessment/Plan:  COPD with acute exacerbation/acute on chronic respiratory failure with hypoxia and hypercapnia Patient was taken off of home oxygen about a month ago for reasons that are not clear.  As mentioned in H&P patient has been on oxygen for long.  Of time and is arranged through the [place]. Patient presented with shortness of breath and is thought to have COPD exacerbation.  VBG noted.  pH is 7.34 with a PCO2 of 73. Patient given nebulizer treatments and started on steroids.   Symptoms have improved though still feels short of breath with exertion.  We will increase the dose of his steroids.  Hypoxia noted when he was off of oxygen.  He will need home oxygen which has been ordered.  His CO2 level was also noted to be elevated.  He might be a good candidate for trilogy at home.  This is being pursued by case management and home health agency. Noted to be on ceftriaxone which will be continued.  Chest x-ray did not show any acute findings.    Diabetes mellitus type 2, uncomplicated Oral medications which she takes at home are currently on hold.  This includes  metformin and Actos.  Continue SSI.  Anticipate some worsening in his glucose levels with steroids.  HbA1c was 5.8 in April.    Essential hypertension Noted to be on amlodipine, HCTZ and losartan prior to admission. Currently on amlodipine.  Blood pressure is reasonably well controlled.  Obesity Estimated body mass index is 39.35 kg/m as calculated from the following:   Height as of this encounter: 5\' 6"  (1.676 m).   Weight as of this encounter: 110.6 kg.   DVT Prophylaxis: Lovenox Code Status: Full code Family Communication: Discussed with patient Disposition Plan: Hopefully return home in 24 to 48 hours.  Status is: Inpatient Remains inpatient appropriate because: Acute respiratory failure with hypoxia and hypercapnia     Medications: Scheduled:  amLODipine  5 mg Oral Daily   atorvastatin  80 mg Oral Daily   budesonide (PULMICORT) nebulizer solution  0.5 mg Nebulization BID   enoxaparin (LOVENOX) injection  40 mg Subcutaneous Q24H   insulin aspart  0-15 Units Subcutaneous TID WC   insulin aspart  0-5 Units Subcutaneous QHS   loratadine  10 mg Oral Daily   pantoprazole  40 mg Oral BID   predniSONE  40 mg Oral Q breakfast   Continuous:  cefTRIAXone (ROCEPHIN)  IV 1 g (01/29/22 0824)   04/01/22, albuterol, ondansetron (ZOFRAN) IV  Antibiotics: Anti-infectives (From admission, onward)    Start     Dose/Rate Route Frequency Ordered Stop   01/28/22 0600  cefTRIAXone (ROCEPHIN) 1 g in  sodium chloride 0.9 % 100 mL IVPB        1 g 200 mL/hr over 30 Minutes Intravenous Daily 01/28/22 0519 02/02/22 0759       Objective:  Vital Signs  Vitals:   01/29/22 0454 01/29/22 0721 01/29/22 0800 01/29/22 0851  BP: 128/84 118/90    Pulse: 94 93    Resp: 20 (!) 22    Temp: 97.6 F (36.4 C) 98.7 F (37.1 C)    TempSrc: Oral Oral    SpO2: 99% 96% 96% 100%  Weight:      Height:        Intake/Output Summary (Last 24 hours) at 01/29/2022 0938 Last data filed at  01/29/2022 0900 Gross per 24 hour  Intake 240 ml  Output 1525 ml  Net -1285 ml   Filed Weights   01/28/22 1910  Weight: 110.6 kg   General appearance: Awake alert.  In no distress Resp: Improved effort compared to yesterday though still tachypneic.  No use of accessory muscles.  Improved air entry bilaterally.  Scattered wheezing appreciated. Cardio: S1-S2 is normal regular.  No S3-S4.  No rubs murmurs or bruit GI: Abdomen is soft.  Nontender nondistended.  Bowel sounds are present normal.  No masses organomegaly Extremities: No edema.  Full range of motion of lower extremities. Neurologic: Alert and oriented x3.  No focal neurological deficits.     Lab Results:  Data Reviewed: I have personally reviewed following labs and reports of the imaging studies  CBC: Recent Labs  Lab 01/27/22 1734 01/28/22 0533 01/28/22 0545 01/29/22 0124  WBC 6.6  --   --  5.7  HGB 12.4* 12.9* 12.9* 11.9*  HCT 38.3* 38.0* 38.0* 37.6*  MCV 89.5  --   --  92.6  PLT 381  --   --  347     Basic Metabolic Panel: Recent Labs  Lab 01/27/22 1734 01/28/22 0533 01/28/22 0545 01/29/22 0124  NA 141 138 138 143  K 4.2 3.1* 2.9* 4.1  CL 96*  --   --  93*  CO2 36*  --   --  42*  GLUCOSE 148*  --   --  180*  BUN 9  --   --  12  CREATININE 0.82  --   --  1.14  CALCIUM 9.2  --   --  8.8*  MG  --   --   --  2.1     GFR: Estimated Creatinine Clearance: 79.4 mL/min (by C-G formula based on SCr of 1.14 mg/dL).   CBG: Recent Labs  Lab 01/28/22 1145 01/28/22 1637 01/28/22 1803 01/28/22 2101 01/29/22 0719  GLUCAP 255* 229* 224* 177* 170*      Radiology Studies: DG Chest 2 View  Result Date: 01/27/2022 CLINICAL DATA:  Short of breath EXAM: CHEST - 2 VIEW COMPARISON:  10/27/2021 FINDINGS: Frontal and lateral views of the chest demonstrate a stable cardiac silhouette. No acute airspace disease, effusion, or pneumothorax. No acute bony abnormality. IMPRESSION: 1. No acute intrathoracic process.  Electronically Signed   By: Sharlet Salina M.D.   On: 01/27/2022 18:19       LOS: 1 day   Hansford Hirt  [facility] Pager on [email or web address]  01/29/2022, 9:38 AM

## 2022-01-29 NOTE — Progress Notes (Signed)
NCM made aware by [facility] pt declined home NIV because of high deductible through [employer], $ 4,000.00.  Pt affiliated with the [facility].  Any SW need please call, [phone], [phone]. NCM called [facility] [phone]) and call was transferred to [phone]. [phone] by Diplomatic [employer] operational officer. Voice message left. NCM communicated  DME need for home oxygen and NIV.  Per secretary NCM need to f/u on Monday regarding DME need, no one available to assist.   Pt states already has BIPAP and CPAP @ home. States having problems with CPAP mask. TOC  [facility] to address [facility] needs...  Gae Gallop RN,BSN,CM [phone]

## 2022-01-30 LAB — GLUCOSE, CAPILLARY
Glucose-Capillary: 164 mg/dL — ABNORMAL HIGH (ref 70–99)
Glucose-Capillary: 225 mg/dL — ABNORMAL HIGH (ref 70–99)

## 2022-01-30 MED ORDER — IPRATROPIUM-ALBUTEROL 0.5-2.5 (3) MG/3ML IN SOLN
3.0000 mL | Freq: Four times a day (QID) | RESPIRATORY_TRACT | Status: DC
  Administered 2022-01-30 (×2): 3 mL via RESPIRATORY_TRACT
  Filled 2022-01-30 (×2): qty 3

## 2022-01-30 MED ORDER — IPRATROPIUM-ALBUTEROL 0.5-2.5 (3) MG/3ML IN SOLN: 3.0000 mL | RESPIRATORY_TRACT | 1 refills | Status: DC | PRN

## 2022-01-30 MED ORDER — PREDNISONE 20 MG PO TABS: 40.0000 mg | ORAL_TABLET | Freq: Two times a day (BID) | ORAL | Status: DC

## 2022-01-30 MED ORDER — PREDNISONE 20 MG PO TABS: ORAL_TABLET | ORAL | 0 refills | Status: DC

## 2022-01-30 MED ORDER — CYANOCOBALAMIN 1000 MCG PO TABS: 1000.0000 ug | ORAL_TABLET | Freq: Every day | ORAL | 2 refills | Status: AC

## 2022-01-30 MED ORDER — LOSARTAN POTASSIUM 50 MG PO TABS
100.0000 mg | ORAL_TABLET | Freq: Every day | ORAL | Status: DC
Start: 2022-01-30 — End: 2022-01-30
  Administered 2022-01-30: 100 mg via ORAL
  Filled 2022-01-30: qty 2

## 2022-01-30 MED ORDER — DOXYCYCLINE HYCLATE 100 MG PO TABS: 100.0000 mg | ORAL_TABLET | Freq: Two times a day (BID) | ORAL | 0 refills | Status: AC

## 2022-01-30 NOTE — Discharge Summary (Signed)
[facility]  Physician Discharge Summary   Patient ID: Robert [name] MRN: [id number] DOB/AGE: [birth year] 61 y.o.  Admit date: 01/27/2022 Discharge date: 01/30/2022    PCP: [name], MD  DISCHARGE DIAGNOSES:  Principal Problem:   Acute exacerbation of chronic obstructive pulmonary disease (COPD) (HCC) Active Problems:   Chronic respiratory failure with hypoxia and hypercapnia (HCC)   Non-insulin dependent type 2 diabetes mellitus (HCC)   HTN (hypertension)   COPD with acute exacerbation (HCC)   RECOMMENDATIONS FOR OUTPATIENT FOLLOW UP: Patient to follow-up with his providers at the [place].   Home Health: None Equipment/Devices: Home oxygen.  Patient declined trilogy ventilator.  CODE STATUS: Full code  DISCHARGE CONDITION: fair  Diet recommendation: As before  INITIAL HISTORY:  61 y.o. male with medical history significant of COPD, chronic O2 requirement of 2L, recent admit to hospital in April 2023 for COPD exacerbation.  About a month prior to admission somebody took him off of his home oxygen for unclear reason.  Since then patient has been short of breath which worsened on the day of admission.  Noted to have COPD exacerbation.  Was hospitalized for further management.    HOSPITAL COURSE:   COPD with acute exacerbation/acute on chronic respiratory failure with hypoxia and hypercapnia Patient on home oxygen.  Apparently does not have a portable tank at home anymore.  However does have oxygen machine at the house. Patient presented with shortness of breath and is thought to have COPD exacerbation.  VBG noted.  pH is 7.34 with a PCO2 of 73. Patient given nebulizer treatments and started on steroids.   Patient apparently does have BiPAP at home.  Trilogy ventilator was considered however due to insurance issues and high co-pay patient declines. Case manager has managed to arrange home oxygen after patient and wife are willing to pay the co-pay. However patient at  the same time has not shown much improvement.  Dose of steroid was increased. Patient subsequently reached out saying that he wanted to go home since oxygen was arranged.   Subsequently he was discharged.     Diabetes mellitus type 2, uncomplicated/hyperglycemia due to steroid Essential hypertension    Obesity Estimated body mass index is 39.35 kg/m as calculated from the following:   Height as of this encounter: 5\' 6"  (1.676 m).   Weight as of this encounter: 110.6 kg.    Wants to go home.  He was told to return to the hospital if his symptoms get worse.  Okay for discharge home today.   PERTINENT LABS:  The results of significant diagnostics from this hospitalization (including imaging, microbiology, ancillary and laboratory) are listed below for reference.    Microbiology: Recent Results (from the past 240 hour(s))  Resp Panel by RT-PCR (Flu A&B, Covid) Anterior Nasal Swab     Status: None   Collection Time: 01/28/22  1:00 PM   Specimen: Anterior Nasal Swab  Result Value Ref Range Status   SARS Coronavirus 2 by RT PCR NEGATIVE NEGATIVE Final    Comment: (NOTE) SARS-CoV-2 target nucleic acids are NOT DETECTED.  The SARS-CoV-2 RNA is generally detectable in upper respiratory specimens during the acute phase of infection. The lowest concentration of SARS-CoV-2 viral copies this assay can detect is 138 copies/mL. A negative result does not preclude SARS-Cov-2 infection and should not be used as the sole basis for treatment or other patient management decisions. A negative result may occur with  improper specimen collection/handling, submission of specimen other than nasopharyngeal swab, presence  of viral mutation(s) within the areas targeted by this assay, and inadequate number of viral copies(<138 copies/mL). A negative result must be combined with clinical observations, patient history, and epidemiological information. The expected result is Negative.  Fact Sheet for  Patients:  BloggerCourse.com  Fact Sheet for Healthcare Providers:  SeriousBroker.it  This test is no t yet approved or cleared by the [place] FDA and  has been authorized for detection and/or diagnosis of SARS-CoV-2 by FDA under an Emergency Use Authorization (EUA). This EUA will remain  in effect (meaning this test can be used) for the duration of the COVID-19 declaration under Section 564(b)(1) of the Act, 21 U.S.C.section 360bbb-3(b)(1), unless the authorization is terminated  or revoked sooner.       Influenza A by PCR NEGATIVE NEGATIVE Final   Influenza B by PCR NEGATIVE NEGATIVE Final    Comment: (NOTE) The Xpert Xpress SARS-CoV-2/FLU/RSV plus assay is intended as an aid in the diagnosis of influenza from Nasopharyngeal swab specimens and should not be used as a sole basis for treatment. Nasal washings and aspirates are unacceptable for Xpert Xpress SARS-CoV-2/FLU/RSV testing.  Fact Sheet for Patients: BloggerCourse.com  Fact Sheet for Healthcare Providers: SeriousBroker.it  This test is not yet approved or cleared by the [place] FDA and has been authorized for detection and/or diagnosis of SARS-CoV-2 by FDA under an Emergency Use Authorization (EUA). This EUA will remain in effect (meaning this test can be used) for the duration of the COVID-19 declaration under Section 564(b)(1) of the Act, 21 U.S.C. section 360bbb-3(b)(1), unless the authorization is terminated or revoked.  Performed at [facility], [address] [place], [place] [address]      Labs:   Basic Metabolic Panel: Recent Labs  Lab 01/27/22 1734 01/28/22 0533 01/28/22 0545 01/29/22 0124  NA 141 138 138 143  K 4.2 3.1* 2.9* 4.1  CL 96*  --   --  93*  CO2 36*  --   --  42*  GLUCOSE 148*  --   --  180*  BUN 9  --   --  12  CREATININE 0.82  --   --  1.14  CALCIUM 9.2  --    --  8.8*  MG  --   --   --  2.1    CBC: Recent Labs  Lab 01/27/22 1734 01/28/22 0533 01/28/22 0545 01/29/22 0124  WBC 6.6  --   --  5.7  HGB 12.4* 12.9* 12.9* 11.9*  HCT 38.3* 38.0* 38.0* 37.6*  MCV 89.5  --   --  92.6  PLT 381  --   --  347   BNP: BNP (last 3 results) Recent Labs    05/18/21 2200 10/27/21 1912 01/28/22 0217  BNP 418.8* 197.0* 418.1*     CBG: Recent Labs  Lab 01/29/22 1120 01/29/22 1602 01/29/22 2157 01/30/22 0720 01/30/22 1115  GLUCAP 200* 261* 185* 164* 225*     IMAGING STUDIES DG Chest 2 View  Result Date: 01/27/2022 CLINICAL DATA:  Short of breath EXAM: CHEST - 2 VIEW COMPARISON:  10/27/2021 FINDINGS: Frontal and lateral views of the chest demonstrate a stable cardiac silhouette. No acute airspace disease, effusion, or pneumothorax. No acute bony abnormality. IMPRESSION: 1. No acute intrathoracic process. Electronically Signed   By: Sharlet Salina M.D.   On: 01/27/2022 18:19    DISCHARGE EXAMINATION: Vitals:   01/30/22 0652 01/30/22 0725 01/30/22 0734 01/30/22 1427  BP: (!) 187/78     Pulse:  98 (!) 105    Resp: 16     Temp: 98 F (36.7 C) (!) 97.5 F (36.4 C)    TempSrc: Oral Oral    SpO2: 97% 98% 96% 97%  Weight:      Height:       See progress note from earlier today  DISPOSITION: Home  Discharge Instructions     Call MD for:  difficulty breathing, headache or visual disturbances   Complete by: As directed    Call MD for:  extreme fatigue   Complete by: As directed    Call MD for:  persistant dizziness or light-headedness   Complete by: As directed    Call MD for:  persistant nausea and vomiting   Complete by: As directed    Call MD for:  severe uncontrolled pain   Complete by: As directed    Call MD for:  temperature >100.4   Complete by: As directed    Diet - low sodium heart healthy   Complete by: As directed    Discharge instructions   Complete by: As directed    Please take your medications as prescribed.   Please seek attention if your symptoms worsen.  Please be sure to follow-up with your providers at the [place]. use your BiPAP at home as instructed by your providers at the [place].  You were cared for by a hospitalist during your hospital stay. If you have any questions about your discharge medications or the care you received while you were in the hospital after you are discharged, you can call the unit and asked to speak with the hospitalist on call if the hospitalist that took care of you is not available. Once you are discharged, your primary care physician will handle any further medical issues. Please note that NO REFILLS for any discharge medications will be authorized once you are discharged, as it is imperative that you return to your primary care physician (or establish a relationship with a primary care physician if you do not have one) for your aftercare needs so that they can reassess your need for medications and monitor your lab values. If you do not have a primary care physician, you can call [phone] for a physician referral.   Increase activity slowly   Complete by: As directed           Allergies as of 01/30/2022       Reactions   Ace Inhibitors Cough   Aspirin Other (See Comments)   Pt does not remember reaction   Latex Other (See Comments)   Pt does not remember reaction        Medication List     STOP taking these medications    mometasone 220 MCG/ACT inhaler Commonly known as: ASMANEX   Tiotropium Bromide-Olodaterol 2.5-2.5 MCG/ACT Aers       TAKE these medications    albuterol 108 (90 Base) MCG/ACT inhaler Commonly known as: VENTOLIN HFA Inhale 2 puffs into the lungs every 6 (six) hours as needed for wheezing or shortness of breath.   amLODipine 10 MG tablet Commonly known as: NORVASC Take 5 mg by mouth daily.   atorvastatin 80 MG tablet Commonly known as: LIPITOR Take 80 mg by mouth daily.   cetirizine 10 MG tablet Commonly known as: ZYRTEC Take 10  mg by mouth daily.   Cholecalciferol 25 MCG (1000 UT) tablet Take 2,000 Units by mouth daily.   clotrimazole 1 % cream Commonly known as: LOTRIMIN Apply 1 application. topically  daily.   COD LIVER OIL PO Take 1 capsule by mouth daily.   cyanocobalamin 1000 MCG tablet Take 1 tablet (1,000 mcg total) by mouth daily.   doxycycline 100 MG tablet Commonly known as: VIBRA-TABS Take 1 tablet (100 mg total) by mouth 2 (two) times daily for 5 days.   Fish Oil 1000 MG Caps Take 1,000 mg by mouth 2 (two) times daily.   fluticasone 50 MCG/ACT nasal spray Commonly known as: FLONASE Place 2 sprays into both nostrils daily.   fluticasone-salmeterol 250-50 MCG/ACT Aepb Commonly known as: ADVAIR Inhale 1 puff into the lungs in the morning and at bedtime.   hydrochlorothiazide 25 MG tablet Commonly known as: HYDRODIURIL Take 25 mg by mouth daily.   ipratropium-albuterol 0.5-2.5 (3) MG/3ML Soln Commonly known as: DUONEB Take 3 mLs by nebulization every 4 (four) hours as needed.   ketoconazole 2 % shampoo Commonly known as: NIZORAL Apply 1 application. topically daily.   losartan 100 MG tablet Commonly known as: COZAAR Take 100 mg by mouth daily.   metFORMIN 500 MG 24 hr tablet Commonly known as: GLUCOPHAGE-XR Take 500 mg by mouth every other day.   pantoprazole 40 MG tablet Commonly known as: PROTONIX Take 40 mg by mouth 2 (two) times daily.   pioglitazone 30 MG tablet Commonly known as: ACTOS Take 30 mg by mouth daily.   predniSONE 20 MG tablet Commonly known as: DELTASONE Take 3 tablets once daily for 3 days followed by 2 tablets once daily for 3 days followed by 1 tablet once daily for 3 days and then stop   tiotropium 18 MCG inhalation capsule Commonly known as: SPIRIVA Place 1 capsule (18 mcg total) into inhaler and inhale daily.   vitamin E 180 MG (400 UNITS) capsule Take 400 Units by mouth daily.          Follow-up Information     [name], MD  Follow up.   Specialty: Pulmonary Disease Contact information: [address] [place] [address] [phone]                 TOTAL DISCHARGE TIME: 35 minutes  Sharlon Pfohl [name]  [facility] Pager on [email or web address]  01/31/2022, 12:09 PM

## 2022-01-30 NOTE — Progress Notes (Signed)
[facility] PROGRESS NOTE   Robert [name] GMW:[id number] DOB: 02/21/1961 DOA: 01/27/2022  PCP: Robert Helling, MD  Brief History/Interval Summary: 61 y.o. male with medical history significant of COPD, chronic O2 requirement of 2L, recent admit to hospital in April 2023 for COPD exacerbation.  About a month prior to admission somebody took him off of his home oxygen for unclear reason.  Since then patient has been short of breath which worsened on the day of admission.  Noted to have COPD exacerbation.  Was hospitalized for further management.  Consultants: None  Procedures: None    Subjective/Interval History: Somewhat of a poor historian at times.  Unable to really tell me if he is improving or not.  However he was able to tell me that this is not how he usually breathes at home.  He is more comfortable at home.  Denies any chest pain.  No nausea vomiting.  Frustrated that he has to wait here till Monday to get oxygen.   Assessment/Plan:  COPD with acute exacerbation/acute on chronic respiratory failure with hypoxia and hypercapnia Patient on home oxygen.  Apparently does not have a portable tank at home anymore.  However does have oxygen machine at the house. Patient presented with shortness of breath and is thought to have COPD exacerbation.  VBG noted.  pH is 7.34 with a PCO2 of 73. Patient given nebulizer treatments and started on steroids.   Patient apparently does have BiPAP at home.  Trilogy ventilator was considered however due to insurance issues that this is currently on hold. Case manager working to get him a portable tank so that he can be discharged safely. However patient at the same time has not shown much improvement.  We will increase the dose of his steroids.  Continue with nebulizer treatments.  Continue with ceftriaxone for total of 5 days.  Chest x-ray did not show any acute findings.  Diabetes mellitus type 2, uncomplicated/hyperglycemia due to  steroid Oral medications which he takes at home are currently on hold.  This includes metformin and Actos.  HbA1c was 5.8 in April.  Monitor CBGs.  Anticipate high glucose levels from being on steroids.    Essential hypertension Noted to be on amlodipine, HCTZ and losartan prior to admission. Blood pressure noted to be elevated.  He is only on amlodipine.  Will resume his losartan.  Obesity Estimated body mass index is 39.35 kg/m as calculated from the following:   Height as of this encounter: 5\' 6"  (1.676 m).   Weight as of this encounter: 110.6 kg.   DVT Prophylaxis: Lovenox Code Status: Full code Family Communication: Discussed with patient and his wife Disposition Plan: Hopefully return home when portable oxygen has been arranged and there is more clinical improvement.  Status is: Inpatient Remains inpatient appropriate because: Acute respiratory failure with hypoxia and hypercapnia     Medications: Scheduled:  amLODipine  5 mg Oral Daily   atorvastatin  80 mg Oral Daily   budesonide (PULMICORT) nebulizer solution  0.5 mg Nebulization BID   enoxaparin (LOVENOX) injection  40 mg Subcutaneous Q24H   insulin aspart  0-15 Units Subcutaneous TID WC   insulin aspart  0-5 Units Subcutaneous QHS   ipratropium-albuterol  3 mL Nebulization Q6H   loratadine  10 mg Oral Daily   pantoprazole  40 mg Oral BID   predniSONE  40 mg Oral BID WC   Continuous:  cefTRIAXone (ROCEPHIN)  IV 1 g (01/30/22 0746)   04/02/22, albuterol, ondansetron (ZOFRAN)  IV  Antibiotics: Anti-infectives (From admission, onward)    Start     Dose/Rate Route Frequency Ordered Stop   01/28/22 0600  cefTRIAXone (ROCEPHIN) 1 g in sodium chloride 0.9 % 100 mL IVPB        1 g 200 mL/hr over 30 Minutes Intravenous Daily 01/28/22 0519 02/02/22 0759       Objective:  Vital Signs  Vitals:   01/29/22 2000 01/30/22 0652 01/30/22 0725 01/30/22 0734  BP: (!) 155/92 (!) 187/78    Pulse: (!) 103 98 (!)  105   Resp: 19 16    Temp: 97.9 F (36.6 C) 98 F (36.7 C) (!) 97.5 F (36.4 C)   TempSrc: Oral Oral Oral   SpO2: 99% 97% 98% 96%  Weight:      Height:        Intake/Output Summary (Last 24 hours) at 01/30/2022 1008 Last data filed at 01/30/2022 0656 Gross per 24 hour  Intake 199.48 ml  Output 800 ml  Net -600.52 ml    Filed Weights   01/28/22 1910  Weight: 110.6 kg    General appearance: Awake alert.  In no distress Resp: Improved air entry.  Continues to have some wheezing bilaterally.  Tachypneic at rest.  No use of accessory muscles. Cardio: S1-S2 is normal regular.  No S3-S4.  No rubs murmurs or bruit GI: Abdomen is soft.  Nontender nondistended.  Bowel sounds are present normal.  No masses organomegaly Extremities: No edema.  Full range of motion of lower extremities. Neurologic: Alert and oriented x3.  No focal neurological deficits.      Lab Results:  Data Reviewed: I have personally reviewed following labs and reports of the imaging studies  CBC: Recent Labs  Lab 01/27/22 1734 01/28/22 0533 01/28/22 0545 01/29/22 0124  WBC 6.6  --   --  5.7  HGB 12.4* 12.9* 12.9* 11.9*  HCT 38.3* 38.0* 38.0* 37.6*  MCV 89.5  --   --  92.6  PLT 381  --   --  347     Basic Metabolic Panel: Recent Labs  Lab 01/27/22 1734 01/28/22 0533 01/28/22 0545 01/29/22 0124  NA 141 138 138 143  K 4.2 3.1* 2.9* 4.1  CL 96*  --   --  93*  CO2 36*  --   --  42*  GLUCOSE 148*  --   --  180*  BUN 9  --   --  12  CREATININE 0.82  --   --  1.14  CALCIUM 9.2  --   --  8.8*  MG  --   --   --  2.1     GFR: Estimated Creatinine Clearance: 79.4 mL/min (by C-G formula based on SCr of 1.14 mg/dL).   CBG: Recent Labs  Lab 01/29/22 0719 01/29/22 1120 01/29/22 1602 01/29/22 2157 01/30/22 0720  GLUCAP 170* 200* 261* 185* 164*      Radiology Studies: No results found.     LOS: 2 days   Robert [name] [name]  [facility] Pager on [email or web address]  01/30/2022, 10:08  AM

## 2022-01-30 NOTE — TOC Progression Note (Addendum)
Transition of Care [facility]) - Progression Note    Patient Details  Name: Robert [name] MRN: [id number] Date of Birth: 11-15-1960  [facility]) CM/SW Contact  Bess Kinds, RN Phone Number: [phone] 01/30/2022, 12:29 PM  Clinical Narrative:     Spoke with patient on hospital room phone to discuss post acute transition and DME through the [place]. Patient requested RN CM to contact his wife for this discussion. Spoke with spouse, Pamelia Hoit, on her cell phone. She verbalized understanding of paying required copays for DME provided through private insurance. Referral to [employer] who will discuss copay costs with spouse. TOC following for transition needs.   1320: Received notification from [employer] that spouse had paid copays. Nebulizer added to referral. Patient and spouse agreeable to transition home as long as he has portable oxygen and nebulizer.   Expected Discharge Plan: Home/Self Care (resides with wife) Barriers to Discharge: Continued Medical Work up  Expected Discharge Plan and Services Expected Discharge Plan: Home/Self Care (resides with wife)   Discharge Planning Services: CM Consult Post Acute Care Choice: Durable Medical Equipment (bipap, cpap)                   DME Arranged: NIV   Date DME Agency Contacted: 01/29/22                 Social Determinants of Health (SDOH) Interventions    Readmission Risk Interventions    10/29/2021    2:04 PM  Readmission Risk Prevention Plan  Post Dischage Appt Complete  Medication Screening Complete  Transportation Screening Complete

## 2022-01-30 NOTE — Progress Notes (Signed)
Mobility Specialist Progress Note:   01/30/22 1040  Mobility  Activity Ambulated with assistance in hallway  Level of Assistance Standby assist, set-up cues, supervision of patient - no hands on  Assistive Device Front wheel walker  Distance Ambulated (ft) 300 ft  Activity Response Tolerated well  $Mobility charge 1 Mobility   Pt received in chair willing to participate in mobility. No complaints of pain. Left in chair with call bell in reach and all needs met.   [facility] Carolin Quang Mobility Specialist

## 2022-01-30 NOTE — Progress Notes (Signed)
Patient ambulated to bathroom this morning on room air and dropped to 84%. 3L of oxygen required to keep O2 saturation above 92%.

## 2022-01-30 NOTE — Progress Notes (Signed)
Patient is complaining of SOB and has requested a breathing treatment. Two breathing treatments were administered (please see MAR). Omelia Blackwater, NP was made aware. Will continue to monitor patient.

## 2022-02-15 ENCOUNTER — Telehealth: Payer: Self-pay | Admitting: Pulmonary Disease

## 2022-02-15 NOTE — Telephone Encounter (Signed)
Received request from [facility] for medical records from 07/26/2020 to present.  Sent message to fax# [phone] that patient has not been seen at this practice since May 2021.  Included fax# for [facility] Medical Records dept.

## 2022-09-30 IMAGING — CT CT ANGIO CHEST
2 of 6 series · 18 of 36 positions shown · IV contrast (agent unspecified)
Comparison: Chest CT 01/19/2021.

CLINICAL DATA: 61-year-old male with clinical suspicion of
pulmonary embolism (high probability).

EXAM:
CT ANGIOGRAPHY CHEST WITH CONTRAST
TECHNIQUE: Multidetector CT imaging of the chest was performed using the
standard protocol during bolus administration of intravenous
contrast. Multiplanar CT image reconstructions and MIPs were
obtained to evaluate the vascular anatomy.

[Series 7: pe thins · axial · 0.83mm/px · z∈[+1098,+1399]mm · 17 of 479 slices shown]
[im 24/479  lung]
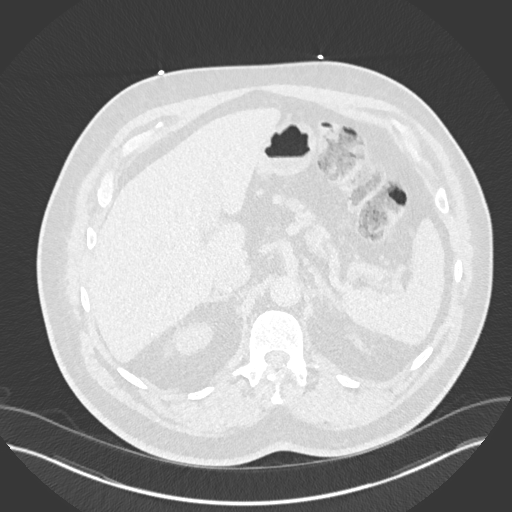
[im 48/479  mediastinal]
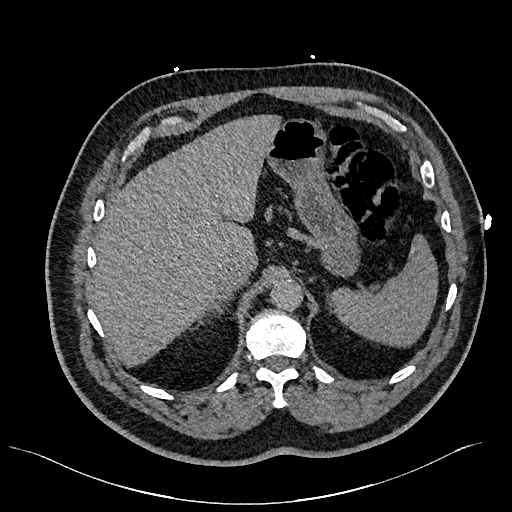
[im 72/479  lung]
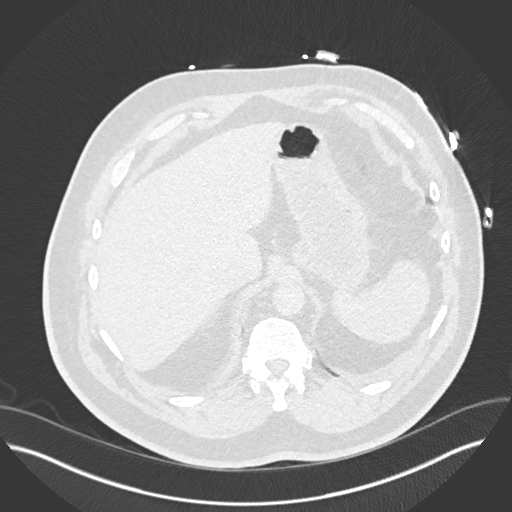
[im 96/479  mediastinal]
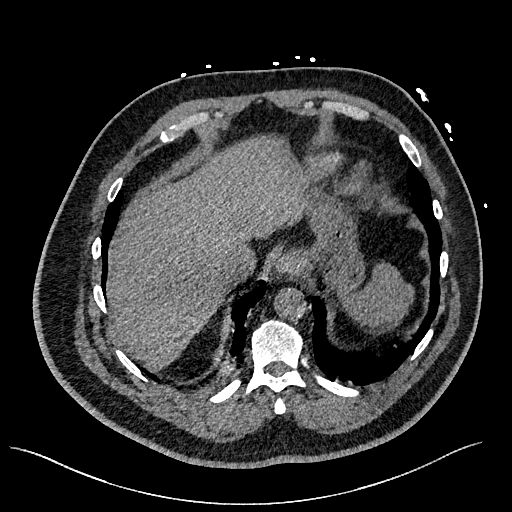
[im 144/479  lung]
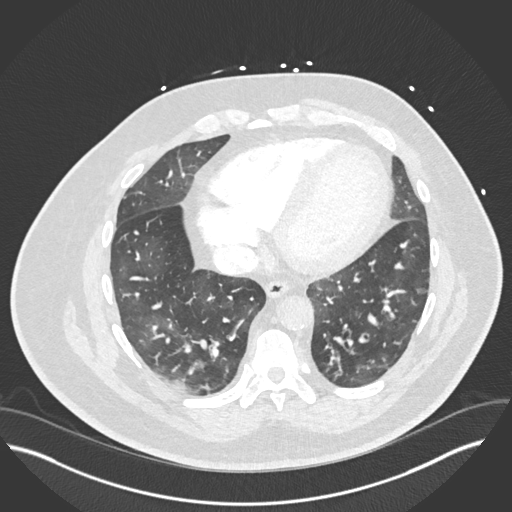
[im 168/479  mediastinal]
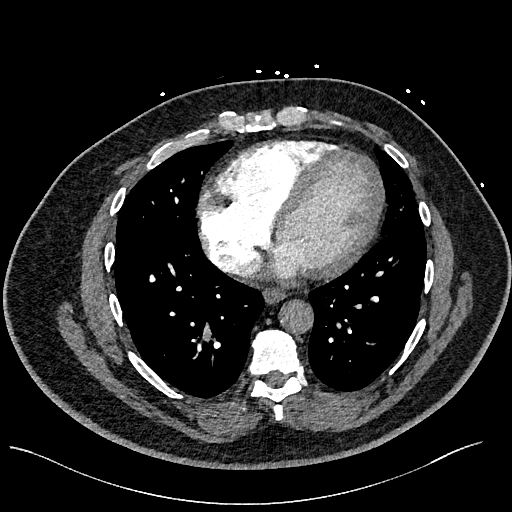
[im 192/479  lung]
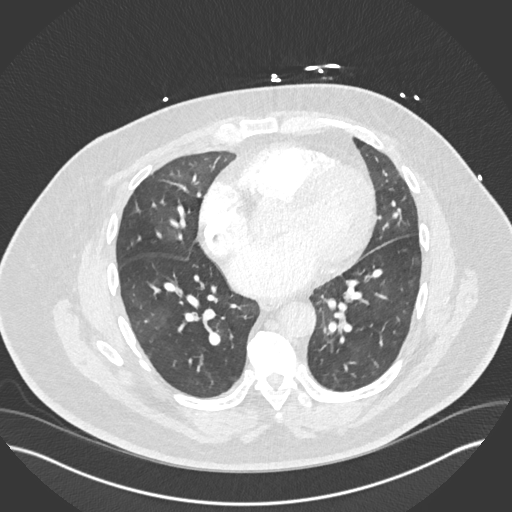
[im 216/479  mediastinal]
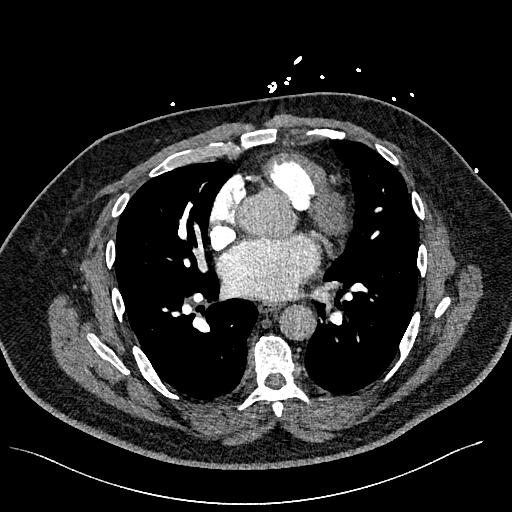
[im 240/479  lung]
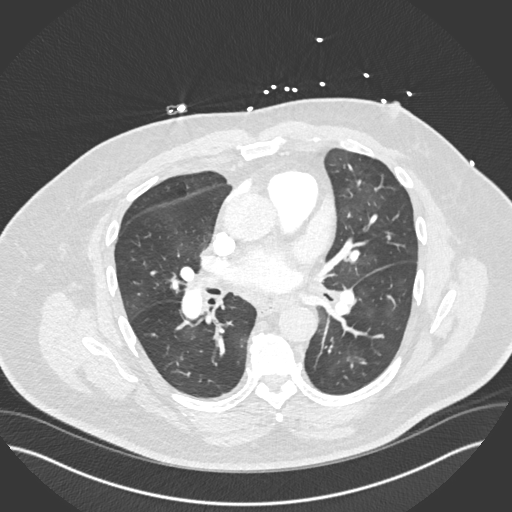
[im 263/479  mediastinal]
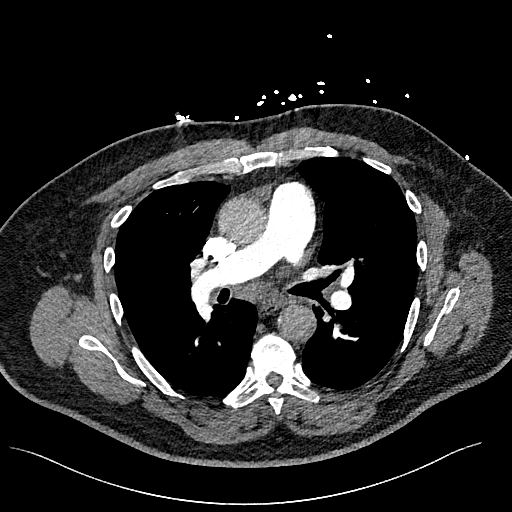
[im 287/479  lung]
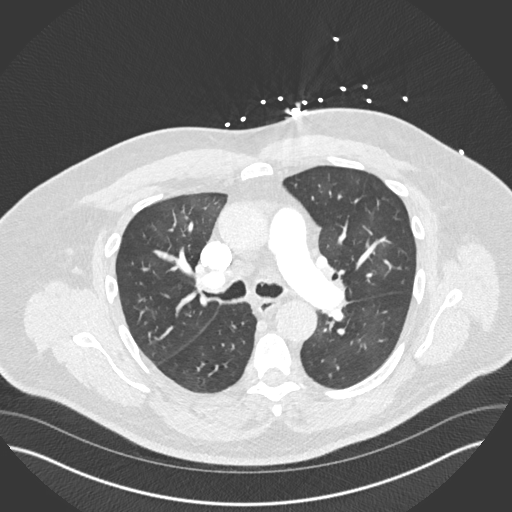
[im 311/479  mediastinal]
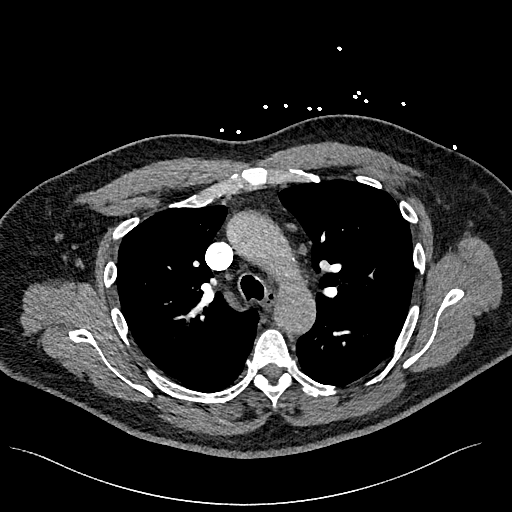
[im 335/479  lung]
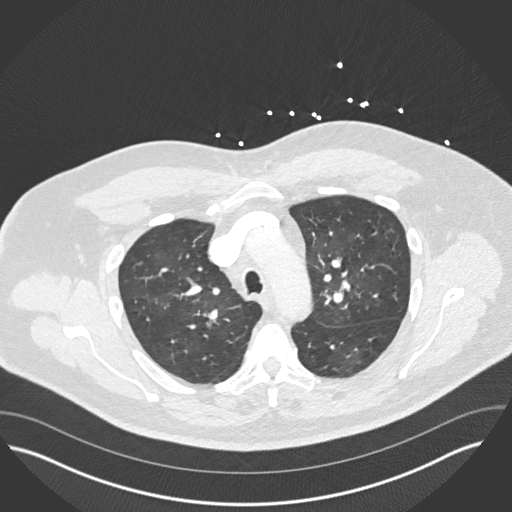
[im 383/479  mediastinal]
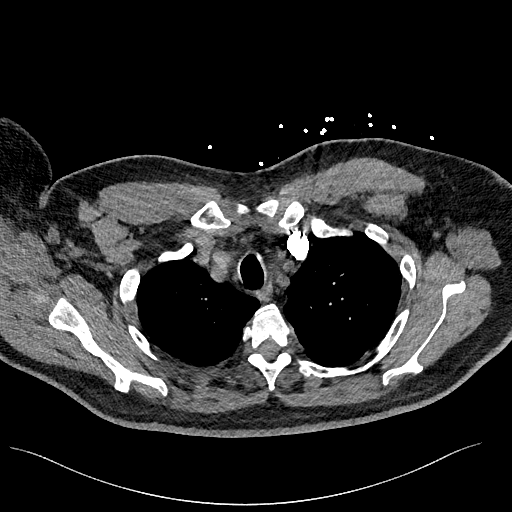
[im 407/479  lung]
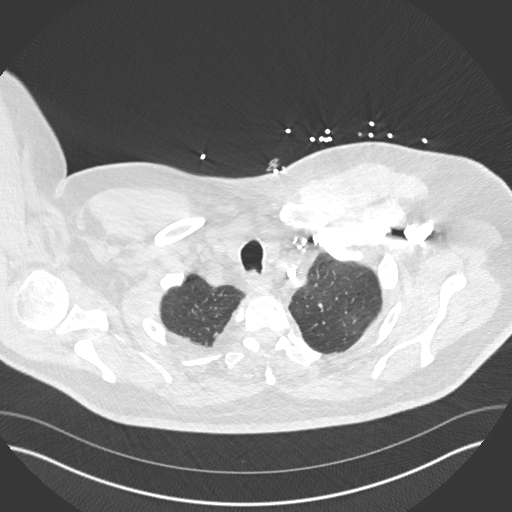
[im 431/479  mediastinal]
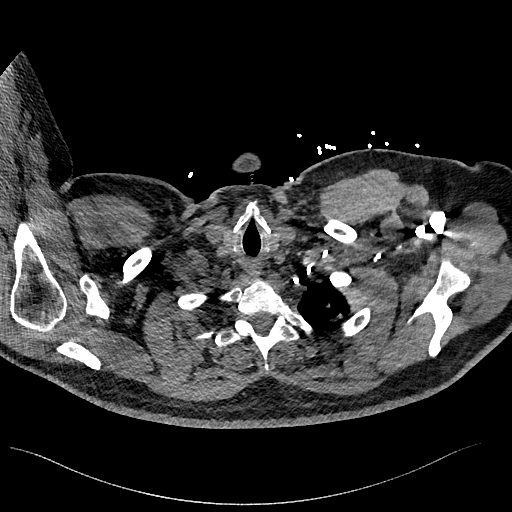
[im 455/479  lung]
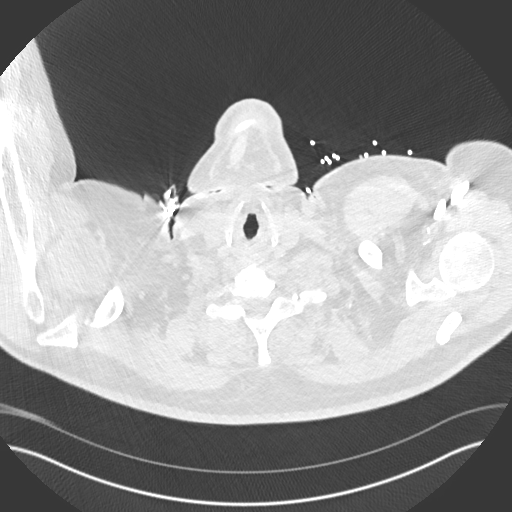

[Series 8: pe 2mm cor · coronal · 0.65mm/px · 1 of 161 slices shown]
[im 81/161  mediastinal]
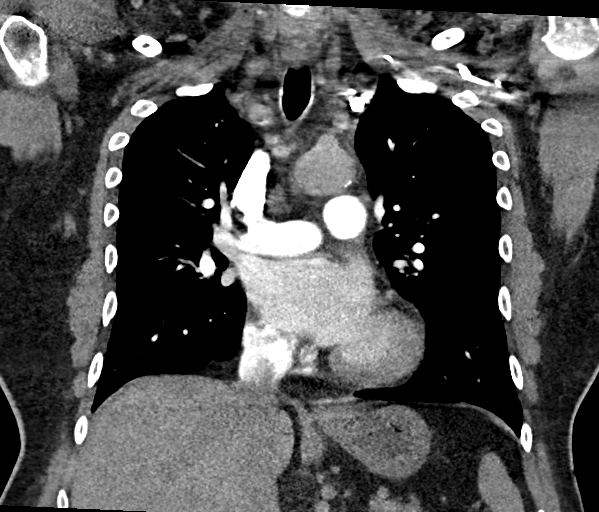

[18 of 36 positions shown; findings below may reference images not displayed]

RADIATION DOSE REDUCTION: This exam was performed according to the
departmental dose-optimization program which includes automated
exposure control, adjustment of the mA and/or kV according to
patient size and/or use of iterative reconstruction technique.

CONTRAST:  75mL OMNIPAQUE IOHEXOL 350 MG/ML SOLN
FINDINGS: Cardiovascular: No filling defects within the pulmonary arterial
tree to suggest pulmonary embolism. Heart size is borderline
enlarged. There is no significant pericardial fluid, thickening or
pericardial calcification. There is aortic atherosclerosis, as well
as atherosclerosis of the great vessels of the mediastinum and the
coronary arteries, including calcified atherosclerotic plaque in the
left main, left circumflex and right coronary arteries.

Mediastinum/Nodes: No pathologically enlarged mediastinal or hilar
lymph nodes. Esophagus is unremarkable in appearance. No axillary
lymphadenopathy.

Lungs/Pleura: Throughout the lungs bilaterally there are some patchy
areas of mild ground-glass attenuation which appear predominantly
random in distribution. No confluent consolidative airspace disease.
No pleural effusions. Areas of architectural distortion in the lung
bases are noted bilaterally, which may reflect areas of chronic post
infectious or inflammatory scarring.

Upper Abdomen: Aortic atherosclerosis.

Musculoskeletal: There are no aggressive appearing lytic or blastic
lesions noted in the visualized portions of the skeleton.

Review of the MIP images confirms the above findings.
IMPRESSION: 1. No evidence of pulmonary embolism.
2. Nonspecific areas of ground-glass attenuation scattered
throughout both lungs, favored to be of infectious or inflammatory
etiology.
3. Aortic atherosclerosis, in addition to left main and 2 vessel
coronary artery disease. Please note that although the presence of
coronary artery calcium documents the presence of coronary artery
disease, the severity of this disease and any potential stenosis
cannot be assessed on this non-gated CT examination. Assessment for
potential risk factor modification, dietary therapy or pharmacologic
therapy may be warranted, if clinically indicated.

Aortic Atherosclerosis ([id number]).

## 2023-06-30 ENCOUNTER — Other Ambulatory Visit: Payer: Self-pay

## 2023-06-30 ENCOUNTER — Inpatient Hospital Stay (HOSPITAL_COMMUNITY)
Admission: EM | Admit: 2023-06-30 | Discharge: 2023-07-27 | DRG: 207 | Disposition: E | Payer: No Typology Code available for payment source | Attending: Pulmonary Disease | Admitting: Pulmonary Disease

## 2023-06-30 ENCOUNTER — Inpatient Hospital Stay (HOSPITAL_COMMUNITY): Payer: No Typology Code available for payment source

## 2023-06-30 ENCOUNTER — Emergency Department (HOSPITAL_COMMUNITY): Payer: No Typology Code available for payment source

## 2023-06-30 ENCOUNTER — Encounter (HOSPITAL_COMMUNITY): Payer: Self-pay

## 2023-06-30 DIAGNOSIS — Z800 Family history of malignant neoplasm of digestive organs: Secondary | ICD-10-CM | POA: Diagnosis not present

## 2023-06-30 DIAGNOSIS — Z66 Do not resuscitate: Secondary | ICD-10-CM | POA: Diagnosis present

## 2023-06-30 DIAGNOSIS — J9601 Acute respiratory failure with hypoxia: Secondary | ICD-10-CM

## 2023-06-30 DIAGNOSIS — E872 Acidosis, unspecified: Secondary | ICD-10-CM | POA: Diagnosis present

## 2023-06-30 DIAGNOSIS — E785 Hyperlipidemia, unspecified: Secondary | ICD-10-CM | POA: Diagnosis present

## 2023-06-30 DIAGNOSIS — Z9981 Dependence on supplemental oxygen: Secondary | ICD-10-CM | POA: Diagnosis not present

## 2023-06-30 DIAGNOSIS — I469 Cardiac arrest, cause unspecified: Principal | ICD-10-CM

## 2023-06-30 DIAGNOSIS — I468 Cardiac arrest due to other underlying condition: Secondary | ICD-10-CM | POA: Diagnosis present

## 2023-06-30 DIAGNOSIS — G40409 Other generalized epilepsy and epileptic syndromes, not intractable, without status epilepticus: Secondary | ICD-10-CM | POA: Diagnosis not present

## 2023-06-30 DIAGNOSIS — J449 Chronic obstructive pulmonary disease, unspecified: Secondary | ICD-10-CM | POA: Diagnosis not present

## 2023-06-30 DIAGNOSIS — Z1152 Encounter for screening for COVID-19: Secondary | ICD-10-CM | POA: Diagnosis not present

## 2023-06-30 DIAGNOSIS — J9602 Acute respiratory failure with hypercapnia: Secondary | ICD-10-CM | POA: Diagnosis not present

## 2023-06-30 DIAGNOSIS — J989 Respiratory disorder, unspecified: Secondary | ICD-10-CM | POA: Diagnosis not present

## 2023-06-30 DIAGNOSIS — Z87891 Personal history of nicotine dependence: Secondary | ICD-10-CM | POA: Diagnosis not present

## 2023-06-30 DIAGNOSIS — E119 Type 2 diabetes mellitus without complications: Secondary | ICD-10-CM | POA: Diagnosis present

## 2023-06-30 DIAGNOSIS — Z515 Encounter for palliative care: Secondary | ICD-10-CM

## 2023-06-30 DIAGNOSIS — Z7951 Long term (current) use of inhaled steroids: Secondary | ICD-10-CM

## 2023-06-30 DIAGNOSIS — Z91040 Latex allergy status: Secondary | ICD-10-CM

## 2023-06-30 DIAGNOSIS — G253 Myoclonus: Secondary | ICD-10-CM | POA: Diagnosis present

## 2023-06-30 DIAGNOSIS — R569 Unspecified convulsions: Secondary | ICD-10-CM | POA: Diagnosis present

## 2023-06-30 DIAGNOSIS — I428 Other cardiomyopathies: Secondary | ICD-10-CM | POA: Diagnosis not present

## 2023-06-30 DIAGNOSIS — G931 Anoxic brain damage, not elsewhere classified: Secondary | ICD-10-CM | POA: Diagnosis present

## 2023-06-30 DIAGNOSIS — J441 Chronic obstructive pulmonary disease with (acute) exacerbation: Secondary | ICD-10-CM | POA: Diagnosis present

## 2023-06-30 DIAGNOSIS — I110 Hypertensive heart disease with heart failure: Secondary | ICD-10-CM | POA: Diagnosis present

## 2023-06-30 DIAGNOSIS — Z6841 Body Mass Index (BMI) 40.0 and over, adult: Secondary | ICD-10-CM

## 2023-06-30 DIAGNOSIS — Z888 Allergy status to other drugs, medicaments and biological substances status: Secondary | ICD-10-CM

## 2023-06-30 DIAGNOSIS — Z8249 Family history of ischemic heart disease and other diseases of the circulatory system: Secondary | ICD-10-CM | POA: Diagnosis not present

## 2023-06-30 DIAGNOSIS — E876 Hypokalemia: Secondary | ICD-10-CM | POA: Diagnosis present

## 2023-06-30 DIAGNOSIS — J9621 Acute and chronic respiratory failure with hypoxia: Principal | ICD-10-CM | POA: Diagnosis present

## 2023-06-30 DIAGNOSIS — Z7984 Long term (current) use of oral hypoglycemic drugs: Secondary | ICD-10-CM

## 2023-06-30 DIAGNOSIS — J9622 Acute and chronic respiratory failure with hypercapnia: Secondary | ICD-10-CM | POA: Diagnosis present

## 2023-06-30 DIAGNOSIS — J9612 Chronic respiratory failure with hypercapnia: Secondary | ICD-10-CM | POA: Diagnosis present

## 2023-06-30 DIAGNOSIS — I5021 Acute systolic (congestive) heart failure: Secondary | ICD-10-CM | POA: Diagnosis present

## 2023-06-30 DIAGNOSIS — Z886 Allergy status to analgesic agent status: Secondary | ICD-10-CM

## 2023-06-30 DIAGNOSIS — J439 Emphysema, unspecified: Secondary | ICD-10-CM | POA: Diagnosis present

## 2023-06-30 DIAGNOSIS — Z79899 Other long term (current) drug therapy: Secondary | ICD-10-CM

## 2023-06-30 LAB — CBC
HCT: 46.9 % (ref 39.0–52.0)
HCT: 38.2 % — ABNORMAL LOW (ref 39.0–52.0)
Hemoglobin: 13.6 g/dL (ref 13.0–17.0)
Hemoglobin: 12.1 g/dL — ABNORMAL LOW (ref 13.0–17.0)
MCH: 28.8 pg (ref 26.0–34.0)
MCH: 28.9 pg (ref 26.0–34.0)
MCHC: 29 g/dL — ABNORMAL LOW (ref 30.0–36.0)
MCHC: 31.7 g/dL (ref 30.0–36.0)
MCV: 99.2 fL (ref 80.0–100.0)
MCV: 91.2 fL (ref 80.0–100.0)
Platelets: 325 10*3/uL (ref 150–400)
Platelets: 234 10*3/uL (ref 150–400)
RBC: 4.73 MIL/uL (ref 4.22–5.81)
RBC: 4.19 MIL/uL — ABNORMAL LOW (ref 4.22–5.81)
RDW: 14.5 % (ref 11.5–15.5)
RDW: 14.6 % (ref 11.5–15.5)
WBC: 10.6 10*3/uL — ABNORMAL HIGH (ref 4.0–10.5)
WBC: 10.4 10*3/uL (ref 4.0–10.5)
nRBC: 0 % (ref 0.0–0.2)
nRBC: 0 % (ref 0.0–0.2)

## 2023-06-30 LAB — POCT I-STAT 7, (LYTES, BLD GAS, ICA,H+H)
Acid-Base Excess: 12 mmol/L — ABNORMAL HIGH (ref 0.0–2.0)
Acid-Base Excess: 13 mmol/L — ABNORMAL HIGH (ref 0.0–2.0)
Acid-Base Excess: 12 mmol/L — ABNORMAL HIGH (ref 0.0–2.0)
Bicarbonate: 36.3 mmol/L — ABNORMAL HIGH (ref 20.0–28.0)
Bicarbonate: 36.1 mmol/L — ABNORMAL HIGH (ref 20.0–28.0)
Bicarbonate: 36.1 mmol/L — ABNORMAL HIGH (ref 20.0–28.0)
Calcium, Ion: 1.07 mmol/L — ABNORMAL LOW (ref 1.15–1.40)
Calcium, Ion: 1.03 mmol/L — ABNORMAL LOW (ref 1.15–1.40)
Calcium, Ion: 1.08 mmol/L — ABNORMAL LOW (ref 1.15–1.40)
HCT: 38 % — ABNORMAL LOW (ref 39.0–52.0)
HCT: 38 % — ABNORMAL LOW (ref 39.0–52.0)
HCT: 37 % — ABNORMAL LOW (ref 39.0–52.0)
Hemoglobin: 12.9 g/dL — ABNORMAL LOW (ref 13.0–17.0)
Hemoglobin: 12.9 g/dL — ABNORMAL LOW (ref 13.0–17.0)
Hemoglobin: 12.6 g/dL — ABNORMAL LOW (ref 13.0–17.0)
O2 Saturation: 99 %
O2 Saturation: 100 %
O2 Saturation: 98 %
Patient temperature: 96.3
Patient temperature: 36.4
Patient temperature: 97.9
Potassium: 3.5 mmol/L (ref 3.5–5.1)
Potassium: 3.2 mmol/L — ABNORMAL LOW (ref 3.5–5.1)
Potassium: 3.2 mmol/L — ABNORMAL LOW (ref 3.5–5.1)
Sodium: 142 mmol/L (ref 135–145)
Sodium: 143 mmol/L (ref 135–145)
Sodium: 142 mmol/L (ref 135–145)
TCO2: 38 mmol/L — ABNORMAL HIGH (ref 22–32)
TCO2: 37 mmol/L — ABNORMAL HIGH (ref 22–32)
TCO2: 37 mmol/L — ABNORMAL HIGH (ref 22–32)
pCO2 arterial: 42.8 mm[Hg] (ref 32–48)
pCO2 arterial: 36.1 mm[Hg] (ref 32–48)
pCO2 arterial: 41.4 mm[Hg] (ref 32–48)
pH, Arterial: 7.532 — ABNORMAL HIGH (ref 7.35–7.45)
pH, Arterial: 7.606 (ref 7.35–7.45)
pH, Arterial: 7.547 — ABNORMAL HIGH (ref 7.35–7.45)
pO2, Arterial: 128 mm[Hg] — ABNORMAL HIGH (ref 83–108)
pO2, Arterial: 136 mm[Hg] — ABNORMAL HIGH (ref 83–108)
pO2, Arterial: 85 mm[Hg] (ref 83–108)

## 2023-06-30 LAB — PROTIME-INR
INR: 1.3 — ABNORMAL HIGH (ref 0.8–1.2)
INR: 1.3 — ABNORMAL HIGH (ref 0.8–1.2)
Prothrombin Time: 16.5 s — ABNORMAL HIGH (ref 11.4–15.2)
Prothrombin Time: 16.7 s — ABNORMAL HIGH (ref 11.4–15.2)

## 2023-06-30 LAB — ECHOCARDIOGRAM COMPLETE
Calc EF: 37.6 %
S' Lateral: 4.3 cm
Single Plane A2C EF: 40 %
Single Plane A4C EF: 36.2 %

## 2023-06-30 LAB — RAPID URINE DRUG SCREEN, HOSP PERFORMED
Amphetamines: NOT DETECTED
Barbiturates: NOT DETECTED
Benzodiazepines: NOT DETECTED
Cocaine: NOT DETECTED
Opiates: NOT DETECTED
Tetrahydrocannabinol: NOT DETECTED

## 2023-06-30 LAB — CREATININE, SERUM
Creatinine, Ser: 1.11 mg/dL (ref 0.61–1.24)
GFR, Estimated: >60 mL/min (ref >60)

## 2023-06-30 LAB — APTT
aPTT: 35 s (ref 24–36)
aPTT: 31 s (ref 24–36)

## 2023-06-30 LAB — GLUCOSE, CAPILLARY
Glucose-Capillary: 143 mg/dL — ABNORMAL HIGH (ref 70–99)
Glucose-Capillary: 180 mg/dL — ABNORMAL HIGH (ref 70–99)
Glucose-Capillary: 137 mg/dL — ABNORMAL HIGH (ref 70–99)
Glucose-Capillary: 134 mg/dL — ABNORMAL HIGH (ref 70–99)

## 2023-06-30 LAB — MRSA NEXT GEN BY PCR, NASAL: MRSA by PCR Next Gen: NOT DETECTED

## 2023-06-30 LAB — I-STAT ARTERIAL BLOOD GAS, ED
Acid-Base Excess: 5 mmol/L — ABNORMAL HIGH (ref 0.0–2.0)
Bicarbonate: 36.6 mmol/L — ABNORMAL HIGH (ref 20.0–28.0)
Calcium, Ion: 1.13 mmol/L — ABNORMAL LOW (ref 1.15–1.40)
HCT: 40 % (ref 39.0–52.0)
Hemoglobin: 13.6 g/dL (ref 13.0–17.0)
O2 Saturation: 100 %
Potassium: 5.4 mmol/L — ABNORMAL HIGH (ref 3.5–5.1)
Sodium: 139 mmol/L (ref 135–145)
TCO2: 39 mmol/L — ABNORMAL HIGH (ref 22–32)
pCO2 arterial: 96.3 mm[Hg] (ref 32–48)
pH, Arterial: 7.188 — CL (ref 7.35–7.45)
pO2, Arterial: 401 mm[Hg] — ABNORMAL HIGH (ref 83–108)

## 2023-06-30 LAB — TYPE AND SCREEN
ABO/RH(D): A POS
Antibody Screen: NEGATIVE

## 2023-06-30 LAB — COMPREHENSIVE METABOLIC PANEL
ALT: 17 U/L (ref 0–44)
AST: 31 U/L (ref 15–41)
Albumin: 3.8 g/dL (ref 3.5–5.0)
Alkaline Phosphatase: 75 U/L (ref 38–126)
Anion gap: 14 (ref 5–15)
BUN: 15 mg/dL (ref 8–23)
CO2: 33 mmol/L — ABNORMAL HIGH (ref 22–32)
Calcium: 8.6 mg/dL — ABNORMAL LOW (ref 8.9–10.3)
Chloride: 92 mmol/L — ABNORMAL LOW (ref 98–111)
Creatinine, Ser: 1.32 mg/dL — ABNORMAL HIGH (ref 0.61–1.24)
GFR, Estimated: >60 mL/min (ref >60)
Glucose, Bld: 191 mg/dL — ABNORMAL HIGH (ref 70–99)
Potassium: 4.6 mmol/L (ref 3.5–5.1)
Sodium: 139 mmol/L (ref 135–145)
Total Bilirubin: 1.2 mg/dL — ABNORMAL HIGH (ref <1.2)
Total Protein: 7 g/dL (ref 6.5–8.1)

## 2023-06-30 LAB — MAGNESIUM: Magnesium: 2.1 mg/dL (ref 1.7–2.4)

## 2023-06-30 LAB — PHOSPHORUS: Phosphorus: 8.2 mg/dL — ABNORMAL HIGH (ref 2.5–4.6)

## 2023-06-30 LAB — LACTIC ACID, PLASMA: Lactic Acid, Venous: 2 mmol/L (ref 0.5–1.9)

## 2023-06-30 LAB — CBG MONITORING, ED: Glucose-Capillary: 141 mg/dL — ABNORMAL HIGH (ref 70–99)

## 2023-06-30 LAB — PROCALCITONIN
Procalcitonin: <0.1 ng/mL
Procalcitonin: 1.94 ng/mL

## 2023-06-30 LAB — HEMOGLOBIN A1C
Hgb A1c MFr Bld: 6.2 % — ABNORMAL HIGH (ref 4.8–5.6)
Mean Plasma Glucose: 131.24 mg/dL

## 2023-06-30 LAB — ABO/RH: ABO/RH(D): A POS

## 2023-06-30 LAB — I-STAT CG4 LACTIC ACID, ED: Lactic Acid, Venous: 6.2 mmol/L (ref 0.5–1.9)

## 2023-06-30 LAB — TROPONIN I (HIGH SENSITIVITY)
Troponin I (High Sensitivity): 37 ng/L — ABNORMAL HIGH (ref <18)
Troponin I (High Sensitivity): 47 ng/L — ABNORMAL HIGH (ref <18)

## 2023-06-30 LAB — BRAIN NATRIURETIC PEPTIDE: B Natriuretic Peptide: 954.5 pg/mL — ABNORMAL HIGH (ref 0.0–100.0)

## 2023-06-30 LAB — HIV ANTIBODY (ROUTINE TESTING W REFLEX): HIV Screen 4th Generation wRfx: NONREACTIVE

## 2023-06-30 LAB — TRIGLYCERIDES: Triglycerides: 56 mg/dL (ref <150)

## 2023-06-30 MED ORDER — BUSPIRONE HCL 15 MG PO TABS: 30.0000 mg | ORAL_TABLET | Freq: Three times a day (TID) | ORAL | Status: AC | PRN

## 2023-06-30 MED ORDER — VALPROATE SODIUM 100 MG/ML IV SOLN
500.0000 mg | Freq: Three times a day (TID) | INTRAVENOUS | Status: DC
  Administered 2023-07-01 – 2023-07-04 (×10): 500 mg via INTRAVENOUS
  Filled 2023-06-30 (×6): qty 5
  Filled 2023-06-30 (×4): qty 500
  Filled 2023-06-30 (×2): qty 5
  Filled 2023-06-30: qty 500

## 2023-06-30 MED ORDER — ETOMIDATE 2 MG/ML IV SOLN: 30.0000 mg | Freq: Once | INTRAVENOUS | Status: DC

## 2023-06-30 MED ORDER — FAMOTIDINE 20 MG PO TABS
20.0000 mg | ORAL_TABLET | Freq: Two times a day (BID) | ORAL | Status: DC
Start: 2023-06-30 — End: 2023-06-30
  Administered 2023-06-30 (×2): 20 mg
  Filled 2023-06-30 (×2): qty 1

## 2023-06-30 MED ORDER — DOCUSATE SODIUM 50 MG/5ML PO LIQD: 100.0000 mg | Freq: Two times a day (BID) | ORAL | Status: DC | PRN

## 2023-06-30 MED ORDER — PANTOPRAZOLE SODIUM 40 MG IV SOLR
40.0000 mg | Freq: Every day | INTRAVENOUS | Status: DC
  Administered 2023-06-30 – 2023-07-03 (×4): 40 mg via INTRAVENOUS
  Filled 2023-06-30 (×6): qty 10

## 2023-06-30 MED ORDER — ARFORMOTEROL TARTRATE 15 MCG/2ML IN NEBU
15.0000 ug | INHALATION_SOLUTION | Freq: Two times a day (BID) | RESPIRATORY_TRACT | Status: DC
  Administered 2023-06-30 – 2023-07-04 (×8): 15 ug via RESPIRATORY_TRACT
  Filled 2023-06-30 (×8): qty 2

## 2023-06-30 MED ORDER — ROCURONIUM BROMIDE 10 MG/ML (PF) SYRINGE: 100.0000 mg | PREFILLED_SYRINGE | Freq: Once | INTRAVENOUS | Status: DC

## 2023-06-30 MED ORDER — NOREPINEPHRINE 4 MG/250ML-% IV SOLN: 2.0000 ug/min | INTRAVENOUS | Status: DC

## 2023-06-30 MED ORDER — SODIUM CHLORIDE 0.9 % IV SOLN
250.0000 mL | INTRAVENOUS | Status: AC
  Administered 2023-06-30: 250 mL via INTRAVENOUS

## 2023-06-30 MED ORDER — BUDESONIDE 0.25 MG/2ML IN SUSP
0.2500 mg | Freq: Two times a day (BID) | RESPIRATORY_TRACT | Status: DC
  Administered 2023-06-30 – 2023-07-04 (×8): 0.25 mg via RESPIRATORY_TRACT
  Filled 2023-06-30 (×8): qty 2

## 2023-06-30 MED ORDER — ACETAMINOPHEN 160 MG/5ML PO SOLN
650.0000 mg | ORAL | Status: AC
  Administered 2023-07-01 – 2023-07-02 (×3): 650 mg
  Filled 2023-06-30 (×4): qty 20.3

## 2023-06-30 MED ORDER — ORAL CARE MOUTH RINSE: 15.0000 mL | OROMUCOSAL | Status: DC | PRN

## 2023-06-30 MED ORDER — METHYLPREDNISOLONE SODIUM SUCC 40 MG IJ SOLR
40.0000 mg | Freq: Every day | INTRAMUSCULAR | Status: DC
  Administered 2023-07-03: 40 mg via INTRAVENOUS
  Filled 2023-06-30: qty 1

## 2023-06-30 MED ORDER — SODIUM BICARBONATE 8.4 % IV SOLN
100.0000 meq | Freq: Once | INTRAVENOUS | Status: AC
  Administered 2023-06-30: 100 meq via INTRAVENOUS
  Filled 2023-06-30: qty 100

## 2023-06-30 MED ORDER — SODIUM CHLORIDE 0.9 % IV SOLN: 250.0000 mL | INTRAVENOUS | Status: AC

## 2023-06-30 MED ORDER — MAGNESIUM SULFATE 2 GM/50ML IV SOLN: 2.0000 g | Freq: Once | INTRAVENOUS | Status: AC | PRN

## 2023-06-30 MED ORDER — ACETAMINOPHEN 325 MG PO TABS: 650.0000 mg | ORAL_TABLET | ORAL | Status: DC | PRN

## 2023-06-30 MED ORDER — PROPOFOL 1000 MG/100ML IV EMUL
5.0000 ug/kg/min | INTRAVENOUS | Status: DC
Start: 2023-06-30 — End: 2023-06-30
  Administered 2023-06-30: 5 ug/kg/min via INTRAVENOUS

## 2023-06-30 MED ORDER — ACETAMINOPHEN 325 MG PO TABS
650.0000 mg | ORAL_TABLET | ORAL | Status: AC
  Administered 2023-06-30 – 2023-07-01 (×2): 650 mg
  Filled 2023-06-30 (×3): qty 2

## 2023-06-30 MED ORDER — MIDAZOLAM HCL 2 MG/2ML IJ SOLN
4.0000 mg | INTRAMUSCULAR | Status: DC | PRN
  Administered 2023-06-30 – 2023-07-01 (×2): 4 mg via INTRAVENOUS
  Filled 2023-06-30 (×2): qty 4

## 2023-06-30 MED ORDER — DOCUSATE SODIUM 100 MG PO CAPS: 100.0000 mg | ORAL_CAPSULE | Freq: Two times a day (BID) | ORAL | Status: DC | PRN

## 2023-06-30 MED ORDER — HEPARIN SODIUM (PORCINE) 5000 UNIT/ML IJ SOLN
5000.0000 [IU] | Freq: Three times a day (TID) | INTRAMUSCULAR | Status: DC
  Administered 2023-06-30 – 2023-07-01 (×3): 5000 [IU] via SUBCUTANEOUS
  Filled 2023-06-30 (×4): qty 1

## 2023-06-30 MED ORDER — VALPROATE SODIUM 100 MG/ML IV SOLN
1000.0000 mg | Freq: Once | INTRAVENOUS | Status: AC
  Administered 2023-06-30: 1000 mg via INTRAVENOUS
  Filled 2023-06-30: qty 10

## 2023-06-30 MED ORDER — POLYETHYLENE GLYCOL 3350 17 G PO PACK
17.0000 g | PACK | Freq: Every day | ORAL | Status: DC
  Administered 2023-06-30 – 2023-07-02 (×3): 17 g
  Filled 2023-06-30 (×3): qty 1

## 2023-06-30 MED ORDER — FENTANYL CITRATE PF 50 MCG/ML IJ SOSY
50.0000 ug | PREFILLED_SYRINGE | INTRAMUSCULAR | Status: DC | PRN
Start: 2023-06-30 — End: 2023-07-04
  Administered 2023-07-03: 50 ug via INTRAVENOUS
  Filled 2023-06-30: qty 1
  Filled 2023-06-30: qty 2

## 2023-06-30 MED ORDER — PROPOFOL 1000 MG/100ML IV EMUL
0.0000 ug/kg/min | INTRAVENOUS | Status: DC
Start: 2023-06-30 — End: 2023-07-04
  Administered 2023-06-30 (×3): 50 ug/kg/min via INTRAVENOUS
  Administered 2023-07-01: 30 ug/kg/min via INTRAVENOUS
  Administered 2023-07-01 (×5): 50 ug/kg/min via INTRAVENOUS
  Administered 2023-07-01: 40 ug/kg/min via INTRAVENOUS
  Administered 2023-07-01: 50 ug/kg/min via INTRAVENOUS
  Administered 2023-07-02 (×2): 40 ug/kg/min via INTRAVENOUS
  Filled 2023-06-30 (×14): qty 100

## 2023-06-30 MED ORDER — LACTATED RINGERS IV BOLUS
1000.0000 mL | Freq: Once | INTRAVENOUS | Status: AC
  Administered 2023-06-30: 1000 mL via INTRAVENOUS

## 2023-06-30 MED ORDER — ORAL CARE MOUTH RINSE
15.0000 mL | OROMUCOSAL | Status: DC
  Administered 2023-06-30 – 2023-07-04 (×43): 15 mL via OROMUCOSAL

## 2023-06-30 MED ORDER — PROPOFOL 1000 MG/100ML IV EMUL
0.0000 ug/kg/min | INTRAVENOUS | Status: DC
  Administered 2023-06-30: 5 ug/kg/min via INTRAVENOUS

## 2023-06-30 MED ORDER — ACETAMINOPHEN 650 MG RE SUPP: 650.0000 mg | RECTAL | Status: AC

## 2023-06-30 MED ORDER — IOHEXOL 350 MG/ML SOLN
100.0000 mL | Freq: Once | INTRAVENOUS | Status: AC | PRN
  Administered 2023-06-30: 100 mL via INTRAVENOUS

## 2023-06-30 MED ORDER — NOREPINEPHRINE 4 MG/250ML-% IV SOLN: 5.0000 ug/min | INTRAVENOUS | Status: DC

## 2023-06-30 MED ORDER — ROCURONIUM BROMIDE 10 MG/ML (PF) SYRINGE
PREFILLED_SYRINGE | INTRAVENOUS | Status: DC | PRN
  Administered 2023-06-30: 100 mg via INTRAVENOUS

## 2023-06-30 MED ORDER — ACETAMINOPHEN 650 MG RE SUPP: 650.0000 mg | RECTAL | Status: DC | PRN

## 2023-06-30 MED ORDER — NOREPINEPHRINE 4 MG/250ML-% IV SOLN
0.0000 ug/min | INTRAVENOUS | Status: DC
  Administered 2023-06-30: 5 ug/min via INTRAVENOUS

## 2023-06-30 MED ORDER — REVEFENACIN 175 MCG/3ML IN SOLN
175.0000 ug | Freq: Every day | RESPIRATORY_TRACT | Status: DC
  Administered 2023-07-01 – 2023-07-04 (×4): 175 ug via RESPIRATORY_TRACT
  Filled 2023-06-30 (×4): qty 3

## 2023-06-30 MED ORDER — FENTANYL CITRATE PF 50 MCG/ML IJ SOSY
50.0000 ug | PREFILLED_SYRINGE | INTRAMUSCULAR | Status: DC | PRN
  Filled 2023-06-30: qty 1

## 2023-06-30 MED ORDER — POLYETHYLENE GLYCOL 3350 17 G PO PACK: 17.0000 g | PACK | Freq: Every day | ORAL | Status: DC | PRN

## 2023-06-30 MED ORDER — ETOMIDATE 2 MG/ML IV SOLN
INTRAVENOUS | Status: DC | PRN
  Administered 2023-06-30: 20 mg via INTRAVENOUS

## 2023-06-30 MED ORDER — ONDANSETRON HCL 4 MG/2ML IJ SOLN
4.0000 mg | Freq: Four times a day (QID) | INTRAMUSCULAR | Status: DC | PRN
  Administered 2023-07-03: 4 mg via INTRAVENOUS
  Filled 2023-06-30: qty 2

## 2023-06-30 MED ORDER — FAMOTIDINE 20 MG PO TABS: 20.0000 mg | ORAL_TABLET | Freq: Two times a day (BID) | ORAL | Status: DC

## 2023-06-30 MED ORDER — DOCUSATE SODIUM 50 MG/5ML PO LIQD
100.0000 mg | Freq: Two times a day (BID) | ORAL | Status: DC
  Administered 2023-06-30 – 2023-07-03 (×8): 100 mg
  Filled 2023-06-30 (×8): qty 10

## 2023-06-30 MED ORDER — CHLORHEXIDINE GLUCONATE CLOTH 2 % EX PADS
6.0000 | MEDICATED_PAD | Freq: Every day | CUTANEOUS | Status: DC
Start: 2023-06-30 — End: 2023-07-04
  Administered 2023-06-30 – 2023-07-03 (×4): 6 via TOPICAL

## 2023-06-30 MED ORDER — METHYLPREDNISOLONE SODIUM SUCC 40 MG IJ SOLR
40.0000 mg | Freq: Two times a day (BID) | INTRAMUSCULAR | Status: AC
  Administered 2023-06-30 – 2023-07-03 (×6): 40 mg via INTRAVENOUS
  Filled 2023-06-30 (×6): qty 1

## 2023-06-30 MED ORDER — ACETAMINOPHEN 160 MG/5ML PO SOLN
650.0000 mg | ORAL | Status: DC | PRN
  Administered 2023-07-02 – 2023-07-04 (×6): 650 mg
  Filled 2023-06-30 (×6): qty 20.3

## 2023-06-30 MED ORDER — SODIUM CHLORIDE 0.9 % IV SOLN: INTRAVENOUS | Status: AC

## 2023-06-30 NOTE — ED Triage Notes (Signed)
Pt BIB EMS due to a post CPR. Pt was walking up stairs and got winded. Pt laid in bed and EMS found pt unresponsive. EMS did 15 min of CPR. No shocks. PEA, gave 2 epis. EMS arrives opacing pt at 60 bpm.

## 2023-06-30 NOTE — Care Plan (Signed)
LTM EEG reviewed till 61. Myoclonic seizures very few minutes. ICU team notified. Please review final report for details   Tashay Bozich Annabelle Harman

## 2023-06-30 NOTE — Progress Notes (Signed)
Responded to page to support patient and wife Bjorn Loser) at bedside. Pt is unresponsive and going to CT. Per dr. Patient looks OK.  Chaplain available as needed.  Venida Jarvis, [place], [facility] Pager [phone]

## 2023-06-30 NOTE — H&P (Signed)
NAME:  Robert [name], MRN:  [id number], DOB:  1960/09/22, LOS: 0 ADMISSION DATE:  06/30/2023, CONSULTATION DATE: 06/30/2023 REFERRING MD: Urgency department physician, CHIEF COMPLAINT: Cardiac arrest  History of Present Illness:  Robert [name] is a 62 year old male former smoker with known COPD that is oxygen dependent and is followed by Dr. Craige Cotta in the [facility].  He was in his usual state of poor health with shortness of breath and after walking up a set of steps he asked his wife for breathing treatment and while administering the breathing treatment she noticed that he became unconscious.  He maintained a pulse until EMS arrived at which time no pulse was detected.  Therefore PEA protocol was implemented with chest compressions bagging and after epinephrine was administered he was transferred to [facility] and intubated.  Initially was on epinephrine drip Levophed drip but now is on no vasopressor support at all.  Extremely acidotic with a pH of 7.1 for which she was given bicarbonate x 2 and respiratory rate was increased to 34.  He is off sedation and is not following commands.  He will need a head CT and possibly further neurological evaluations.  He has orders written to go to the coronary intensive care unit.  Pertinent  Medical History   Past Medical History:  Diagnosis Date   ACE-inhibitor cough    Acute respiratory failure [facility]) January 2015   2nd to AECOPD, required intubation   COPD with emphysema (HCC)        Hiatal hernia    Hyperlipidemia    Hypertension    Nephrolithiasis    Nocturnal hypoxemia due to emphysema [facility])    Pulmonary nodule, right 12/10/2010   Seasonal allergies      Significant Hospital Events: Including procedures, antibiotic start and stop dates in addition to other pertinent events     Interim History / Subjective:  Status post 45 minutes of CPR with witnessed cardiac arrest  Objective   Blood pressure 109/67, pulse 89, temperature (!)  96.4 F (35.8 C), resp. rate (!) 22, height 5\' 6"  (1.676 m), weight 113 kg, SpO2 100%.       No intake or output data in the 24 hours ending 06/30/23 1056 Filed Weights   06/30/23 1033  Weight: 113 kg    Examination: General: Elderly male who is unresponsive HENT: Endotracheal tube and orogastric tube are in place Lungs: Coarse rhonchi bilaterally Cardiovascular: Sounds are regular Abdomen: Abdomen soft nontender Extremities: Remedies with 2-3+ edema Neuro: Does not respond to noxious stimuli GU: Amber urine  Resolved Hospital Problem list     Assessment & Plan:  Cardiac arrest witnessed by wife, 45 minutes of CPR per emergency medical services with transportation to the emergency department intubation and return of spontaneous circulation after being treated with pacemaker epinephrine and Levophed drip.  Suspect this may be cardiac arrest secondary to respiratory failure.  Cardiac arrest with a history of O2 dependent COPD followed by Dr.Sood Management ventilator Increased respiratory rate to 32 to for a pCO2 of 86 2 Amp of bicarb for pH of 7.1 Repeat ABG Cardiology consult  Prolonged downtime of 40 minutes of CPR CT of the head Frequent neurological evaluations May need neurology consult May need MRI  O2 dependent COPD O2 as needed Brocco dilators Sputum culture Serial chest x-rays  Questionable component of CHF Once resuscitated may need diuresis 2D echo has been performed    Diabetes mellitus Sliding scale insulin protocol  Best Practice (right click and "Reselect all SmartList Selections" daily)   Diet/type: NPO DVT prophylaxis: Place TED hose Start: 06/30/23 1051 SCDs Start: 06/30/23 1049   Pressure ulcer(s): not present on admission  GI prophylaxis: PPI Lines: N/A Foley:  N/A Code Status:  full code Last date of multidisciplinary goals of care discussion [TBD Family updated 06/30/23]  Labs   CBC: Recent Labs  Lab  06/30/23 1003  WBC 10.6*  HGB 13.6  HCT 46.9  MCV 99.2  PLT 325    Basic Metabolic Panel: No results for input(s): "NA", "K", "CL", "CO2", "GLUCOSE", "BUN", "CREATININE", "CALCIUM", "MG", "PHOS" in the last 168 hours. GFR: CrCl cannot be calculated (Patient's most recent lab result is older than the maximum 21 days allowed.). Recent Labs  Lab 06/30/23 1003 06/30/23 1014  WBC 10.6*  --   LATICACIDVEN  --  6.2*    Liver Function Tests: No results for input(s): "AST", "ALT", "ALKPHOS", "BILITOT", "PROT", "ALBUMIN" in the last 168 hours. No results for input(s): "LIPASE", "AMYLASE" in the last 168 hours. No results for input(s): "AMMONIA" in the last 168 hours.  ABG    Component Value Date/Time   PHART 7.349 (L) 01/28/2022 0545   PCO2ART 73.7 (HH) 01/28/2022 0545   PO2ART 40 (LL) 01/28/2022 0545   HCO3 40.6 (H) 01/28/2022 0545   TCO2 43 (H) 01/28/2022 0545   O2SAT 70 01/28/2022 0545     Coagulation Profile: Recent Labs  Lab 06/30/23 1003  INR 1.3*    Cardiac Enzymes: No results for input(s): "CKTOTAL", "CKMB", "CKMBINDEX", "TROPONINI" in the last 168 hours.  HbA1C: Hgb A1c MFr Bld  Date/Time Value Ref Range Status  10/27/2021 11:20 PM 5.8 (H) 4.8 - 5.6 % Final    Comment:    (NOTE)         Prediabetes: 5.7 - 6.4         Diabetes: >6.4         Glycemic control for adults with diabetes: <7.0     CBG: Recent Labs  Lab 06/30/23 1041  GLUCAP 141*    Review of Systems:   na  Past Medical History:  He,  has a past medical history of ACE-inhibitor cough, Acute respiratory failure [facility]) (January 2015), COPD with emphysema (HCC), Hiatal hernia, Hyperlipidemia, Hypertension, Nephrolithiasis, Nocturnal hypoxemia due to emphysema [facility]), Pulmonary nodule, right (12/10/2010), and Seasonal allergies.   Surgical History:   Past Surgical History:  Procedure Laterality Date   KNEE ARTHROSCOPY Right 2005   x 2 -had one in the service.     Social History:    reports that he quit smoking about 31 years ago. His smoking use included cigarettes. He started smoking about 47 years ago. He has a 24 pack-year smoking history. He has never used smokeless tobacco. He reports that he does not drink alcohol and does not use drugs.   Family History:  His family history includes Heart disease in his sister; Pancreatic cancer in his father.   Allergies Allergies  Allergen Reactions   Ace Inhibitors Cough   Aspirin Other (See Comments)    Pt does not remember reaction   Latex Other (See Comments)    Pt does not remember reaction     Home Medications  Prior to Admission medications   Medication Sig Start Date End Date Taking? Authorizing Provider  albuterol (VENTOLIN HFA) 108 (90 Base) MCG/ACT inhaler Inhale 2 puffs into the lungs every 6 (six) hours as needed for wheezing or shortness of breath.  10/29/21   Lorin Glass, MD  amLODipine (NORVASC) 10 MG tablet Take 5 mg by mouth daily. 03/27/21   [provider]  atorvastatin (LIPITOR) 80 MG tablet Take 80 mg by mouth daily.    [provider]  cetirizine (ZYRTEC) 10 MG tablet Take 10 mg by mouth daily.      [provider]  Cholecalciferol 25 MCG (1000 UT) tablet Take 2,000 Units by mouth daily. 03/27/21   [provider]  clotrimazole (LOTRIMIN) 1 % cream Apply 1 application. topically daily. 02/04/21   [provider]  COD LIVER OIL PO Take 1 capsule by mouth daily.    [provider]  fluticasone (FLONASE) 50 MCG/ACT nasal spray Place 2 sprays into both nostrils daily. 03/27/21   [provider]  fluticasone-salmeterol (ADVAIR) 250-50 MCG/ACT AEPB Inhale 1 puff into the lungs in the morning and at bedtime. 10/29/21 01/28/22  Lorin Glass, MD  hydrochlorothiazide (HYDRODIURIL) 25 MG tablet Take 25 mg by mouth daily. 04/03/15   [provider]  ipratropium-albuterol (DUONEB) 0.5-2.5 (3) MG/3ML SOLN Take 3 mLs by nebulization every 4 (four) hours as  needed. 01/30/22   Osvaldo Shipper, MD  ketoconazole (NIZORAL) 2 % shampoo Apply 1 application. topically daily. 03/18/21   [provider]  losartan (COZAAR) 100 MG tablet Take 100 mg by mouth daily.      [provider]  metFORMIN (GLUCOPHAGE-XR) 500 MG 24 hr tablet Take 500 mg by mouth every other day. 03/27/21   [provider]  Omega-3 Fatty Acids (FISH OIL) 1000 MG CAPS Take 1,000 mg by mouth 2 (two) times daily.    [provider]  pantoprazole (PROTONIX) 40 MG tablet Take 40 mg by mouth 2 (two) times daily.    [provider]  pioglitazone (ACTOS) 30 MG tablet Take 30 mg by mouth daily. 12/22/20   [provider]  predniSONE (DELTASONE) 20 MG tablet Take 3 tablets once daily for 3 days followed by 2 tablets once daily for 3 days followed by 1 tablet once daily for 3 days and then stop 01/30/22   Osvaldo Shipper, MD  tiotropium (SPIRIVA) 18 MCG inhalation capsule Place 1 capsule (18 mcg total) into inhaler and inhale daily. 11/16/10   [name], MD  vitamin E 180 MG (400 UNITS) capsule Take 400 Units by mouth daily.    [provider]     Critical care time: 45 min   Brett Canales Taino Maertens ACNP Acute Care Nurse Practitioner [facility] Pul[facility]/Critical Care Please consult Amion 06/30/2023, 10:56 AM

## 2023-06-30 NOTE — ED Notes (Signed)
Pt post CPR to Trauma B with igel in place. Dr. Jean Rosenthal removed igel and pt intubated x1 attempt without complication.

## 2023-06-30 NOTE — Progress Notes (Signed)
Pt transported on the ventilator from Trauma B to CT1 then from CT1 to 2M04 without complication. RT and RN accompanied pt. Report given to ICU RT.

## 2023-06-30 NOTE — Consult Note (Signed)
Cardiology Consultation   Patient ID: Randi Mehlhorn MRN: [id number]; DOB: Jun 07, 1961  Admit date: 06/30/2023 Date of Consult: 06/30/2023  PCP:  [name], MD (Inactive)   [facility] Providers Cardiologist:  None   New this Admission    Patient Profile:   Otho Rodela is a 62 y.o. male with a hx of COPD on home oxygen, HLD, HTN, former tobacco use who is being seen 06/30/2023 for the evaluation of PEA arrest at the request of Dr. Denese Killings.  History of Present Illness:   Mr. Knuckles is a 62 year old male with above medical history who obtains majority of his care through the [place]. He has COPD and is oxygen dependent. Patient's sister reports that patient does not have any past cardiac history.   Today, patient was in his usual state of health, he walked up a set of steps and became very short of breath. He asked his wife for a breathing treatment, and while administering the breathing treatment he became unconscious. His wife called EMS. Reportedly, patient maintained a pulse until EMS arrived and they could not detect a pulse. EMS started CPR and administered 2 doses of epinephrine. He had ROSC after 45 minutes. He was taken to [facility] and was intubated. He had marked acidosis on first ABG. Once this was corrected, he was weaned off vasopressors. Neurology and cardiology have been consulted.   Echocardiogram on 12/5 showed EF 35-40%, global hypokinesis, mildly reduced RV function. CTA chest showed No PE, ground-glass opacities throughout both lungs that are nonspecific but could be seen with atypical pneumonia, other forms of interstitial pneumonitis. 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   At this time, patient remains intubated. Patient having frequent myoclonic jerks on exam   Past Medical History:  Diagnosis Date   ACE-inhibitor cough    Acute respiratory failure [facility]) January 2015   2nd to AECOPD, required intubation   COPD with emphysema  (HCC)        Hiatal hernia    Hyperlipidemia    Hypertension    Nephrolithiasis    Nocturnal hypoxemia due to emphysema [facility])    Pulmonary nodule, right 12/10/2010   Seasonal allergies     Past Surgical History:  Procedure Laterality Date   KNEE ARTHROSCOPY Right 2005   x 2 -had one in the service.     Home Medications:  Prior to Admission medications   Medication Sig Start Date End Date Taking? Authorizing Provider  albuterol (VENTOLIN HFA) 108 (90 Base) MCG/ACT inhaler Inhale 2 puffs into the lungs every 6 (six) hours as needed for wheezing or shortness of breath. 10/29/21   Lorin Glass, MD  amLODipine (NORVASC) 10 MG tablet Take 5 mg by mouth daily. 03/27/21   [provider]  amoxicillin (AMOXIL) 500 MG capsule Take 500 mg by mouth every 8 (eight) hours. UTI 06/29/23   [provider]  atorvastatin (LIPITOR) 80 MG tablet Take 80 mg by mouth daily.    [provider]  Capsaicin 0.1 % CREA Apply 1 Application topically daily. Neuropathy pain 06/27/23   [provider]  cetirizine (ZYRTEC) 10 MG tablet Take 10 mg by mouth daily.      [provider]  Cholecalciferol 25 MCG (1000 UT) tablet Take 2,000 Units by mouth daily. 03/27/21   [provider]  clotrimazole (LOTRIMIN) 1 % cream Apply 1 application. topically daily. 02/04/21   [provider]  COD LIVER OIL PO Take 1 capsule by mouth  daily.    [provider]  fluticasone (FLONASE) 50 MCG/ACT nasal spray Place 2 sprays into both nostrils daily. 03/27/21   [provider]  fluticasone-salmeterol (ADVAIR) 250-50 MCG/ACT AEPB Inhale 1 puff into the lungs in the morning and at bedtime. 10/29/21 01/28/22  Dahal, Melina Schools, MD  glipiZIDE (GLUCOTROL) 10 MG tablet Take 10 mg by mouth 2 (two) times daily. 12/29/22   [provider]  hydrochlorothiazide (HYDRODIURIL) 25 MG tablet Take 25 mg by mouth daily. 04/03/15   [provider]  HYDROcodone-acetaminophen  (NORCO/VICODIN) 5-325 MG tablet Take 1 tablet by mouth 3 (three) times daily as needed for moderate pain (pain score 4-6). 06/27/23   [provider]  ipratropium-albuterol (DUONEB) 0.5-2.5 (3) MG/3ML SOLN Take 3 mLs by nebulization every 4 (four) hours as needed. 01/30/22   Osvaldo Shipper, MD  ketoconazole (NIZORAL) 2 % shampoo Apply 1 application. topically daily. 03/18/21   [provider]  losartan (COZAAR) 100 MG tablet Take 100 mg by mouth daily.      [provider]  metFORMIN (GLUCOPHAGE-XR) 500 MG 24 hr tablet Take 500 mg by mouth every other day. 03/27/21   [provider]  metFORMIN (GLUCOPHAGE-XR) 500 MG 24 hr tablet Take 1,000 mg by mouth 2 (two) times daily. 12/29/22   [provider]  mirtazapine (REMERON) 30 MG tablet Take 1 tablet by mouth at bedtime. 12/29/22   [provider]  Omega-3 Fatty Acids (FISH OIL) 1000 MG CAPS Take 1,000 mg by mouth 2 (two) times daily.    [provider]  pantoprazole (PROTONIX) 40 MG tablet Take 40 mg by mouth 2 (two) times daily.    [provider]  pioglitazone (ACTOS) 30 MG tablet Take 30 mg by mouth daily. 12/22/20   [provider]  pioglitazone (ACTOS) 45 MG tablet Take 45 mg by mouth daily. 12/29/22   [provider]  predniSONE (DELTASONE) 20 MG tablet Take 3 tablets once daily for 3 days followed by 2 tablets once daily for 3 days followed by 1 tablet once daily for 3 days and then stop 01/30/22   Osvaldo Shipper, MD  sildenafil (VIAGRA) 50 MG tablet Take 25 mg by mouth daily as needed for erectile dysfunction. 12/29/22   [provider]  tiotropium (SPIRIVA) 18 MCG inhalation capsule Place 1 capsule (18 mcg total) into inhaler and inhale daily. 11/16/10   [name], MD  vitamin E 180 MG (400 UNITS) capsule Take 400 Units by mouth daily.    [provider]    Inpatient Medications: Scheduled Meds:  acetaminophen  650 mg Per Tube Q4H   Or    acetaminophen (TYLENOL) oral liquid 160 mg/5 mL  650 mg Per Tube Q4H   Or   acetaminophen  650 mg Rectal Q4H   arformoterol  15 mcg Nebulization BID   budesonide (PULMICORT) nebulizer solution  0.25 mg Nebulization BID   Chlorhexidine Gluconate Cloth  6 each Topical Daily   docusate  100 mg Per Tube BID   famotidine  20 mg Per Tube BID   heparin  5,000 Units Subcutaneous Q8H   methylPREDNISolone (SOLU-MEDROL) injection  40 mg Intravenous Q12H   Followed by   Melene Muller ON 07/03/2023] methylPREDNISolone (SOLU-MEDROL) injection  40 mg Intravenous Daily   mouth rinse  15 mL Mouth Rinse Q2H   pantoprazole (PROTONIX) IV  40 mg Intravenous QHS   polyethylene glycol  17 g Per Tube Daily   revefenacin  175 mcg Nebulization Daily  Continuous Infusions:  sodium chloride 100 mL/hr at 06/30/23 1300   sodium chloride     sodium chloride     magnesium sulfate     norepinephrine (LEVOPHED) Adult infusion     propofol (DIPRIVAN) infusion     valproate sodium     [START ON 07/01/2023] valproate sodium     PRN Meds: [START ON 07/02/2023] acetaminophen **OR** [START ON 07/02/2023] acetaminophen (TYLENOL) oral liquid 160 mg/5 mL **OR** [START ON 07/02/2023] acetaminophen, busPIRone **OR** busPIRone, docusate, fentaNYL (SUBLIMAZE) injection, fentaNYL (SUBLIMAZE) injection, magnesium sulfate, midazolam, ondansetron (ZOFRAN) IV, mouth rinse, polyethylene glycol  Allergies:    Allergies  Allergen Reactions   Ace Inhibitors Cough   Aspirin Other (See Comments)    Pt does not remember reaction   Latex Other (See Comments)    Pt does not remember reaction    Social History:   Social History   Socioeconomic History   Marital status: Married    Spouse name: Not on file   Number of children: Not on file   Years of education: Not on file   Highest education level: Not on file  Occupational History   Not on file  Tobacco Use   Smoking status: Former    Current packs/day: 0.00    Average packs/day: 1.5  packs/day for 16.0 years (24.0 ttl pk-yrs)    Types: Cigarettes    Start date: 10/25/1975    Quit date: 10/25/1991    Years since quitting: 31.7   Smokeless tobacco: Never  Substance and Sexual Activity   Alcohol use: No    Comment: rare   Drug use: No   Sexual activity: Not on file  Other Topics Concern   Not on file  Social History Narrative   [employer] veteran 1983 to 5 - [place] 1985 to 1989, [place] September 1990 to November 1991 and February 1991 to August 1991.   Social Determinants of Health   Financial Resource Strain: Not on file  Food Insecurity: Not on file  Transportation Needs: Not on file  Physical Activity: Not on file  Stress: Not on file  Social Connections: Unknown (12/08/2021)   Received from [facility], [facility]   Social Network    Social Network: Not on file  Intimate Partner Violence: Unknown (10/30/2021)   Received from [facility], [facility]   HITS    Physically Hurt: Not on file    Insult or Talk Down To: Not on file    Threaten Physical Harm: Not on file    Scream or Curse: Not on file    Family History:   Family History  Problem Relation Age of Onset   Pancreatic cancer Father    Heart disease Sister      ROS:  Please see the history of present illness.   All other ROS reviewed and negative.     Physical Exam/Data:   Vitals:   06/30/23 1154 06/30/23 1155 06/30/23 1245 06/30/23 1300  BP: 120/64  (!) 114/102 129/73  Pulse: 70 61 60 (!) 59  Resp: (!) 51 (!) 32 (!) 28 (!) 32  Temp: (!) 96.3 F (35.7 C) (!) 96.3 F (35.7 C)    SpO2: 100% 100% 100% 100%  Weight:      Height:        Intake/Output Summary (Last 24 hours) at 06/30/2023 1423 Last data filed at 06/30/2023 1300 Gross per 24 hour  Intake 1163.42 ml  Output --  Net 1163.42 ml  06/30/2023   10:33 AM 01/28/2022    7:10 PM 01/19/2021    3:00 AM  Last 3 Weights  Weight (lbs) 249 lb 1.9 oz 243 lb 13.3 oz 264 lb 8.8 oz  Weight (kg) 113 kg 110.6 kg 120 kg      Body mass index is 40.21 kg/m.   At the time of my evaluation, patient was having ABG drawn. Exam limited. Patient is intubated, does not respond to voice or painful stimuli. Has frequent myoclonic jerks.   EKG:  The EKG was personally reviewed and demonstrates:  sinus rhythm, HR 81 BPM  Telemetry:  Telemetry was personally reviewed and demonstrates:  NSR, rare PVCs  Relevant CV Studies: Cardiac Studies & Procedures       ECHOCARDIOGRAM  ECHOCARDIOGRAM COMPLETE 06/30/2023  Narrative ECHOCARDIOGRAM REPORT    Patient Name:   JULIENNE BERNSON Date of Exam: 06/30/2023 Medical Rec #:  [id number]       Height:       66.0 in Accession #:    [id number]      Weight:       243.8 lb Date of Birth:  Jul 12, 1961       BSA:          2.176 m Patient Age:    62 years        BP:           155/91 mmHg Patient Gender: M               HR:           111 bpm. Exam Location:  Inpatient  Procedure: 2D Echo, Color Doppler and Cardiac Doppler  STAT ECHO  Indications:    I42.9 Cardiomyopathy (unspecified)  History:        Patient has no prior history of Echocardiogram examinations. COPD; Risk Factors:Hypertension and Dyslipidemia.  Sonographer:    Irving Burton Senior RDCS Referring Phys: [phone] ROBERT LOCKWOOD   Sonographer Comments: STAT post arrest IMPRESSIONS   1. Left ventricular ejection fraction, by estimation, is 35 to 40%. The left ventricle has moderately decreased function. The left ventricle demonstrates global hypokinesis. The left ventricular internal cavity size was mildly dilated. There is mild concentric left ventricular hypertrophy. Left ventricular diastolic parameters are indeterminate. 2. Right ventricular systolic function is mildly reduced. The right ventricular size is mildly enlarged. Tricuspid regurgitation signal is inadequate for assessing PA pressure. 3. Left atrial size was mildly dilated. 4. Right atrial size was mildly dilated. 5. The mitral valve is normal in structure.  Trivial mitral valve regurgitation. 6. The aortic valve is tricuspid. There is mild calcification of the aortic valve. Aortic valve regurgitation is not visualized. Aortic valve sclerosis is present, with no evidence of aortic valve stenosis. 7. Aortic Ascending aorta appears normal in size and structure. There is borderline dilatation of the aortic root, measuring 39 mm.  FINDINGS Left Ventricle: Left ventricular ejection fraction, by estimation, is 35 to 40%. The left ventricle has moderately decreased function. The left ventricle demonstrates global hypokinesis. The left ventricular internal cavity size was mildly dilated. There is mild concentric left ventricular hypertrophy. Left ventricular diastolic parameters are indeterminate.  Right Ventricle: The right ventricular size is mildly enlarged. No increase in right ventricular wall thickness. Right ventricular systolic function is mildly reduced. Tricuspid regurgitation signal is inadequate for assessing PA pressure.  Left Atrium: Left atrial size was mildly dilated.  Right Atrium: Right atrial size was mildly dilated.  Pericardium: There is no evidence of pericardial effusion.  Mitral Valve: The mitral valve is normal in structure. Trivial mitral valve regurgitation.  Tricuspid Valve: The tricuspid valve is normal in structure. Tricuspid valve regurgitation is trivial.  Aortic Valve: The aortic valve is tricuspid. There is mild calcification of the aortic valve. Aortic valve regurgitation is not visualized. Aortic valve sclerosis is present, with no evidence of aortic valve stenosis.  Pulmonic Valve: The pulmonic valve was normal in structure. Pulmonic valve regurgitation is mild.  Aorta: Ascending aorta appears normal in size and structure. There is borderline dilatation of the aortic root, measuring 39 mm.  Venous: IVC assessment for right atrial pressure unable to be performed due to mechanical ventilation.  IAS/Shunts: No atrial  level shunt detected by color flow Doppler.   LEFT VENTRICLE PLAX 2D LVIDd:         5.00 cm LVIDs:         4.30 cm LV PW:         1.20 cm LV IVS:        0.95 cm LVOT diam:     2.20 cm LV SV:         65 LV SV Index:   30 LVOT Area:     3.80 cm  LV Volumes (MOD) LV vol d, MOD A2C: 175.0 ml LV vol d, MOD A4C: 130.0 ml LV vol s, MOD A2C: 105.0 ml LV vol s, MOD A4C: 83.0 ml LV SV MOD A2C:     70.0 ml LV SV MOD A4C:     130.0 ml LV SV MOD BP:      61.7 ml  RIGHT VENTRICLE RV S prime:     10.30 cm/s TAPSE (M-mode): 2.2 cm  LEFT ATRIUM             Index        RIGHT ATRIUM           Index LA diam:        3.85 cm 1.77 cm/m   RA Area:     23.90 cm LA Vol (A2C):   66.9 ml 30.75 ml/m  RA Volume:   75.20 ml  34.57 ml/m LA Vol (A4C):   79.9 ml 36.73 ml/m LA Biplane Vol: 76.3 ml 35.07 ml/m AORTIC VALVE LVOT Vmax:   96.60 cm/s LVOT Vmean:  69.100 cm/s LVOT VTI:    0.172 m  AORTA Ao Root diam: 3.85 cm   SHUNTS Systemic VTI:  0.17 m Systemic Diam: 2.20 cm  Clearnce Hasten Electronically signed by Clearnce Hasten Signature Date/Time: 06/30/2023/10:46:37 AM    Final              Laboratory Data:  High Sensitivity Troponin:   Recent Labs  Lab 06/30/23 1003  TROPONINIHS 37*     Chemistry Recent Labs  Lab 06/30/23 1003 06/30/23 1102  NA 139 139  K 4.6 5.4*  CL 92*  --   CO2 33*  --   GLUCOSE 191*  --   BUN 15  --   CREATININE 1.32*  --   CALCIUM 8.6*  --   MG 2.1  --   GFRNONAA >60  --   ANIONGAP 14  --     Recent Labs  Lab 06/30/23 1003  PROT 7.0  ALBUMIN 3.8  AST 31  ALT 17  ALKPHOS 75  BILITOT 1.2*   Lipids  Recent Labs  Lab 06/30/23 1003  TRIG 56    Hematology Recent Labs  Lab 06/30/23 1003 06/30/23 1102  WBC 10.6*  --  RBC 4.73  --   HGB 13.6 13.6  HCT 46.9 40.0  MCV 99.2  --   MCH 28.8  --   MCHC 29.0*  --   RDW 14.5  --   PLT 325  --    Thyroid No results for input(s): "TSH", "FREET4" in the last 168 hours.  BNPNo  results for input(s): "BNP", "PROBNP" in the last 168 hours.  DDimer No results for input(s): "DDIMER" in the last 168 hours.   Radiology/Studies:  CT HEAD WO CONTRAST  Result Date: 06/30/2023 CLINICAL DATA:  Mental status change, unknown cause EXAM: CT HEAD WITHOUT CONTRAST TECHNIQUE: Contiguous axial images were obtained from the base of the skull through the vertex without intravenous contrast. RADIATION DOSE REDUCTION: This exam was performed according to the departmental dose-optimization program which includes automated exposure control, adjustment of the mA and/or kV according to patient size and/or use of iterative reconstruction technique. COMPARISON:  None Available. FINDINGS: Brain: No hemorrhage. No hydrocephalus. No extra-axial fluid collection. No mass effect. No mass lesion. The fourth ventricle is somewhat small in size, which may be secondary to cerebellar edema. No other finding to definitively suggest anoxic brain injury the time of the exam. Vascular: No hyperdense vessel or unexpected calcification. Skull: Normal. Negative for fracture or focal lesion. Sinuses/Orbits: No middle ear or mastoid effusion. Paranasal sinuses are notable for pansinus mucosal thickening with air-fluid levels in bilateral maxillary sinuses. Orbits are unremarkable. Other: None. IMPRESSION: The fourth ventricle is somewhat small in size, which may be secondary to cerebellar edema. No other finding to definitively suggest anoxic brain injury at the time of the exam. Recommend brain MRI for further evaluation. Electronically Signed   By: Lorenza Cambridge M.D.   On: 06/30/2023 13:05   DG Abd Portable 1V  Result Date: 06/30/2023 CLINICAL DATA:  Tube placement EXAM: PORTABLE ABDOMEN - 1 VIEW limited for tube placement COMPARISON:  None Available. FINDINGS: Enteric tube in place with tip overlying the distal stomach. Elsewhere on this limited supine view of the abdomen there is some distended air-filled loops of small  bowel in the midabdomen. Colonic stool. IMPRESSION: Enteric tube seen along the distal stomach on this limited upright portable x-ray Electronically Signed   By: Karen Kays M.D.   On: 06/30/2023 11:09   DG Chest Port 1 View  Result Date: 06/30/2023 CLINICAL DATA:  Post intubation EXAM: PORTABLE CHEST 1 VIEW COMPARISON:  CXR 01/27/22 FINDINGS: Endotracheal tube terminates 7 cm above the carina. Enteric tube courses below diaphragm with the side hole and tip projecting over the expected location of the stomach. No pleural effusion. No pneumothorax. Cardiomegaly. Patchy bilateral airspace opacities are nonspecific represent pulmonary edema or multifocal infection. No radiographically apparent displaced rib fractures. Visualized upper abdomen is unremarkable. IMPRESSION: 1. Endotracheal tube terminates 7 cm above the carina. 2. Patchy bilateral airspace opacities are nonspecific represent pulmonary edema or multifocal infection. Electronically Signed   By: Lorenza Cambridge M.D.   On: 06/30/2023 11:05   ECHOCARDIOGRAM COMPLETE  Result Date: 06/30/2023    ECHOCARDIOGRAM REPORT   Patient Name:   GEORGES ATNIP Date of Exam: 06/30/2023 Medical Rec #:  [id number]       Height:       66.0 in Accession #:    [id number]      Weight:       243.8 lb Date of Birth:  02/26/1961       BSA:          2.176 m  Patient Age:    62 years        BP:           155/91 mmHg Patient Gender: M               HR:           111 bpm. Exam Location:  Inpatient Procedure: 2D Echo, Color Doppler and Cardiac Doppler STAT ECHO Indications:    I42.9 Cardiomyopathy (unspecified)  History:        Patient has no prior history of Echocardiogram examinations.                 COPD; Risk Factors:Hypertension and Dyslipidemia.  Sonographer:    Irving Burton Senior RDCS Referring Phys: [phone] ROBERT LOCKWOOD  Sonographer Comments: STAT post arrest IMPRESSIONS  1. Left ventricular ejection fraction, by estimation, is 35 to 40%. The left ventricle has moderately decreased  function. The left ventricle demonstrates global hypokinesis. The left ventricular internal cavity size was mildly dilated. There is mild concentric left ventricular hypertrophy. Left ventricular diastolic parameters are indeterminate.  2. Right ventricular systolic function is mildly reduced. The right ventricular size is mildly enlarged. Tricuspid regurgitation signal is inadequate for assessing PA pressure.  3. Left atrial size was mildly dilated.  4. Right atrial size was mildly dilated.  5. The mitral valve is normal in structure. Trivial mitral valve regurgitation.  6. The aortic valve is tricuspid. There is mild calcification of the aortic valve. Aortic valve regurgitation is not visualized. Aortic valve sclerosis is present, with no evidence of aortic valve stenosis.  7. Aortic Ascending aorta appears normal in size and structure. There is borderline dilatation of the aortic root, measuring 39 mm. FINDINGS  Left Ventricle: Left ventricular ejection fraction, by estimation, is 35 to 40%. The left ventricle has moderately decreased function. The left ventricle demonstrates global hypokinesis. The left ventricular internal cavity size was mildly dilated. There is mild concentric left ventricular hypertrophy. Left ventricular diastolic parameters are indeterminate. Right Ventricle: The right ventricular size is mildly enlarged. No increase in right ventricular wall thickness. Right ventricular systolic function is mildly reduced. Tricuspid regurgitation signal is inadequate for assessing PA pressure. Left Atrium: Left atrial size was mildly dilated. Right Atrium: Right atrial size was mildly dilated. Pericardium: There is no evidence of pericardial effusion. Mitral Valve: The mitral valve is normal in structure. Trivial mitral valve regurgitation. Tricuspid Valve: The tricuspid valve is normal in structure. Tricuspid valve regurgitation is trivial. Aortic Valve: The aortic valve is tricuspid. There is mild  calcification of the aortic valve. Aortic valve regurgitation is not visualized. Aortic valve sclerosis is present, with no evidence of aortic valve stenosis. Pulmonic Valve: The pulmonic valve was normal in structure. Pulmonic valve regurgitation is mild. Aorta: Ascending aorta appears normal in size and structure. There is borderline dilatation of the aortic root, measuring 39 mm. Venous: IVC assessment for right atrial pressure unable to be performed due to mechanical ventilation. IAS/Shunts: No atrial level shunt detected by color flow Doppler.  LEFT VENTRICLE PLAX 2D LVIDd:         5.00 cm LVIDs:         4.30 cm LV PW:         1.20 cm LV IVS:        0.95 cm LVOT diam:     2.20 cm LV SV:         65 LV SV Index:   30 LVOT Area:     3.80  cm  LV Volumes (MOD) LV vol d, MOD A2C: 175.0 ml LV vol d, MOD A4C: 130.0 ml LV vol s, MOD A2C: 105.0 ml LV vol s, MOD A4C: 83.0 ml LV SV MOD A2C:     70.0 ml LV SV MOD A4C:     130.0 ml LV SV MOD BP:      61.7 ml RIGHT VENTRICLE RV S prime:     10.30 cm/s TAPSE (M-mode): 2.2 cm LEFT ATRIUM             Index        RIGHT ATRIUM           Index LA diam:        3.85 cm 1.77 cm/m   RA Area:     23.90 cm LA Vol (A2C):   66.9 ml 30.75 ml/m  RA Volume:   75.20 ml  34.57 ml/m LA Vol (A4C):   79.9 ml 36.73 ml/m LA Biplane Vol: 76.3 ml 35.07 ml/m  AORTIC VALVE LVOT Vmax:   96.60 cm/s LVOT Vmean:  69.100 cm/s LVOT VTI:    0.172 m  AORTA Ao Root diam: 3.85 cm  SHUNTS Systemic VTI:  0.17 m Systemic Diam: 2.20 cm Clearnce Hasten Electronically signed by Clearnce Hasten Signature Date/Time: 06/30/2023/10:46:37 AM    Final      Assessment and Plan:   PEA Arrest  - Patient has known COPD and is oxygen dependent at home. He reportedly walked upstairs today, got short of breath, and lost consciousness while administering a breathing treatment. Suspect pulmonary cause of arrest - Received 45 minutes of CPR, 2 doses of epi in the field. Now remains sedated, intubated. Has myoclonus on  exam. Was markedly acidotic on initial ABG - Critical care treating as COPD and correcting hypercarbia- on bronchodilators, steroids   Acute Systolic Heart Failure  - Per family, patient does not have any past cardiac history - Echocardiogram this admission showed EF 35-40%, mild LVH, mildly reduced RV function  - Suspect stress cardiomyopathy. If patient shows signs of neurologic recover, can consider ischemic evaluation in the future    Risk Assessment/Risk Scores:   For questions or updates, please contact [facility] Please consult [email or web address] for contact info under    Signed, Jonita Albee, PA-C  06/30/2023 2:23 PM

## 2023-06-30 NOTE — Progress Notes (Signed)
Echocardiogram 2D Echocardiogram has been performed.  Warren Lacy Genessis Flanary RDCS 06/30/2023, 10:40 AM

## 2023-06-30 NOTE — Progress Notes (Signed)
eLink Physician-Brief Progress Note Patient Name: Robert [name] DOB: 07-02-1961 MRN: [id number]   Date of Service  06/30/2023  HPI/Events of Note  62 year old male that presented with postcardiac arrest.  Never febrile but started on scheduled methylprednisolone and scheduled Tylenol.  Question about whether to maintain Tylenol for the time being.  eICU Interventions  Continue Tylenol as previously scheduled.  Lactic acid in the lab, pending  DC famotidine given concurrent pantoprazole as ordered     Intervention Category Minor Interventions: Routine modifications to care plan (e.g. PRN medications for pain, fever)  Marisue Canion 06/30/2023, 9:59 PM

## 2023-06-30 NOTE — ED Notes (Signed)
Echo at bedside

## 2023-06-30 NOTE — Progress Notes (Signed)
LTM EEG hooked up and running - no initial skin breakdown - push button tested - [facility] monitoring.  

## 2023-06-30 NOTE — Procedures (Signed)
Arterial Catheter Insertion Procedure Note  Robert [name]  [id number]  14-Aug-1960  Date:06/30/23  Time:2:59 PM    Provider Performing: Cristopher Peru    Procedure: Insertion of Arterial Line (78295) with US guidance (62130)   Indication(s) Blood pressure monitoring and/or need for frequent ABGs  Consent Risks of the procedure as well as the alternatives and risks of each were explained to the patient and/or caregiver.  Consent for the procedure was obtained and is signed in the bedside chart  Anesthesia None   Time Out Verified patient identification, verified procedure, site/side was marked, verified correct patient position, special equipment/implants available, medications/allergies/relevant history reviewed, required imaging and test results available.   Sterile Technique Maximal sterile technique including full sterile barrier drape, hand hygiene, sterile gown, sterile gloves, mask, hair covering, sterile ultrasound probe cover (if used).   Procedure Description Area of catheter insertion was cleaned with chlorhexidine and draped in sterile fashion. With real-time ultrasound guidance an arterial catheter was placed into the left radial artery.  Appropriate arterial tracings confirmed on monitor.     Complications/Tolerance None; patient tolerated the procedure well.   EBL Minimal   Specimen(s) None   Under direct supervision of Posey Boyer, NP

## 2023-06-30 NOTE — ED Notes (Signed)
Critical abg resutls given to Dr. Jean Rosenthal. Verbal order received to increase RR to 32 and wean peep/FiO2. RT will continue to monitor and be available as needed.

## 2023-06-30 NOTE — ED Notes (Signed)
OG placement confirmed by MD

## 2023-06-30 NOTE — ED Provider Notes (Signed)
[place] EMERGENCY DEPARTMENT AT [facility] Provider Note   CSN: [id number] Arrival date & time: 06/30/23  4098     History  Chief Complaint  Patient presents with   Cardiac Arrest    Robert [name] is a 62 y.o. male with a PMH of COPD on 2 L at baseline, diabetes, HTN who presented to the ED for cardiac arrest.  Patient's wife reports that he was walking up the stairs when he became short of breath, so he sat on the bed and turned his oxygen up from 2 L to 5 L.  He request that his wife give him a breathing treatment, which she did.  She reports that while this was happening, his eyes closed and he fell back in the bed.  She reports at this time she called 911, but the patient was still breathing.  Reports when EMS arrived, the patient lost pulses, so they started doing CPR.  EMS reports they found the patient to be in PEA and performed CPR for approximately 15 minutes administered 2 doses of epinephrine.  Reports ROSC was achieved and they started the patient on epinephrine drip and started pacing the patient when his heart rate dropped into the 40s.  The wife reports that prior to the episode walking up the stairs, the patient had been feeling well, but thought that maybe his ankles were swollen this morning.  Reports he started using a supplement called Mullein that can be used for coughing and COPD this week, but denies anything otherwise out of the ordinary.  States he has a history of COPD and wears 2 L at baseline but denies a history of a blood clot or heart attack.   Cardiac Arrest      Home Medications Prior to Admission medications   Medication Sig Start Date End Date Taking? Authorizing Provider  albuterol (VENTOLIN HFA) 108 (90 Base) MCG/ACT inhaler Inhale 2 puffs into the lungs every 6 (six) hours as needed for wheezing or shortness of breath. 10/29/21   Lorin Glass, MD  amLODipine (NORVASC) 10 MG tablet Take 5 mg by mouth daily. 03/27/21   [provider]  atorvastatin (LIPITOR) 80 MG tablet Take 80 mg by mouth daily.    [provider]  cetirizine (ZYRTEC) 10 MG tablet Take 10 mg by mouth daily.      [provider]  Cholecalciferol 25 MCG (1000 UT) tablet Take 2,000 Units by mouth daily. 03/27/21   [provider]  clotrimazole (LOTRIMIN) 1 % cream Apply 1 application. topically daily. 02/04/21   [provider]  COD LIVER OIL PO Take 1 capsule by mouth daily.    [provider]  fluticasone (FLONASE) 50 MCG/ACT nasal spray Place 2 sprays into both nostrils daily. 03/27/21   [provider]  fluticasone-salmeterol (ADVAIR) 250-50 MCG/ACT AEPB Inhale 1 puff into the lungs in the morning and at bedtime. 10/29/21 01/28/22  Lorin Glass, MD  hydrochlorothiazide (HYDRODIURIL) 25 MG tablet Take 25 mg by mouth daily. 04/03/15   [provider]  ipratropium-albuterol (DUONEB) 0.5-2.5 (3) MG/3ML SOLN Take 3 mLs by nebulization every 4 (four) hours as needed. 01/30/22   Osvaldo Shipper, MD  ketoconazole (NIZORAL) 2 % shampoo Apply 1 application. topically daily. 03/18/21   [provider]  losartan (COZAAR) 100 MG tablet Take 100 mg by mouth daily.      [provider]  metFORMIN (GLUCOPHAGE-XR) 500 MG 24 hr tablet Take 500 mg by mouth every other day.  03/27/21   [provider]  Omega-3 Fatty Acids (FISH OIL) 1000 MG CAPS Take 1,000 mg by mouth 2 (two) times daily.    [provider]  pantoprazole (PROTONIX) 40 MG tablet Take 40 mg by mouth 2 (two) times daily.    [provider]  pioglitazone (ACTOS) 30 MG tablet Take 30 mg by mouth daily. 12/22/20   [provider]  predniSONE (DELTASONE) 20 MG tablet Take 3 tablets once daily for 3 days followed by 2 tablets once daily for 3 days followed by 1 tablet once daily for 3 days and then stop 01/30/22   Osvaldo Shipper, MD  tiotropium (SPIRIVA) 18 MCG inhalation capsule Place 1 capsule (18 mcg  total) into inhaler and inhale daily. 11/16/10   [name], MD  vitamin E 180 MG (400 UNITS) capsule Take 400 Units by mouth daily.    [provider]      Allergies    Ace inhibitors, Aspirin, and Latex    Review of Systems   Review of Systems  Physical Exam Updated Vital Signs BP 106/63   Pulse 73   Temp (!) 96.8 F (36 C)   Resp (!) 29   Ht 5\' 6"  (1.676 m)   Wt 113 kg   SpO2 100%   BMI 40.21 kg/m  Physical Exam Constitutional:      General: He is not in acute distress.    Appearance: He is ill-appearing. He is not diaphoretic.  HENT:     Head: Normocephalic and atraumatic.     Nose: Nose normal.     Mouth/Throat:     Mouth: Mucous membranes are moist.     Pharynx: Oropharynx is clear.  Eyes:     Pupils: Pupils are equal, round, and reactive to light.  Cardiovascular:     Rate and Rhythm: Regular rhythm. Tachycardia present.     Pulses: Normal pulses.     Heart sounds: Normal heart sounds. No murmur heard.    No friction rub. No gallop.  Pulmonary:     Breath sounds: No stridor. Rhonchi present. No wheezing or rales.     Comments: BVM via iGel Abdominal:     Palpations: Abdomen is soft.     Tenderness: There is no abdominal tenderness. There is no guarding or rebound.  Musculoskeletal:        General: Normal range of motion.     Cervical back: Normal range of motion and neck supple.     Right lower leg: Edema present.     Left lower leg: Edema present.     Comments: Trace pitting edema bilaterally  Skin:    General: Skin is warm and dry.  Neurological:     General: No focal deficit present.     ED Results / Procedures / Treatments   Labs (all labs ordered are listed, but only abnormal results are displayed) Labs Reviewed  COMPREHENSIVE METABOLIC PANEL - Abnormal; Notable for the following components:      Result Value   Chloride 92 (*)    CO2 33 (*)    Glucose, Bld 191 (*)    Creatinine, Ser 1.32 (*)    Calcium 8.6 (*)    Total  Bilirubin 1.2 (*)    All other components within normal limits  CBC - Abnormal; Notable for the following components:   WBC 10.6 (*)    MCHC 29.0 (*)    All other components within normal limits  PROTIME-INR - Abnormal; Notable for the  following components:   Prothrombin Time 16.5 (*)    INR 1.3 (*)    All other components within normal limits  PHOSPHORUS - Abnormal; Notable for the following components:   Phosphorus 8.2 (*)    All other components within normal limits  I-STAT CG4 LACTIC ACID, ED - Abnormal; Notable for the following components:   Lactic Acid, Venous 6.2 (*)    All other components within normal limits  CBG MONITORING, ED - Abnormal; Notable for the following components:   Glucose-Capillary 141 (*)    All other components within normal limits  I-STAT ARTERIAL BLOOD GAS, ED - Abnormal; Notable for the following components:   pH, Arterial 7.188 (*)    pCO2 arterial 96.3 (*)    pO2, Arterial 401 (*)    Bicarbonate 36.6 (*)    TCO2 39 (*)    Acid-Base Excess 5.0 (*)    Potassium 5.4 (*)    Calcium, Ion 1.13 (*)    All other components within normal limits  TROPONIN I (HIGH SENSITIVITY) - Abnormal; Notable for the following components:   Troponin I (High Sensitivity) 37 (*)    All other components within normal limits  CULTURE, BLOOD (ROUTINE X 2)  CULTURE, BLOOD (ROUTINE X 2)  CULTURE, RESPIRATORY W GRAM STAIN  CULTURE, BLOOD (ROUTINE X 2)  CULTURE, BLOOD (ROUTINE X 2)  CULTURE, RESPIRATORY W GRAM STAIN  APTT  MAGNESIUM  TRIGLYCERIDES  BLOOD GAS, ARTERIAL  RAPID URINE DRUG SCREEN, HOSP PERFORMED  PROCALCITONIN  URINALYSIS, W/ REFLEX TO CULTURE (INFECTION SUSPECTED)  HIV ANTIBODY (ROUTINE TESTING W REFLEX)  PROCALCITONIN  BRAIN NATRIURETIC PEPTIDE  PROTIME-INR  APTT  HEMOGLOBIN A1C  BLOOD GAS, ARTERIAL  I-STAT CG4 LACTIC ACID, ED  TYPE AND SCREEN  ABO/RH    EKG EKG Interpretation Date/Time:  Thursday June 30 2023 10:03:18 EST Ventricular  Rate:  81 PR Interval:  223 QRS Duration:  103 QT Interval:  369 QTC Calculation: 429 R Axis:   -23  Text Interpretation: Sinus rhythm Prolonged PR interval Borderline left axis deviation Confirmed by Gerhard Munch [phone]) on 06/30/2023 10:29:45 AM  Radiology DG Abd Portable 1V  Result Date: 06/30/2023 CLINICAL DATA:  Tube placement EXAM: PORTABLE ABDOMEN - 1 VIEW limited for tube placement COMPARISON:  None Available. FINDINGS: Enteric tube in place with tip overlying the distal stomach. Elsewhere on this limited supine view of the abdomen there is some distended air-filled loops of small bowel in the midabdomen. Colonic stool. IMPRESSION: Enteric tube seen along the distal stomach on this limited upright portable x-ray Electronically Signed   By: Karen Kays M.D.   On: 06/30/2023 11:09   DG Chest Port 1 View  Result Date: 06/30/2023 CLINICAL DATA:  Post intubation EXAM: PORTABLE CHEST 1 VIEW COMPARISON:  CXR 01/27/22 FINDINGS: Endotracheal tube terminates 7 cm above the carina. Enteric tube courses below diaphragm with the side hole and tip projecting over the expected location of the stomach. No pleural effusion. No pneumothorax. Cardiomegaly. Patchy bilateral airspace opacities are nonspecific represent pulmonary edema or multifocal infection. No radiographically apparent displaced rib fractures. Visualized upper abdomen is unremarkable. IMPRESSION: 1. Endotracheal tube terminates 7 cm above the carina. 2. Patchy bilateral airspace opacities are nonspecific represent pulmonary edema or multifocal infection. Electronically Signed   By: Lorenza Cambridge M.D.   On: 06/30/2023 11:05   ECHOCARDIOGRAM COMPLETE  Result Date: 06/30/2023    ECHOCARDIOGRAM REPORT   Patient Name:   KOLSON SARAFIN Date of Exam: 06/30/2023 Medical Rec #:  [id number]       Height:       66.0 in Accession #:    [id number]      Weight:       243.8 lb Date of Birth:  [birth year]       BSA:          2.176 m Patient Age:    62 years         BP:           155/91 mmHg Patient Gender: M               HR:           111 bpm. Exam Location:  Inpatient Procedure: 2D Echo, Color Doppler and Cardiac Doppler STAT ECHO Indications:    I42.9 Cardiomyopathy (unspecified)  History:        Patient has no prior history of Echocardiogram examinations.                 COPD; Risk Factors:Hypertension and Dyslipidemia.  Sonographer:    Irving Burton Senior RDCS Referring Phys: [phone] ROBERT LOCKWOOD  Sonographer Comments: STAT post arrest IMPRESSIONS  1. Left ventricular ejection fraction, by estimation, is 35 to 40%. The left ventricle has moderately decreased function. The left ventricle demonstrates global hypokinesis. The left ventricular internal cavity size was mildly dilated. There is mild concentric left ventricular hypertrophy. Left ventricular diastolic parameters are indeterminate.  2. Right ventricular systolic function is mildly reduced. The right ventricular size is mildly enlarged. Tricuspid regurgitation signal is inadequate for assessing PA pressure.  3. Left atrial size was mildly dilated.  4. Right atrial size was mildly dilated.  5. The mitral valve is normal in structure. Trivial mitral valve regurgitation.  6. The aortic valve is tricuspid. There is mild calcification of the aortic valve. Aortic valve regurgitation is not visualized. Aortic valve sclerosis is present, with no evidence of aortic valve stenosis.  7. Aortic Ascending aorta appears normal in size and structure. There is borderline dilatation of the aortic root, measuring 39 mm. FINDINGS  Left Ventricle: Left ventricular ejection fraction, by estimation, is 35 to 40%. The left ventricle has moderately decreased function. The left ventricle demonstrates global hypokinesis. The left ventricular internal cavity size was mildly dilated. There is mild concentric left ventricular hypertrophy. Left ventricular diastolic parameters are indeterminate. Right Ventricle: The right ventricular size is  mildly enlarged. No increase in right ventricular wall thickness. Right ventricular systolic function is mildly reduced. Tricuspid regurgitation signal is inadequate for assessing PA pressure. Left [facility]: Left atrial size was mildly dilated. Right [facility]: Right atrial size was mildly dilated. Pericardium: There is no evidence of pericardial effusion. Mitral Valve: The mitral valve is normal in structure. Trivial mitral valve regurgitation. Tricuspid Valve: The tricuspid valve is normal in structure. Tricuspid valve regurgitation is trivial. Aortic Valve: The aortic valve is tricuspid. There is mild calcification of the aortic valve. Aortic valve regurgitation is not visualized. Aortic valve sclerosis is present, with no evidence of aortic valve stenosis. Pulmonic Valve: The pulmonic valve was normal in structure. Pulmonic valve regurgitation is mild. Aorta: Ascending aorta appears normal in size and structure. There is borderline dilatation of the aortic root, measuring 39 mm. Venous: IVC assessment for right atrial pressure unable to be performed due to mechanical ventilation. IAS/Shunts: No atrial level shunt detected by color flow Doppler.  LEFT VENTRICLE PLAX 2D LVIDd:         5.00 cm LVIDs:  4.30 cm LV PW:         1.20 cm LV IVS:        0.95 cm LVOT diam:     2.20 cm LV SV:         65 LV SV Index:   30 LVOT Area:     3.80 cm  LV Volumes (MOD) LV vol d, MOD A2C: 175.0 ml LV vol d, MOD A4C: 130.0 ml LV vol s, MOD A2C: 105.0 ml LV vol s, MOD A4C: 83.0 ml LV SV MOD A2C:     70.0 ml LV SV MOD A4C:     130.0 ml LV SV MOD BP:      61.7 ml RIGHT VENTRICLE RV S prime:     10.30 cm/s TAPSE (M-mode): 2.2 cm LEFT ATRIUM             Index        RIGHT ATRIUM           Index LA diam:        3.85 cm 1.77 cm/m   RA Area:     23.90 cm LA Vol (A2C):   66.9 ml 30.75 ml/m  RA Volume:   75.20 ml  34.57 ml/m LA Vol (A4C):   79.9 ml 36.73 ml/m LA Biplane Vol: 76.3 ml 35.07 ml/m  AORTIC VALVE LVOT Vmax:   96.60 cm/s  LVOT Vmean:  69.100 cm/s LVOT VTI:    0.172 m  AORTA Ao Root diam: 3.85 cm  SHUNTS Systemic VTI:  0.17 m Systemic Diam: 2.20 cm Clearnce Hasten Electronically signed by Clearnce Hasten Signature Date/Time: 06/30/2023/10:46:37 AM    Final     Procedures Procedure Name: Intubation Date/Time: 06/30/2023 10:22 AM  Performed by: Janyth Pupa, MDPre-anesthesia Checklist: Patient identified, Emergency Drugs available, Suction available, Patient being monitored and Timeout performed Preoxygenation: Pre-oxygenation with 100% oxygen (iGel) Induction Type: Rapid sequence Laryngoscope Size: Mac and 4 Grade View: Grade I Tube size: 7.5 mm Number of attempts: 1 Airway Equipment and Method: Rigid stylet and Video-laryngoscopy Placement Confirmation: ETT inserted through vocal cords under direct vision, CO2 detector, Breath sounds checked- equal and bilateral and Positive ETCO2 Secured at: 22 cm Tube secured with: ETT holder        Medications Ordered in ED Medications  etomidate (AMIDATE) injection 30 mg (30 mg Intravenous Not Given 06/30/23 1009)  rocuronium (ZEMURON) injection 100 mg (100 mg Intravenous Not Given 06/30/23 1010)  etomidate (AMIDATE) injection (20 mg Intravenous Given 06/30/23 1009)  rocuronium (ZEMURON) injection (100 mg Intravenous Given 06/30/23 1009)  propofol (DIPRIVAN) 1000 MG/100ML infusion ( Intravenous Not Given 06/30/23 1113)  polyethylene glycol (MIRALAX / GLYCOLAX) packet 17 g (has no administration in time range)  famotidine (PEPCID) tablet 20 mg (has no administration in time range)  pantoprazole (PROTONIX) injection 40 mg (has no administration in time range)  0.9 %  sodium chloride infusion ( Intravenous New Bag/Given 06/30/23 1122)  0.9 %  sodium chloride infusion (250 mLs Intravenous Not Given 06/30/23 1109)  norepinephrine (LEVOPHED) 4mg  in (0.016 mg/mL) premix infusion ( Intravenous Not Given 06/30/23 1110)  docusate (COLACE) 50 MG/5ML liquid 100 mg (has no  administration in time range)  lactated ringers bolus 1,000 mL (1,000 mLs Intravenous New Bag/Given 06/30/23 1029)  sodium bicarbonate injection 100 mEq (100 mEq Intravenous Given 06/30/23 1113)    ED Course/ Medical Decision Making/ A&P  Medical Decision Making Amount and/or Complexity of Data Reviewed Labs: ordered. Radiology: ordered.  Risk Prescription drug management.   Vital signs initially with hypertension with BP 180s/105 and rate in the 70s.  Physical exam with an unresponsive patient who is being ventilated via Igel.  He has trace pitting edema bilaterally, but no other diagnostic findings on physical exam.  Epinephrine drip changed to Levophed, which was rapidly down titrated after manual BP was measured with systolic in the 170s.  Patient was intubated as described above without difficulty and propofol was started for sedation.  OG tube and Foley catheter placed..  Blood gas with pH 7.18, pCO2 96, pO2 401.  CMP with creatinine 1.3, no other gross metabolic or electrolyte abnormalities.  WBC 10.6.  Initial troponin 37.  EKG appeared to have nonspecific ST changes that are likely secondary to postcardiac arrest.  CT head and CTA PE were ordered.  Patient was discussed with the cardiology service by Dr. Jeraldine Loots.  Additionally, he spoke with the critical care service, who has agreed to accept the patient for admission.  Family was updated, and they expressed appreciation for the care the patient has received from EMS and in the emergency department.  Patient was transported to the ICU without further emergent intervention required in the ED.        Final Clinical Impression(s) / ED Diagnoses Final diagnoses:  None    Rx / DC Orders ED Discharge Orders     None         Janyth Pupa, MD 06/30/23 1605    Gerhard Munch, MD 07/01/23 [phone]

## 2023-07-01 DIAGNOSIS — J989 Respiratory disorder, unspecified: Secondary | ICD-10-CM

## 2023-07-01 DIAGNOSIS — I468 Cardiac arrest due to other underlying condition: Secondary | ICD-10-CM

## 2023-07-01 DIAGNOSIS — R569 Unspecified convulsions: Secondary | ICD-10-CM

## 2023-07-01 DIAGNOSIS — G40409 Other generalized epilepsy and epileptic syndromes, not intractable, without status epilepticus: Secondary | ICD-10-CM

## 2023-07-01 LAB — RESPIRATORY PANEL BY PCR

## 2023-07-01 LAB — CBC WITH DIFFERENTIAL/PLATELET
Abs Immature Granulocytes: 0.04 10*3/uL (ref 0.00–0.07)
Basophils Absolute: 0 10*3/uL (ref 0.0–0.1)
Basophils Relative: 0 %
Eosinophils Absolute: 0 10*3/uL (ref 0.0–0.5)
Eosinophils Relative: 0 %
HCT: 37.3 % — ABNORMAL LOW (ref 39.0–52.0)
Hemoglobin: 12 g/dL — ABNORMAL LOW (ref 13.0–17.0)
Immature Granulocytes: 1 %
Lymphocytes Relative: 9 %
Lymphs Abs: 0.7 10*3/uL (ref 0.7–4.0)
MCH: 28.6 pg (ref 26.0–34.0)
MCHC: 32.2 g/dL (ref 30.0–36.0)
MCV: 89 fL (ref 80.0–100.0)
Monocytes Absolute: 0.4 10*3/uL (ref 0.1–1.0)
Monocytes Relative: 5 %
Neutro Abs: 7 10*3/uL (ref 1.7–7.7)
Neutrophils Relative %: 85 %
Platelets: 243 10*3/uL (ref 150–400)
RBC: 4.19 MIL/uL — ABNORMAL LOW (ref 4.22–5.81)
RDW: 14.5 % (ref 11.5–15.5)
WBC: 8.2 10*3/uL (ref 4.0–10.5)
nRBC: 0 % (ref 0.0–0.2)

## 2023-07-01 LAB — BASIC METABOLIC PANEL
Anion gap: 13 (ref 5–15)
Anion gap: 9 (ref 5–15)
BUN: 17 mg/dL (ref 8–23)
BUN: 17 mg/dL (ref 8–23)
CO2: 27 mmol/L (ref 22–32)
CO2: 30 mmol/L (ref 22–32)
Calcium: 8.3 mg/dL — ABNORMAL LOW (ref 8.9–10.3)
Calcium: 8.1 mg/dL — ABNORMAL LOW (ref 8.9–10.3)
Chloride: 100 mmol/L (ref 98–111)
Chloride: 103 mmol/L (ref 98–111)
Creatinine, Ser: 0.84 mg/dL (ref 0.61–1.24)
Creatinine, Ser: 0.81 mg/dL (ref 0.61–1.24)
GFR, Estimated: >60 mL/min (ref >60)
GFR, Estimated: >60 mL/min (ref >60)
Glucose, Bld: 128 mg/dL — ABNORMAL HIGH (ref 70–99)
Glucose, Bld: 148 mg/dL — ABNORMAL HIGH (ref 70–99)
Potassium: 3.4 mmol/L — ABNORMAL LOW (ref 3.5–5.1)
Potassium: 3.8 mmol/L (ref 3.5–5.1)
Sodium: 140 mmol/L (ref 135–145)
Sodium: 142 mmol/L (ref 135–145)

## 2023-07-01 LAB — POCT I-STAT 7, (LYTES, BLD GAS, ICA,H+H)
Acid-Base Excess: 10 mmol/L — ABNORMAL HIGH (ref 0.0–2.0)
Acid-Base Excess: 8 mmol/L — ABNORMAL HIGH (ref 0.0–2.0)
Bicarbonate: 32.1 mmol/L — ABNORMAL HIGH (ref 20.0–28.0)
Bicarbonate: 33.2 mmol/L — ABNORMAL HIGH (ref 20.0–28.0)
Calcium, Ion: 1.08 mmol/L — ABNORMAL LOW (ref 1.15–1.40)
Calcium, Ion: 1.11 mmol/L — ABNORMAL LOW (ref 1.15–1.40)
HCT: 37 % — ABNORMAL LOW (ref 39.0–52.0)
HCT: 37 % — ABNORMAL LOW (ref 39.0–52.0)
Hemoglobin: 12.6 g/dL — ABNORMAL LOW (ref 13.0–17.0)
Hemoglobin: 12.6 g/dL — ABNORMAL LOW (ref 13.0–17.0)
O2 Saturation: 98 %
O2 Saturation: 96 %
Patient temperature: 36.4
Patient temperature: 36.4
Potassium: 3.2 mmol/L — ABNORMAL LOW (ref 3.5–5.1)
Potassium: 3.8 mmol/L (ref 3.5–5.1)
Sodium: 141 mmol/L (ref 135–145)
Sodium: 141 mmol/L (ref 135–145)
TCO2: 33 mmol/L — ABNORMAL HIGH (ref 22–32)
TCO2: 35 mmol/L — ABNORMAL HIGH (ref 22–32)
pCO2 arterial: 33.6 mm[Hg] (ref 32–48)
pCO2 arterial: 45 mm[Hg] (ref 32–48)
pH, Arterial: 7.586 — ABNORMAL HIGH (ref 7.35–7.45)
pH, Arterial: 7.474 — ABNORMAL HIGH (ref 7.35–7.45)
pO2, Arterial: 87 mm[Hg] (ref 83–108)
pO2, Arterial: 77 mm[Hg] — ABNORMAL LOW (ref 83–108)

## 2023-07-01 LAB — PHOSPHORUS
Phosphorus: 1.6 mg/dL — ABNORMAL LOW (ref 2.5–4.6)
Phosphorus: 2.7 mg/dL (ref 2.5–4.6)

## 2023-07-01 LAB — RESP PANEL BY RT-PCR (RSV, FLU A&B, COVID)  RVPGX2
Influenza A by PCR: NEGATIVE
Influenza B by PCR: NEGATIVE
Resp Syncytial Virus by PCR: NEGATIVE
SARS Coronavirus 2 by RT PCR: NEGATIVE

## 2023-07-01 LAB — LACTIC ACID, PLASMA: Lactic Acid, Venous: <0.3 mmol/L — ABNORMAL LOW (ref 0.5–1.9)

## 2023-07-01 LAB — LIPID PANEL
Cholesterol: 151 mg/dL (ref 0–200)
HDL: 48 mg/dL (ref >40)
LDL Cholesterol: 73 mg/dL (ref 0–99)
Total CHOL/HDL Ratio: 3.1 {ratio}
Triglycerides: 150 mg/dL — ABNORMAL HIGH (ref <150)
VLDL: 30 mg/dL (ref 0–40)

## 2023-07-01 LAB — HEPATIC FUNCTION PANEL
ALT: 19 U/L (ref 0–44)
AST: 31 U/L (ref 15–41)
Albumin: 2.9 g/dL — ABNORMAL LOW (ref 3.5–5.0)
Alkaline Phosphatase: 56 U/L (ref 38–126)
Bilirubin, Direct: 0.2 mg/dL (ref 0.0–0.2)
Indirect Bilirubin: 0.8 mg/dL (ref 0.3–0.9)
Total Bilirubin: 1 mg/dL (ref <1.2)
Total Protein: 5.9 g/dL — ABNORMAL LOW (ref 6.5–8.1)

## 2023-07-01 LAB — GLUCOSE, CAPILLARY
Glucose-Capillary: 102 mg/dL — ABNORMAL HIGH (ref 70–99)
Glucose-Capillary: 123 mg/dL — ABNORMAL HIGH (ref 70–99)
Glucose-Capillary: 142 mg/dL — ABNORMAL HIGH (ref 70–99)
Glucose-Capillary: 158 mg/dL — ABNORMAL HIGH (ref 70–99)
Glucose-Capillary: 172 mg/dL — ABNORMAL HIGH (ref 70–99)

## 2023-07-01 LAB — PROCALCITONIN: Procalcitonin: 3.7 ng/mL

## 2023-07-01 LAB — VALPROIC ACID LEVEL: Valproic Acid Lvl: 57 ug/mL (ref 50.0–100.0)

## 2023-07-01 LAB — MAGNESIUM
Magnesium: 1.6 mg/dL — ABNORMAL LOW (ref 1.7–2.4)
Magnesium: 2.8 mg/dL — ABNORMAL HIGH (ref 1.7–2.4)

## 2023-07-01 LAB — HEMOGLOBIN A1C
Hgb A1c MFr Bld: 6.2 % — ABNORMAL HIGH (ref 4.8–5.6)
Mean Plasma Glucose: 131.24 mg/dL

## 2023-07-01 LAB — TRIGLYCERIDES: Triglycerides: 146 mg/dL (ref <150)

## 2023-07-01 MED ORDER — VITAL 1.5 CAL PO LIQD
1000.0000 mL | ORAL | Status: DC
  Administered 2023-07-01: 1000 mL

## 2023-07-01 MED ORDER — PIVOT 1.5 CAL PO LIQD
1000.0000 mL | ORAL | Status: DC
Start: 2023-07-01 — End: 2023-07-01
  Filled 2023-07-01: qty 1000

## 2023-07-01 MED ORDER — PROSOURCE TF20 ENFIT COMPATIBL EN LIQD
60.0000 mL | Freq: Every day | ENTERAL | Status: DC
  Administered 2023-07-01 – 2023-07-03 (×3): 60 mL
  Filled 2023-07-01 (×3): qty 60

## 2023-07-01 MED ORDER — POTASSIUM CHLORIDE 20 MEQ PO PACK
60.0000 meq | PACK | Freq: Once | ORAL | Status: AC
  Administered 2023-07-01: 60 meq
  Filled 2023-07-01: qty 3

## 2023-07-01 MED ORDER — SODIUM CHLORIDE 0.9 % IV SOLN
2.0000 g | INTRAVENOUS | Status: DC
  Administered 2023-07-01 – 2023-07-03 (×3): 2 g via INTRAVENOUS
  Filled 2023-07-01 (×3): qty 20

## 2023-07-01 MED ORDER — INSULIN ASPART 100 UNIT/ML IJ SOLN
0.0000 [IU] | INTRAMUSCULAR | Status: DC
  Administered 2023-07-01 (×3): 1 [IU] via SUBCUTANEOUS
  Administered 2023-07-02: 3 [IU] via SUBCUTANEOUS
  Administered 2023-07-02 (×2): 1 [IU] via SUBCUTANEOUS
  Administered 2023-07-02: 2 [IU] via SUBCUTANEOUS
  Administered 2023-07-02: 3 [IU] via SUBCUTANEOUS
  Administered 2023-07-02: 1 [IU] via SUBCUTANEOUS
  Administered 2023-07-02: 3 [IU] via SUBCUTANEOUS
  Administered 2023-07-03 (×2): 2 [IU] via SUBCUTANEOUS
  Administered 2023-07-03: 3 [IU] via SUBCUTANEOUS
  Administered 2023-07-03: 2 [IU] via SUBCUTANEOUS
  Administered 2023-07-03: 1 [IU] via SUBCUTANEOUS
  Administered 2023-07-03: 5 [IU] via SUBCUTANEOUS
  Administered 2023-07-04 (×2): 2 [IU] via SUBCUTANEOUS

## 2023-07-01 MED ORDER — VITAL 1.5 CAL PO LIQD
1000.0000 mL | ORAL | Status: DC
  Administered 2023-07-02: 1000 mL

## 2023-07-01 MED ORDER — THIAMINE MONONITRATE 100 MG PO TABS
100.0000 mg | ORAL_TABLET | Freq: Every day | ORAL | Status: DC
  Administered 2023-07-01 – 2023-07-03 (×3): 100 mg
  Filled 2023-07-01 (×3): qty 1

## 2023-07-01 MED ORDER — LABETALOL HCL 5 MG/ML IV SOLN
10.0000 mg | INTRAVENOUS | Status: DC | PRN
  Administered 2023-07-02 – 2023-07-04 (×5): 10 mg via INTRAVENOUS
  Filled 2023-07-01 (×5): qty 4

## 2023-07-01 MED ORDER — MAGNESIUM SULFATE 4 GM/100ML IV SOLN
4.0000 g | Freq: Once | INTRAVENOUS | Status: AC
  Administered 2023-07-01: 4 g via INTRAVENOUS
  Filled 2023-07-01 (×2): qty 100

## 2023-07-01 MED ORDER — SODIUM CHLORIDE 0.9 % IV SOLN: 2.0000 g | INTRAVENOUS | Status: DC

## 2023-07-01 MED ORDER — FONDAPARINUX SODIUM 2.5 MG/0.5ML ~~LOC~~ SOLN
2.5000 mg | SUBCUTANEOUS | Status: DC
  Administered 2023-07-01 – 2023-07-03 (×3): 2.5 mg via SUBCUTANEOUS
  Filled 2023-07-01 (×4): qty 0.5

## 2023-07-01 MED ORDER — POTASSIUM CHLORIDE CRYS ER 20 MEQ PO TBCR: 60.0000 meq | EXTENDED_RELEASE_TABLET | Freq: Once | ORAL | Status: DC

## 2023-07-01 NOTE — Progress Notes (Signed)
   07/01/23 1300  Spiritual Encounters  Type of Visit Initial  Care provided to: [facility] partners present during encounter Other (comment)  Referral source Nurse (RN/NT/LPN)  Reason for visit Advance directives  OnCall Visit No   Chaplain responded to request for AD assistance. Patient's family was present at bedside. Chaplain assisted family at scanning AD document in patient's chart. No follow-up needed at this time.

## 2023-07-01 NOTE — Progress Notes (Addendum)
NAME:  Robert [name], MRN:  [id number], DOB:  1960-11-22, LOS: 1 ADMISSION DATE:  06/30/2023, CONSULTATION DATE:  06/30/23 REFERRING MD:  12/05 CHIEF COMPLAINT:  Cardiac Arrest   History of Present Illness:  Robert [name] is a 62 year old male former smoker with known COPD that is oxygen dependent and is followed by Dr. Craige Cotta in the [facility].  He was in his usual state of poor health with shortness of breath and after walking up a set of steps he asked his wife for breathing treatment and while administering the breathing treatment she noticed that he became unconscious.  He maintained a pulse until EMS arrived at which time no pulse was detected.  Therefore PEA protocol was implemented with chest compressions bagging and after epinephrine was administered he was transferred to [facility] and intubated.  Initially was on epinephrine drip Levophed drip but now is on no vasopressor support at all.  Extremely acidotic with a pH of 7.1 for which she was given bicarbonate x 2 and respiratory rate was increased to 34.  He is off sedation and is not following commands.  He will need a head CT and possibly further neurological evaluations.  He has orders written to go to the coronary intensive care unit.  Pertinent  Medical History  Chronic hypoxic respiratory failure 2/2 to COPD HTN HLD T2DM Significant Hospital Events: Including procedures, antibiotic start and stop dates in addition to other pertinent events   12/05-admitted to ICU, intubated Interim History / Subjective:  Sedated and intubated. Not following commands, not withdrawing to pain  Objective   Blood pressure 120/76, pulse 65, temperature (!) 97.5 F (36.4 C), resp. rate 20, height 6\' 1"  (1.854 m), weight 104.8 kg, SpO2 100%.    Vent Mode: PRVC FiO2 (%):  [40 %-100 %] 40 % Set Rate:  [20 bmp-32 bmp] 20 bmp Vt Set:  [510 mL-630 mL] 630 mL PEEP:  [5 cmH20-8 cmH20] 5 cmH20 Plateau Pressure:  [19 cmH20-24 cmH20] 21 cmH20    Intake/Output Summary (Last 24 hours) at 07/01/2023 0756 Last data filed at 07/01/2023 0700 Gross per 24 hour  Intake 3742.32 ml  Output 1900 ml  Net 1842.32 ml   Filed Weights   06/30/23 1033 07/01/23 0441  Weight: 113 kg 104.8 kg    Examination: General: sedated, not following commands, looks comfortable HENT: intubated, pinpoint pupils Lungs: CTAB, vented breath sounds Cardiovascular: NSR, radial pulse palpated, 2+ LE edema Abdomen: not distended, bowel sounds present Extremities: no asymmetry, good muscle bulk and tone Neuro: unable to assess, not following commands, not withdrawing to pain GU: foley present  Labs   CBC: Recent Labs  Lab 06/30/23 1003 06/30/23 1102 06/30/23 1510 06/30/23 1523 06/30/23 1644 06/30/23 1755 07/01/23 0440  WBC 10.6*  --  10.4  --   --   --  8.2  NEUTROABS  --   --   --   --   --   --  7.0  HGB 13.6   < > 12.1* 12.9* 12.9* 12.6* 12.0*  HCT 46.9   < > 38.2* 38.0* 38.0* 37.0* 37.3*  MCV 99.2  --  91.2  --   --   --  89.0  PLT 325  --  234  --   --   --  243   < > = values in this interval not displayed.   Basic Metabolic Panel: Recent Labs  Lab 06/30/23 1003 06/30/23 1102 06/30/23 1510 06/30/23 1523 06/30/23 1644 06/30/23 1755 07/01/23  0440  NA 139 139  --  142 143 142 140  K 4.6 5.4*  --  3.5 3.2* 3.2* 3.4*  CL 92*  --   --   --   --   --  100  CO2 33*  --   --   --   --   --  27  GLUCOSE 191*  --   --   --   --   --  128*  BUN 15  --   --   --   --   --  17  CREATININE 1.32*  --  1.11  --   --   --  0.84  CALCIUM 8.6*  --   --   --   --   --  8.3*  MG 2.1  --   --   --   --   --  1.6*  PHOS 8.2*  --   --   --   --   --   --    GFR: Estimated Creatinine Clearance: 115.9 mL/min (by C-G formula based on SCr of 0.84 mg/dL). Recent Labs  Lab 06/30/23 1003 06/30/23 1014 06/30/23 1510 06/30/23 2038 07/01/23 0440  PROCALCITON <0.10  --  1.94  --   --   WBC 10.6*  --  10.4  --  8.2  LATICACIDVEN  --  6.2*  --  2.0*  <0.3*   Liver Function Tests: Recent Labs  Lab 06/30/23 1003 07/01/23 0440  AST 31 31  ALT 17 19  ALKPHOS 75 56  BILITOT 1.2* 1.0  PROT 7.0 5.9*  ALBUMIN 3.8 2.9*   No results for input(s): "LIPASE", "AMYLASE" in the last 168 hours. No results for input(s): "AMMONIA" in the last 168 hours. ABG    Component Value Date/Time   PHART 7.547 (H) 06/30/2023 1755   PCO2ART 41.4 06/30/2023 1755   PO2ART 85 06/30/2023 1755   HCO3 36.1 (H) 06/30/2023 1755   TCO2 37 (H) 06/30/2023 1755   O2SAT 98 06/30/2023 1755    Coagulation Profile: Recent Labs  Lab 06/30/23 1003 06/30/23 1530  INR 1.3* 1.3*   Cardiac Enzymes: No results for input(s): "CKTOTAL", "CKMB", "CKMBINDEX", "TROPONINI" in the last 168 hours. HbA1C: Hgb A1c MFr Bld  Date/Time Value Ref Range Status  06/30/2023 03:10 PM 6.2 (H) 4.8 - 5.6 % Final    Comment:    (NOTE) Pre diabetes:          5.7%-6.4%  Diabetes:              >6.4%  Glycemic control for   <7.0% adults with diabetes   10/27/2021 11:20 PM 5.8 (H) 4.8 - 5.6 % Final    Comment:    (NOTE)         Prediabetes: 5.7 - 6.4         Diabetes: >6.4         Glycemic control for adults with diabetes: <7.0    CBG: Recent Labs  Lab 06/30/23 1041 06/30/23 1232 06/30/23 1536 06/30/23 1924 06/30/23 2311  GLUCAP 141* 143* 180* 137* 134*    Consults  Neurology Cardiology Assessment & Plan:  Principal Problem:   Cardiac arrest due to respiratory disorder (HCC) Active Problems:   Acute hypoxic on chronic hypercapnic respiratory failure (HCC)   COPD (chronic obstructive pulmonary disease) (HCC)   Myoclonus  PEA arrest secondary to COPD exacerbation with unknown downtime Respiratory Acidosis HF with reduced EF Pt presented after PEA arrest with prolonged  down time. Pt's ABG shows at 7.2/96/401. LA was 6.2. This improved with ventilation and ABG showing 7.5/41/8. Currently on nebulizers and IV steroids. CTH without anoxic changes but some concern for  cerebellar edema. EEG showed seizures that were ablated with IV medications. Currently on valproate. Cardiology and neurology following. Plan to repeat MRI after good seizure control for 72 hours. Will continue current supportive measures. Check ABG daily and adjust vent settings. Bcx with 1/4 bottles with GPR likely contaminant. Will repeat Bcx. No leukocytosis, no fevers. Will continue to monitor. Trachael aspirate with few GPC and rare GN diplococci. Given CXR findings will get viral panel, and procal. Off levophed and maintaining MAP >65. Will start tube feeds today.   Hypokalemia/Hypomagnesemia: Given 4 g mag and 60 mEq of K. Repeat in PM.   Chronic Problems: HTN: Holding home BP meds for now HLD: restart home lipitor 80 mg. Lipid panel pending.  Prediabetes vs diabetes: A1c pending. sSSI given steroids.   Best Practice (right click and "Reselect all SmartList Selections" daily)  Diet/type: NPO DVT prophylaxis: prophylactic heparin  GI prophylaxis: PPI Lines: Arterial Line Foley:  Yes, and it is still needed Continuous: Levophed, propfol  Code Status:  full code Last date of multidisciplinary goals of care discussion [12/06]

## 2023-07-01 NOTE — Progress Notes (Signed)
PHARMACY - PHYSICIAN COMMUNICATION CRITICAL VALUE ALERT - BLOOD CULTURE IDENTIFICATION (BCID)  Robert [name] is an 62 y.o. male who presented to [facility] on 06/30/2023 postcardiac arrest. 1/4 Bcx: gram positive rods in the aerobic bottle  -WBC 10.2 > 8.5, afebrile, no antibiotics this admission  Name of physician (or Provider) Contacted: Dr. Onnie Boer  Current antibiotics: None  Changes to prescribed antibiotics recommended:   -Monitor patient off antibiotics for time being given most likely contaminate   [name], PharmD. Clinical Pharmacist 07/01/2023 5:21 AM

## 2023-07-01 NOTE — Consult Note (Signed)
NEUROLOGY CONSULT NOTE   Date of service: July 01, 2023 Patient Name: Robert [name] MRN:  [id number] DOB:  [birth year] Chief Complaint: "Myoclonic seizures" Requesting Provider: Lynnell Catalan, MD  History of Present Illness  Robert [name] is a 62 y.o. male with hx of COPD, HTN, HLD, who per wife went upstairs, short of breath so went to lie down. During breathing treatment, he became unconscious. EMS called and was in PEA arrest. CPR x 15 mins with ROSC. He was intubated in the ED and noted to have random twitching of face and body concerning for myoclonic seizures.  He was put up on LTM with frequent myoclonic seizures. Neurology consulted for further evaliation and workup.  ROS  Unable to ascertain due to intuabtion and sedation.  Past History   Past Medical History:  Diagnosis Date   ACE-inhibitor cough    Acute respiratory failure [facility]) January 2015   2nd to AECOPD, required intubation   COPD with emphysema (HCC)        Hiatal hernia    Hyperlipidemia    Hypertension    Nephrolithiasis    Nocturnal hypoxemia due to emphysema [facility])    Pulmonary nodule, right 12/10/2010   Seasonal allergies     Past Surgical History:  Procedure Laterality Date   KNEE ARTHROSCOPY Right 2005   x 2 -had one in the service.    Family History: Family History  Problem Relation Age of Onset   Pancreatic cancer Father    Heart disease Sister     Social History  reports that he quit smoking about 31 years ago. His smoking use included cigarettes. He started smoking about 47 years ago. He has a 24 pack-year smoking history. He has never used smokeless tobacco. He reports that he does not drink alcohol and does not use drugs.  Allergies  Allergen Reactions   Ace Inhibitors Cough   Aspirin Other (See Comments)    Pt does not remember reaction   Latex Other (See Comments)    Pt does not remember reaction    Medications   Current Facility-Administered Medications:    0.9 %   sodium chloride infusion, , Intravenous, Continuous, Minor, Vilinda Blanks, NP, Last Rate: 100 mL/hr at 07/01/23 0600, Infusion Verify at 07/01/23 0600   0.9 %  sodium chloride infusion, 250 mL, Intravenous, Continuous, Minor, Vilinda Blanks, NP   0.9 %  sodium chloride infusion, 250 mL, Intravenous, Continuous, Agarwala, Ravi, MD, Last Rate: 10 mL/hr at 07/01/23 0600, Infusion Verify at 07/01/23 0600   acetaminophen (TYLENOL) tablet 650 mg, 650 mg, Per Tube, Q4H, 650 mg at 07/01/23 0551 **OR** acetaminophen (TYLENOL) 160 MG/5ML solution 650 mg, 650 mg, Per Tube, Q4H, 650 mg at 07/01/23 0129 **OR** acetaminophen (TYLENOL) suppository 650 mg, 650 mg, Rectal, Q4H, Agarwala, Ravi, MD   [START ON 07/02/2023] acetaminophen (TYLENOL) tablet 650 mg, 650 mg, Per Tube, Q4H PRN **OR** [START ON 07/02/2023] acetaminophen (TYLENOL) 160 MG/5ML solution 650 mg, 650 mg, Per Tube, Q4H PRN **OR** [START ON 07/02/2023] acetaminophen (TYLENOL) suppository 650 mg, 650 mg, Rectal, Q4H PRN, Agarwala, Ravi, MD   arformoterol (BROVANA) nebulizer solution 15 mcg, 15 mcg, Nebulization, BID, Agarwala, Ravi, MD, 15 mcg at 07/01/23 0723   budesonide (PULMICORT) nebulizer solution 0.25 mg, 0.25 mg, Nebulization, BID, Agarwala, Ravi, MD, 0.25 mg at 07/01/23 0720   busPIRone (BUSPAR) tablet 30 mg, 30 mg, Per Tube, Q8H PRN **OR** busPIRone (BUSPAR) tablet 30 mg, 30 mg, Per Tube, Q8H PRN, Lynnell Catalan, MD   Chlorhexidine  Gluconate Cloth 2 % PADS 6 each, 6 each, Topical, Daily, Agarwala, Ravi, MD, 6 each at 06/30/23 1240   docusate (COLACE) 50 MG/5ML liquid 100 mg, 100 mg, Per Tube, BID PRN, Lynnell Catalan, MD   docusate (COLACE) 50 MG/5ML liquid 100 mg, 100 mg, Per Tube, BID, Agarwala, Ravi, MD, 100 mg at 06/30/23 2143   fentaNYL (SUBLIMAZE) injection 50 mcg, 50 mcg, Intravenous, Q15 min PRN, Agarwala, Ravi, MD   fentaNYL (SUBLIMAZE) injection 50-200 mcg, 50-200 mcg, Intravenous, Q30 min PRN, Lynnell Catalan, MD   heparin injection 5,000 Units,  5,000 Units, Subcutaneous, Q8H, Agarwala, Ravi, MD, 5,000 Units at 07/01/23 0551   magnesium sulfate IVPB 2 g 50 mL, 2 g, Intravenous, Once PRN, Agarwala, Daleen Bo, MD   methylPREDNISolone sodium succinate (SOLU-MEDROL) 40 mg/mL injection 40 mg, 40 mg, Intravenous, Q12H, 40 mg at 07/01/23 0129 **FOLLOWED BY** [START ON 07/03/2023] methylPREDNISolone sodium succinate (SOLU-MEDROL) 40 mg/mL injection 40 mg, 40 mg, Intravenous, Daily, Agarwala, Ravi, MD   midazolam (VERSED) injection 4 mg, 4 mg, Intravenous, Q15 min PRN, Agarwala, Daleen Bo, MD, 4 mg at 07/01/23 0001   norepinephrine (LEVOPHED) 4mg  in (0.016 mg/mL) premix infusion, 2-10 mcg/min, Intravenous, Titrated, Agarwala, Ravi, MD   ondansetron (ZOFRAN) injection 4 mg, 4 mg, Intravenous, Q6H PRN, Lynnell Catalan, MD   Oral care mouth rinse, 15 mL, Mouth Rinse, Q2H, Agarwala, Ravi, MD, 15 mL at 07/01/23 0551   Oral care mouth rinse, 15 mL, Mouth Rinse, PRN, Agarwala, Ravi, MD   pantoprazole (PROTONIX) injection 40 mg, 40 mg, Intravenous, QHS, Minor, Vilinda Blanks, NP, 40 mg at 06/30/23 2215   polyethylene glycol (MIRALAX / GLYCOLAX) packet 17 g, 17 g, Per Tube, Daily PRN, Minor, Vilinda Blanks, NP   polyethylene glycol (MIRALAX / GLYCOLAX) packet 17 g, 17 g, Per Tube, Daily, Agarwala, Ravi, MD, 17 g at 06/30/23 1556   propofol (DIPRIVAN) 1000 MG/100ML infusion, 0-50 mcg/kg/min, Intravenous, Continuous, Agarwala, Ravi, MD, Last Rate: 33.9 mL/hr at 07/01/23 0600, 50 mcg/kg/min at 07/01/23 0600   revefenacin (YUPELRI) nebulizer solution 175 mcg, 175 mcg, Nebulization, Daily, Agarwala, Ravi, MD, 175 mcg at 07/01/23 0723   valproate (DEPACON) 500 mg in dextrose 5 % 50 mL IVPB, 500 mg, Intravenous, Q8H, Agarwala, Ravi, MD, Last Rate: 55 mL/hr at 07/01/23 0644, 500 mg at 07/01/23 0644  Vitals   Vitals:   07/01/23 0545 07/01/23 0600 07/01/23 0615 07/01/23 0630  BP:      Pulse: 63 65 65 65  Resp: 20 20 20 20   Temp: (!) 97.5 F (36.4 C) (!) 97.5 F (36.4 C) (!)  97.5 F (36.4 C) (!) 97.5 F (36.4 C)  SpO2: 100% 100% 100% 100%  Weight:      Height:        Body mass index is 30.48 kg/m.  Physical Exam   General: Laying comfortably in bed; in no acute distress.  HENT: Normal oropharynx and mucosa. Normal external appearance of ears and nose.  Neck: Supple, no pain or tenderness  CV: No JVD. No peripheral edema.  Pulmonary: Symmetric Chest rise. Not breathing over vent. Abdomen: Soft to touch, non-tender.  Ext: No cyanosis, edema, or deformity  Skin: No rash. Normal palpation of skin.   Musculoskeletal: Normal digits and nails by inspection. No clubbing.   Neurologic Examination performed after propofol was paused for 5 mins.  Mental status/Cognition: . Intubated, no response to voice or loud clap. Speech/language: mute, no speech. No attempts to communicate or follow commands. Cranial nerves:   CN  II Pupils equal and reactive to light, unable to assess for VF deficit.   CN III,IV,VI Dolls eyes reflex is absent. No gaze preference.   CN V Corneals weakly positive bL   CN VII No grimace to noxious stimuli   CN VIII Does not turn head towards speech   CN IX & X Cough intact, no gag.   CN XI Head midline   CN XII midline tongue   Sensory/Motor:  Muscle bulk: normal flaccid Myoclonic jerks noted to proximal pinch in BL uppers. No purposeful movements or withdrawals noted.  Coordination/Complex Motor:  Unable to assess.  Labs/Imaging/Neurodiagnostic studies   CBC:  Recent Labs  Lab July 10, 2023 1510 Jul 10, 2023 1523 2023-07-10 1755 07/01/23 0440  WBC 10.4  --   --  8.2  NEUTROABS  --   --   --  7.0  HGB 12.1*   < > 12.6* 12.0*  HCT 38.2*   < > 37.0* 37.3*  MCV 91.2  --   --  89.0  PLT 234  --   --  243   < > = values in this interval not displayed.   Basic Metabolic Panel:  Lab Results  Component Value Date   NA 140 07/01/2023   K 3.4 (L) 07/01/2023   CO2 27 07/01/2023   GLUCOSE 128 (H) 07/01/2023   BUN 17 07/01/2023    CREATININE 0.84 07/01/2023   CALCIUM 8.3 (L) 07/01/2023   GFRNONAA >60 07/01/2023   GFRAA >90 08/07/2013   Lipid Panel: No results found for: "LDLCALC" HgbA1c:  Lab Results  Component Value Date   HGBA1C 6.2 (H) 10-Jul-2023   Urine Drug Screen:     Component Value Date/Time   LABOPIA NONE DETECTED 2023/07/10 1252   COCAINSCRNUR NONE DETECTED 10-Jul-2023 1252   LABBENZ NONE DETECTED 07/10/23 1252   AMPHETMU NONE DETECTED Jul 10, 2023 1252   THCU NONE DETECTED 10-Jul-2023 1252   LABBARB NONE DETECTED 07-10-2023 1252    Alcohol Level No results found for: "ETH" INR  Lab Results  Component Value Date   INR 1.3 (H) July 10, 2023   APTT  Lab Results  Component Value Date   APTT 31 2023-07-10   AED levels: No results found for: "PHENYTOIN", "ZONISAMIDE", "LAMOTRIGINE", "LEVETIRACETA"  CT Head without contrast(Personally reviewed): The fourth ventricle is somewhat small in size, which may be secondary to cerebellar edema. No other finding to definitively suggest anoxic brain injury at the time of the exam. Recommend brain MRI for further evaluation.  MRI Brain: pending  Neurodiagnostics cEEG:  At the beginning of the study, patient was noted to have myoclonic seizures every 1-2 minutes. As medications were adjusted, clinical seizures abated. Subsequently, EEG showed profound diffuse encephalopathy.   ASSESSMENT   Robert [name] is a 62 y.o. male with hx of COPD, HTN, HLD, who per wife went upstairs, short of breath so went to lie down. During breathing treatment, he became unconscious. EMS called and was in PEA arrest. CPR x 15 mins with ROSC. He was intubated in the ED and noted to have random twitching of face and body concerning for myoclonic seizures.  He was started on Valproic acid with improvement in myoclonic seizures. No prior hx of seizures.  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  RECOMMENDATIONS  - loaded  with additional VPA 1000mg  for a total load of 20mg /kg. Agree with continuing VPA 500 Q8H. - VPA levels in aM ordered and therapeutic at 57. - MRI Brain between  day 3-5. - prognosis requires seizure control and atleast 72 hours. ______________________________________________________________________  This patient is critically ill and at significant risk of neurological worsening, death and care requires constant monitoring of vital signs, hemodynamics,respiratory and cardiac monitoring, neurological assessment, discussion with family, other specialists and medical decision making of high complexity. I spent 40 minutes of neurocritical care time  in the care of  this patient. This was time spent independent of any time provided by nurse practitioner or PA.  Erick Blinks [facility] 07/01/2023  7:52 AM   Signed, Erick Blinks, MD [facility]

## 2023-07-01 NOTE — Plan of Care (Signed)
  Problem: Activity: Goal: Risk for activity intolerance will decrease Outcome: Progressing   Problem: Safety: Goal: Ability to remain free from injury will improve Outcome: Progressing   

## 2023-07-01 NOTE — Progress Notes (Signed)
Brief cardiology follow up note:  Awaiting prognostication, which requires seizure control and 72 hours per neurology. Cardiology will follow peripherally during that time, but please contact us with any questions or concerns.  Jodelle Red, MD, PhD, [facility] [place]  [facility]  [place]  [facility] at [facility] at [facility] [address] [place], [place] [address] [phone]

## 2023-07-01 NOTE — Progress Notes (Signed)
Initial Nutrition Assessment  DOCUMENTATION CODES:  Not applicable  INTERVENTION:  Initiate tube feeding via OGT: Start Vital 1.5 at 20ml and advance by 10ml q12h to a goal rate of 60 ml/h (1440 ml per day) 60ml ProSource TF20 once daily TF at goal provides: 2240 kcal, 117g protein, free water daily   Given magnesium and phosphorus labs resulted low this morning, will titrate TF orders slowly. Due to concern for refeeding syndrome, recommend thiamine 100mg  x5 days.  NUTRITION DIAGNOSIS:  Inadequate oral intake related to acute illness as evidenced by NPO status.  GOAL:  Patient will meet greater than or equal to 90% of their needs  MONITOR:  Vent status, Labs, Weight trends, TF tolerance  REASON FOR ASSESSMENT:  Consult, Ventilator Enteral/tube feeding initiation and management  ASSESSMENT:  Pt admitted with PEA arrest secondary to COPD exacerbation. PMH significant for former smoker, COPD requiring home oxygen, HTN, HLD, non-insulin dependent T2DM, HFrEF.   Neurology following d/t concern of myoclonic seizure.   Spoke with pt's wife at bedside. She reports that over the last year, pt has been trying to eat healthier to lose some weight. She states that his appetite has been stable, at baseline. He typically eats about 2-3 meals day with many snacks in between. His portion sizes have gotten smaller over the years but no significant changes. Pt enjoys a variety of food items. He does not eat or take any pork derived foods or medications d/t religious reasons; added to allergy list.   Pt's wife reports that his weight has been stable to her knowledge though does not provide usual weight. Unfortunately there is no weight history on file to review within the last year. Pt's weight notably has declined from ~112 kg in 2021 but uncertain how recent this weight loss has occurred.   Patient is currently intubated on ventilator support MV: 12 L/min Temp (24hrs), Avg:97.8 F (36.6  C), Min:97 F (36.1 C), Max:98.4 F (36.9 C)  Admit weight: 113 kg (question whether this was actual vs stated)  Current weight: 104.8 kg   Intake/Output Summary (Last 24 hours) at 07/01/2023 1517 Last data filed at 07/01/2023 1300 Gross per 24 hour  Intake 3231.61 ml  Output 2010 ml  Net 1221.61 ml   Net IO Since Admission: 2,632.98 mL [07/01/23 1517]  Drains/Lines: OGT (tip in distal stomach)  Nutritionally Relevant Medications: Scheduled Meds:  acetaminophen  650 mg Per Tube Q4H   Or   acetaminophen (TYLENOL) oral liquid 160 mg/5 mL  650 mg Per Tube Q4H   Or   acetaminophen  650 mg Rectal Q4H   arformoterol  15 mcg Nebulization BID   budesonide (PULMICORT) nebulizer solution  0.25 mg Nebulization BID   Chlorhexidine Gluconate Cloth  6 each Topical Daily   docusate  100 mg Per Tube BID   feeding supplement (PROSource TF20)  60 mL Per Tube Daily   fondaparinux (ARIXTRA) injection  2.5 mg Subcutaneous Q24H   insulin aspart  0-9 Units Subcutaneous Q4H   methylPREDNISolone (SOLU-MEDROL) injection  40 mg Intravenous Q12H   Followed by   Melene Muller ON 07/03/2023] methylPREDNISolone (SOLU-MEDROL) injection  40 mg Intravenous Daily   mouth rinse  15 mL Mouth Rinse Q2H   pantoprazole (PROTONIX) IV  40 mg Intravenous QHS   polyethylene glycol  17 g Per Tube Daily   revefenacin  175 mcg Nebulization Daily   thiamine  100 mg Per Tube Daily   Continuous Infusions:  sodium chloride 100 mL/hr at  07/01/23 1300   cefTRIAXone (ROCEPHIN)  IV 2 g (07/01/23 1433)   feeding supplement (VITAL 1.5 CAL)     magnesium sulfate     magnesium sulfate bolus IVPB     norepinephrine (LEVOPHED) Adult infusion     propofol (DIPRIVAN) infusion 40 mcg/kg/min (07/01/23 1300)   valproate sodium Stopped (07/01/23 0746)   PRN Meds:.[START ON 07/02/2023] acetaminophen **OR** [START ON 07/02/2023] acetaminophen (TYLENOL) oral liquid 160 mg/5 mL **OR** [START ON 07/02/2023] acetaminophen, busPIRone **OR**  busPIRone, docusate, fentaNYL (SUBLIMAZE) injection, fentaNYL (SUBLIMAZE) injection, labetalol, magnesium sulfate, midazolam, ondansetron (ZOFRAN) IV, mouth rinse, polyethylene glycol  Labs Reviewed: Potassium 3.8 (wdl) Ionized Ca 1.11 Phos 1.6 Mg 1.6 CBG ranges from 102-180 mg/dL over the last 24 hours HgbA1c 6.2%  NUTRITION - FOCUSED PHYSICAL EXAM:  Flowsheet Row Most Recent Value  Orbital Region Unable to assess  Upper Arm Region No depletion  Thoracic and Lumbar Region No depletion  Buccal Region Unable to assess  Temple Region Mild depletion  Clavicle Bone Region No depletion  Clavicle and Acromion Bone Region No depletion  Scapular Bone Region No depletion  Dorsal Hand No depletion  Patellar Region No depletion  Anterior Thigh Region No depletion  Posterior Calf Region Unable to assess  [mild pitting edema]  Edema (RD Assessment) Moderate  [bilateral feet]  Hair Reviewed  Eyes Unable to assess  Mouth Unable to assess  Skin Reviewed  Nails Reviewed       Diet Order:   Diet Order             Diet NPO time specified  Diet effective now                   EDUCATION NEEDS:  Education needs have been addressed  Skin:  Skin Assessment: Reviewed RN Assessment  Last BM:  unknown/PTA  Height:  Ht Readings from Last 1 Encounters:  06/30/23 6\' 1"  (1.854 m)    Weight:  Wt Readings from Last 1 Encounters:  07/01/23 104.8 kg    Ideal Body Weight:  83.6 kg  BMI:  Body mass index is 30.48 kg/m.  Estimated Nutritional Needs:  Kcal:  2100-2300 Protein:  115-130g Fluid:  >/=2L  Drusilla Kanner, RDN, LDN Clinical Nutrition

## 2023-07-01 NOTE — Procedures (Signed)
Patient Name: Robert [name]  MRN: [id number]  Epilepsy Attending: Charlsie Quest  Referring Physician/Provider: Lynnell Catalan, MD  Duration: 06/30/2023 1339 to 07/01/2023 1339  Patient history: 62 yo M S/p cardiac arrest getting eeg to evaluate for seizure  Level of alertness: comatose  AEDs during EEG study: VPA, propofol  Technical aspects: This EEG study was done with scalp electrodes positioned according to the 10-20 International system of electrode placement. Electrical activity was reviewed with band pass filter of 1-70Hz , sensitivity of 7 uV/mm, display speed of 28mm/sec with a 60Hz  notched filter applied as appropriate. EEG data were recorded continuously and digitally stored.  Video monitoring was available and reviewed as appropriate.  Description: At the beginning of the study, patient was noted to have episodes of brief sudden whole body jerking every 1-2 minutes Concomitant EEG showed generalized polyspikes consistent with myoclonic seizures. In between seizures EEG showed generalized background suppression. As medications were adjusted, clinical seizures abated. EEG then showed burst suppression pattern with bursts of generalized 3-7hz  theta-delta slowing lasting 2-5 seconds alternating with generalized suppression lasting 2-12 seconds. Gradually the duration of bursts reduced and was lasting only 0.5 second. Hyperventilation and photic stimulation were not performed.     ABNORMALITY - Myoclonic seizure, generalized - Burst suppression, generalized  IMPRESSION: At the beginning of the study, patient was noted to have myoclonic seizures every 1-2 minutes. As medications were adjusted, clinical seizures abated. Subsequently, EEG showed profound diffuse encephalopathy.   Dr. Denese Killings was notified  Charlsie Quest

## 2023-07-02 DIAGNOSIS — J989 Respiratory disorder, unspecified: Secondary | ICD-10-CM | POA: Diagnosis not present

## 2023-07-02 DIAGNOSIS — I468 Cardiac arrest due to other underlying condition: Secondary | ICD-10-CM | POA: Diagnosis not present

## 2023-07-02 LAB — GLUCOSE, CAPILLARY
Glucose-Capillary: 145 mg/dL — ABNORMAL HIGH (ref 70–99)
Glucose-Capillary: 150 mg/dL — ABNORMAL HIGH (ref 70–99)
Glucose-Capillary: 126 mg/dL — ABNORMAL HIGH (ref 70–99)
Glucose-Capillary: 203 mg/dL — ABNORMAL HIGH (ref 70–99)
Glucose-Capillary: 203 mg/dL — ABNORMAL HIGH (ref 70–99)
Glucose-Capillary: 213 mg/dL — ABNORMAL HIGH (ref 70–99)

## 2023-07-02 LAB — POCT I-STAT 7, (LYTES, BLD GAS, ICA,H+H)
Acid-Base Excess: 8 mmol/L — ABNORMAL HIGH (ref 0.0–2.0)
Bicarbonate: 32.7 mmol/L — ABNORMAL HIGH (ref 20.0–28.0)
Calcium, Ion: 1.09 mmol/L — ABNORMAL LOW (ref 1.15–1.40)
HCT: 37 % — ABNORMAL LOW (ref 39.0–52.0)
Hemoglobin: 12.6 g/dL — ABNORMAL LOW (ref 13.0–17.0)
O2 Saturation: 97 %
Patient temperature: 36.8
Potassium: 3.8 mmol/L (ref 3.5–5.1)
Sodium: 140 mmol/L (ref 135–145)
TCO2: 34 mmol/L — ABNORMAL HIGH (ref 22–32)
pCO2 arterial: 44.9 mm[Hg] (ref 32–48)
pH, Arterial: 7.47 — ABNORMAL HIGH (ref 7.35–7.45)
pO2, Arterial: 84 mm[Hg] (ref 83–108)

## 2023-07-02 LAB — BASIC METABOLIC PANEL
Anion gap: 11 (ref 5–15)
BUN: 20 mg/dL (ref 8–23)
CO2: 27 mmol/L (ref 22–32)
Calcium: 7.9 mg/dL — ABNORMAL LOW (ref 8.9–10.3)
Chloride: 100 mmol/L (ref 98–111)
Creatinine, Ser: 0.78 mg/dL (ref 0.61–1.24)
GFR, Estimated: >60 mL/min (ref >60)
Glucose, Bld: 147 mg/dL — ABNORMAL HIGH (ref 70–99)
Potassium: 3.7 mmol/L (ref 3.5–5.1)
Sodium: 138 mmol/L (ref 135–145)

## 2023-07-02 LAB — CBC WITH DIFFERENTIAL/PLATELET
Abs Immature Granulocytes: 0.05 10*3/uL (ref 0.00–0.07)
Basophils Absolute: 0 10*3/uL (ref 0.0–0.1)
Basophils Relative: 0 %
Eosinophils Absolute: 0 10*3/uL (ref 0.0–0.5)
Eosinophils Relative: 0 %
HCT: 37.4 % — ABNORMAL LOW (ref 39.0–52.0)
Hemoglobin: 12.1 g/dL — ABNORMAL LOW (ref 13.0–17.0)
Immature Granulocytes: 1 %
Lymphocytes Relative: 6 %
Lymphs Abs: 0.7 10*3/uL (ref 0.7–4.0)
MCH: 29.4 pg (ref 26.0–34.0)
MCHC: 32.4 g/dL (ref 30.0–36.0)
MCV: 90.8 fL (ref 80.0–100.0)
Monocytes Absolute: 0.6 10*3/uL (ref 0.1–1.0)
Monocytes Relative: 5 %
Neutro Abs: 9.2 10*3/uL — ABNORMAL HIGH (ref 1.7–7.7)
Neutrophils Relative %: 88 %
Platelets: 246 10*3/uL (ref 150–400)
RBC: 4.12 MIL/uL — ABNORMAL LOW (ref 4.22–5.81)
RDW: 15.1 % (ref 11.5–15.5)
WBC: 10.5 10*3/uL (ref 4.0–10.5)
nRBC: 0 % (ref 0.0–0.2)

## 2023-07-02 LAB — PHOSPHORUS
Phosphorus: 3.6 mg/dL (ref 2.5–4.6)
Phosphorus: 3.2 mg/dL (ref 2.5–4.6)

## 2023-07-02 LAB — MAGNESIUM: Magnesium: 2.3 mg/dL (ref 1.7–2.4)

## 2023-07-02 MED ORDER — POTASSIUM CHLORIDE 20 MEQ PO PACK
40.0000 meq | PACK | Freq: Once | ORAL | Status: AC
Start: 2023-07-02 — End: 2023-07-02
  Administered 2023-07-02: 40 meq
  Filled 2023-07-02: qty 2

## 2023-07-02 MED ORDER — DEXMEDETOMIDINE HCL IN NACL 400 MCG/100ML IV SOLN
0.0000 ug/kg/h | INTRAVENOUS | Status: DC
Start: 2023-07-02 — End: 2023-07-04
  Administered 2023-07-02: 0.7 ug/kg/h via INTRAVENOUS
  Administered 2023-07-02: 0.8 ug/kg/h via INTRAVENOUS
  Administered 2023-07-02: 0.5 ug/kg/h via INTRAVENOUS
  Administered 2023-07-02: 0.4 ug/kg/h via INTRAVENOUS
  Administered 2023-07-02: 0.5 ug/kg/h via INTRAVENOUS
  Administered 2023-07-03: 0.6 ug/kg/h via INTRAVENOUS
  Administered 2023-07-03: 0.8 ug/kg/h via INTRAVENOUS
  Administered 2023-07-04: 0.5 ug/kg/h via INTRAVENOUS
  Administered 2023-07-04 (×2): 0.4 ug/kg/h via INTRAVENOUS
  Filled 2023-07-02 (×9): qty 100

## 2023-07-02 NOTE — Progress Notes (Addendum)
NEUROLOGY CONSULT FOLLOW UP NOTE   Date of service: July 02, 2023 Patient Name: Robert [name] MRN:  [id number] DOB:  15-Jul-1961  Brief HPI  Robert [name] is a 62 y.o. male  has a past medical history of ACE-inhibitor cough, Acute respiratory failure [facility]) (January 2015), COPD with emphysema (HCC), Hiatal hernia, Hyperlipidemia, Hypertension, Nephrolithiasis, Nocturnal hypoxemia due to emphysema [facility]), Pulmonary nodule, right (12/10/2010), and Seasonal allergies. who presented with post cardiac arrest. He became short of breath at home after walking up a flight of stairs. Wife administered a breathing treatment, he then went unresponsive. He maintained a pulse until EMS arrived at which time no pulse was detected. Therefore PEA protocol was implemented with chest compressions bagging and after epinephrine was administered he was transferred to [facility] and intubated.    Interval Hx/subjective   Remains intubated on LTM. Received VPA load of 20mg /kg (1000mg ) and is now receiving 500mg  q8hr.  Currently on propofol 12mcg/kg/min and is burst suppressed.     Vitals   Vitals:   07/02/23 0500 07/02/23 0515 07/02/23 0530 07/02/23 0600  BP:      Pulse: 80 81 86 87  Resp: 16 16 16 16   Temp: 98.4 F (36.9 C) 98.4 F (36.9 C) 98.4 F (36.9 C) 98.6 F (37 C)  TempSrc:      SpO2: 96% 96% 97% 97%  Weight:      Height:         Body mass index is 55.5 kg/m.  Physical Exam   General: Laying comfortably in bed; in no acute distress.  HENT: Normal oropharynx and mucosa. Normal external appearance of ears and nose. ETT in place Neck: Supple, no pain or tenderness  CV: No JVD. No peripheral edema.  Pulmonary: Symmetric Chest rise. Not breathing over vent. Abdomen: Soft to touch, non-tender.  Ext: No cyanosis, edema, or deformity  Skin: No rash. Normal palpation of skin.   Musculoskeletal: Normal digits and nails by inspection. No clubbing.    Neurologic Examination performed  on propofol  Mental status/Cognition: . Intubated, no response to voice or loud clap. Speech/language: mute, no speech. No attempts to communicate or follow commands. Cranial nerves:   CN II Pupils equal and reactive to light, unable to assess for VF deficit.   CN III,IV,VI Dolls eyes reflex is absent. No gaze preference.   CN V Corneals weakly positive bL   CN VII No grimace to noxious stimuli   CN VIII Does not turn head towards speech   CN IX & X Cough intact, no gag.   CN XI Head midline   CN XII midline tongue    Sensory/Motor:  Muscle bulk: normal flaccid No myoclonic jerking with painful stimuli Coordination/Complex Motor:  Unable to assess.  On attending exam on Precedex after 20 minutes of propofol being completely off, very similar exam other than weak/incomplete VOR with mildly disconjugate gaze.  He did become uncomfortable on the vent after suctioning    Labs and Diagnostic Imaging   CBC:  Recent Labs  Lab 07/01/23 0440 07/01/23 0944 07/02/23 0328 07/02/23 0400  WBC 8.2  --   --  10.5  NEUTROABS 7.0  --   --  9.2*  HGB 12.0*   < > 12.6* 12.1*  HCT 37.3*   < > 37.0* 37.4*  MCV 89.0  --   --  90.8  PLT 243  --   --  246   < > = values in this interval not displayed.  Basic Metabolic Panel:  Lab Results  Component Value Date   NA 138 07/02/2023   K 3.7 07/02/2023   CO2 27 07/02/2023   GLUCOSE 147 (H) 07/02/2023   BUN 20 07/02/2023   CREATININE 0.78 07/02/2023   CALCIUM 7.9 (L) 07/02/2023   GFRNONAA >60 07/02/2023   GFRAA >90 08/07/2013   Lipid Panel:  Lab Results  Component Value Date   LDLCALC 73 07/01/2023   HgbA1c:  Lab Results  Component Value Date   HGBA1C 6.2 (H) 07/01/2023   Urine Drug Screen:     Component Value Date/Time   LABOPIA NONE DETECTED 06/30/2023 1252   COCAINSCRNUR NONE DETECTED 06/30/2023 1252   LABBENZ NONE DETECTED 06/30/2023 1252   AMPHETMU NONE DETECTED 06/30/2023 1252   THCU NONE DETECTED 06/30/2023 1252    LABBARB NONE DETECTED 06/30/2023 1252    Alcohol Level No results found for: "ETH" INR  Lab Results  Component Value Date   INR 1.3 (H) 06/30/2023   APTT  Lab Results  Component Value Date   APTT 31 06/30/2023   AED levels: No results found for: "PHENYTOIN", "ZONISAMIDE", "LAMOTRIGINE", "LEVETIRACETA"  CT Head without contrast(Personally reviewed): The fourth ventricle is somewhat small in size, which may be secondary to cerebellar edema. No other finding to definitively suggest anoxic brain injury at the time of the exam. Recommend brain MRI for further evaluation.  MRI Brain(Personally reviewed): Planned for 12/8  rEEG: 06/30/2023 1339 to 07/01/2023 0715  ABNORMALITY - Myoclonic seizure, generalized - Burst suppression, generalized IMPRESSION: At the beginning of the study, patient was noted to have myoclonic seizures every 1-2 minutes. As medications were adjusted, clinical seizures abated. Subsequently, EEG showed profound diffuse encephalopathy.   Impression   Robert [name] is a 62 y.o. male present after a cardiac arrest. EEG showing myoclonic seizures every 1-2 minutes. He was started on depakote and propofol and is now burst suppressed. Plan for prognostication after 12/8. In the mean time it would be helpful to switch sedation to precedex to evaluate for seizure control.  Examination is notable for intact brainstem reflexes but otherwise no clear sign of any cortical activity.  Even off of propofol, EEG remains markedly suppressed at this time, concerning for potential cortical injury but it is too early to prognosticate at this time  Attending MD briefly discussed with wife at bedside but called away by emergent code stroke evaluation  Recommendations  - Precedex to be started today. Will monitor EEG for myoclonic activity during transition off propofol  - If further seizure activity off of propofol would start Keppra 2000 mg loading dose followed by 1000 mg twice  daily - If LTM EEG remains negative for seizure activity today will likely discontinue tomorrow, obtain MRI at that point and plan formal discussion regarding prognosis - Briefly discussed with wife at bedside, discussed with CCM via secure chat ______________________________________________________________________   Thank you for the opportunity to take part in the care of this patient. If you have any further questions, please contact the neurology consultation team on call. Updated oncall schedule is listed on AMION.  Signed,  Elmer Picker  Attending Neurologist's note:  I personally saw this patient, gathering history, performing a full neurologic examination, reviewing relevant labs, personally reviewing relevant imaging including EEG (spot review at bedside), and formulated the assessment and plan, adding the note above for completeness and clarity to accurately reflect my thoughts  CRITICAL CARE Performed by: Gordy Councilman   Total critical care time: 30 minutes  Critical care time was exclusive of separately billable procedures and treating other patients.  Critical care was necessary to treat or prevent imminent or life-threatening deterioration.  Critical care was time spent personally by me on the following activities: development of treatment plan with patient and/or surrogate as well as nursing, discussions with consultants, evaluation of patient's response to treatment, examination of patient, obtaining history from patient or surrogate, ordering and performing treatments and interventions, ordering and review of laboratory studies, ordering and review of radiographic studies, pulse oximetry and re-evaluation of patient's condition.

## 2023-07-02 NOTE — Procedures (Signed)
Patient Name: Robert [name]  MRN: [id number]  Epilepsy Attending: Charlsie Quest  Referring Physician/Provider: Lynnell Catalan, MD  Duration: 07/01/2023 1339 to 07/02/2023 1339   Patient history: 62 yo M S/p cardiac arrest getting eeg to evaluate for seizure   Level of alertness: comatose   AEDs during EEG study: VPA, propofol   Technical aspects: This EEG study was done with scalp electrodes positioned according to the 10-20 International system of electrode placement. Electrical activity was reviewed with band pass filter of 1-70Hz , sensitivity of 7 uV/mm, display speed of 72mm/sec with a 60Hz  notched filter applied as appropriate. EEG data were recorded continuously and digitally stored.  Video monitoring was available and reviewed as appropriate.   Description: At the beginning of the study, EEG showed burst suppression pattern with bursts of generalized 2-3hz  delta slowing lasting 0.5 seconds alternating with generalized suppression lasting 2-12 seconds. Gradually the inter burst interval became longer and eventually EEG showed near continuous generalized suppression, not reactive to stimulation. Hyperventilation and photic stimulation were not performed.      ABNORMALITY - Burst suppression, generalized   IMPRESSION: This study is suggestive of profound diffuse encephalopathy. No seizures were noted.   EEG appears to be worsening compared to previous day.   [name]

## 2023-07-02 NOTE — Progress Notes (Signed)
LTM maint complete - no skin breakdown under:  Pz, Fp1, A1

## 2023-07-02 NOTE — Progress Notes (Signed)
[facility] ADULT ICU REPLACEMENT PROTOCOL   The patient does apply for the [facility] Adult ICU Electrolyte Replacment Protocol based on the criteria listed below:   1.Exclusion criteria: TCTS, ECMO, Dialysis, and Myasthenia Gravis patients 2. Is GFR >/= 30 ml/min? Yes.    Patient's GFR today is >60 3. Is SCr </= 2? Yes.   Patient's SCr is 0.78 mg/dL 4. Did SCr increase >/= 0.5 in 24 hours? No. 5.Pt's weight >40kg  Yes.   6. Abnormal electrolyte(s):   K 3.7  7. Electrolytes replaced per protocol 8.  Call MD STAT for K+ </= 2.5, Phos </= 1, or Mag </= 1 Physician:  A. Florestine Avers R Tilak Oakley 07/02/2023 4:52 AM

## 2023-07-02 NOTE — Progress Notes (Signed)
NAME:  Robert [name], MRN:  [id number], DOB:  November 18, 1960, LOS: 2 ADMISSION DATE:  06/30/2023, CONSULTATION DATE:  06/30/23 REFERRING MD:  12/05 CHIEF COMPLAINT:  Cardiac Arrest   History of Present Illness:  Robert [name] is a 62 year old male former smoker with known COPD that is oxygen dependent and is followed by Dr. Craige Cotta in the [facility].  He was in his usual state of poor health with shortness of breath and after walking up a set of steps he asked his wife for breathing treatment and while administering the breathing treatment she noticed that he became unconscious.  He maintained a pulse until EMS arrived at which time no pulse was detected.  Therefore PEA protocol was implemented with chest compressions bagging and after epinephrine was administered he was transferred to [facility] and intubated.  Initially was on epinephrine drip Levophed drip but now is on no vasopressor support at all.  Extremely acidotic with a pH of 7.1 for which she was given bicarbonate x 2 and respiratory rate was increased to 34.  He is off sedation and is not following commands.  He will need a head CT and possibly further neurological evaluations.  He has orders written to go to the coronary intensive care unit.  Pertinent  Medical History  Chronic hypoxic respiratory failure 2/2 to COPD HTN HLD T2DM Significant Hospital Events: Including procedures, antibiotic start and stop dates in addition to other pertinent events    12/05-admitted to ICU, intubated Interim History / Subjective:   Remains on the ventilator.  No acute events overnight.  Off pressors.  Objective   Blood pressure 126/77, pulse 87, temperature 98.6 F (37 C), resp. rate 16, height 6\' 1"  (1.854 m), weight (!) 190.8 kg, SpO2 97%.    Vent Mode: PRVC FiO2 (%):  [30 %] 30 % Set Rate:  [16 bmp] 16 bmp Vt Set:  [630 mL] 630 mL PEEP:  [5 cmH20] 5 cmH20 Plateau Pressure:  [17 cmH20-20 cmH20] 17 cmH20   Intake/Output Summary (Last  24 hours) at 07/02/2023 0733 Last data filed at 07/02/2023 0600 Gross per 24 hour  Intake 2523.08 ml  Output 525 ml  Net 1998.08 ml   Filed Weights   06/30/23 1033 07/01/23 0441 07/02/23 0402  Weight: 113 kg 104.8 kg (!) 190.8 kg    Examination: Blood pressure 126/77, pulse 87, temperature 98.6 F (37 C), resp. rate 16, height 6\' 1"  (1.854 m), weight (!) 190.8 kg, SpO2 97%. Gen:      No acute distress HEENT:  EOMI, sclera anicteric Neck:     No masses; no thyromegaly, ETT Lungs:    Clear to auscultation bilaterally; normal respiratory effort CV:         Regular rate and rhythm; no murmurs Abd:      + bowel sounds; soft, non-tender; no palpable masses, no distension Ext:    No edema; adequate peripheral perfusion Skin:      Warm and dry; no rash Neuro: Unresponsive  Lab/imaging reviewed Significant for stable BMP and CBC   Labs   CBC: Recent Labs  Lab 06/30/23 1003 06/30/23 1102 06/30/23 1510 06/30/23 1523 07/01/23 0440 07/01/23 0944 07/01/23 1304 07/02/23 0328 07/02/23 0400  WBC 10.6*  --  10.4  --  8.2  --   --   --  10.5  NEUTROABS  --   --   --   --  7.0  --   --   --  9.2*  HGB 13.6   < >  12.1*   < > 12.0* 12.6* 12.6* 12.6* 12.1*  HCT 46.9   < > 38.2*   < > 37.3* 37.0* 37.0* 37.0* 37.4*  MCV 99.2  --  91.2  --  89.0  --   --   --  90.8  PLT 325  --  234  --  243  --   --   --  246   < > = values in this interval not displayed.   Basic Metabolic Panel: Recent Labs  Lab 06/30/23 1003 06/30/23 1102 06/30/23 1510 06/30/23 1523 07/01/23 0439 07/01/23 0440 07/01/23 0944 07/01/23 1304 07/01/23 1718 07/02/23 0328 07/02/23 0400  NA 139   < >  --    < >  --  140 141 141 142 140 138  K 4.6   < >  --    < >  --  3.4* 3.2* 3.8 3.8 3.8 3.7  CL 92*  --   --   --   --  100  --   --  103  --  100  CO2 33*  --   --   --   --  27  --   --  30  --  27  GLUCOSE 191*  --   --   --   --  128*  --   --  148*  --  147*  BUN 15  --   --   --   --  17  --   --  17  --  20   CREATININE 1.32*  --  1.11  --   --  0.84  --   --  0.81  --  0.78  CALCIUM 8.6*  --   --   --   --  8.3*  --   --  8.1*  --  7.9*  MG 2.1  --   --   --   --  1.6*  --   --  2.8*  --  2.3  PHOS 8.2*  --   --   --  1.6*  --   --   --  2.7  --  3.6   < > = values in this interval not displayed.   GFR: Estimated Creatinine Clearance: 168.3 mL/min (by C-G formula based on SCr of 0.78 mg/dL). Recent Labs  Lab 06/30/23 1003 06/30/23 1014 06/30/23 1510 06/30/23 2038 07/01/23 0439 07/01/23 0440 07/02/23 0400  PROCALCITON <0.10  --  1.94  --  3.70  --   --   WBC 10.6*  --  10.4  --   --  8.2 10.5  LATICACIDVEN  --  6.2*  --  2.0*  --  <0.3*  --    Liver Function Tests: Recent Labs  Lab 06/30/23 1003 07/01/23 0440  AST 31 31  ALT 17 19  ALKPHOS 75 56  BILITOT 1.2* 1.0  PROT 7.0 5.9*  ALBUMIN 3.8 2.9*   No results for input(s): "LIPASE", "AMYLASE" in the last 168 hours. No results for input(s): "AMMONIA" in the last 168 hours. ABG    Component Value Date/Time   PHART 7.470 (H) 07/02/2023 0328   PCO2ART 44.9 07/02/2023 0328   PO2ART 84 07/02/2023 0328   HCO3 32.7 (H) 07/02/2023 0328   TCO2 34 (H) 07/02/2023 0328   O2SAT 97 07/02/2023 0328    Coagulation Profile: Recent Labs  Lab 06/30/23 1003 06/30/23 1530  INR 1.3* 1.3*   Cardiac Enzymes: No results for input(s): "CKTOTAL", "CKMB", "  CKMBINDEX", "TROPONINI" in the last 168 hours. HbA1C: Hgb A1c MFr Bld  Date/Time Value Ref Range Status  07/01/2023 04:39 AM 6.2 (H) 4.8 - 5.6 % Final    Comment:    (NOTE) Pre diabetes:          5.7%-6.4%  Diabetes:              >6.4%  Glycemic control for   <7.0% adults with diabetes   06/30/2023 03:10 PM 6.2 (H) 4.8 - 5.6 % Final    Comment:    (NOTE) Pre diabetes:          5.7%-6.4%  Diabetes:              >6.4%  Glycemic control for   <7.0% adults with diabetes    CBG: Recent Labs  Lab 07/01/23 1520 07/01/23 1931 07/01/23 2353 07/02/23 0340 07/02/23 0717   GLUCAP 142* 158* 172* 145* 150*    Consults  Neurology Cardiology Assessment & Plan:  Principal Problem:   Cardiac arrest due to respiratory disorder (HCC) Active Problems:   Acute hypoxic on chronic hypercapnic respiratory failure (HCC)   COPD (chronic obstructive pulmonary disease) (HCC)   Myoclonus  PEA arrest secondary to COPD exacerbation with unknown downtime Respiratory Acidosis HF with reduced EF Pt presented after PEA arrest with prolonged down time. Pt's ABG shows at 7.2/96/401. LA was 6.2. This improved with ventilation and ABG showing 7.5/41/8. Currently on nebulizers and IV steroids. CTH without anoxic changes but some concern for cerebellar edema. EEG showed seizures that were ablated with IV medications. Currently on valproate. Cardiology and neurology following. Plan to repeat MRI after good seizure control for 72 hours. Will continue current supportive measures.   Blood cultures with gram-positive rods which are likely contaminant.  Started on ceftriaxone empirically.  Follow cultures to completion. Continue tube feeds.  Chronic Problems: HTN: Holding home BP meds for now HLD: restart home lipitor 80 mg. Lipid panel pending.  Prediabetes vs diabetes: A1c pending. sSSI given steroids.   Best Practice (right click and "Reselect all SmartList Selections" daily)  Diet/type: tubefeeds DVT prophylaxis: other.  On Arixtra as he does not want to use pork products. GI prophylaxis: PPI Lines: Arterial Line Foley:  Yes, and it is still needed Continuous: Propfol  Code Status:  full code Last date of multidisciplinary goals of care discussion [12/06]  The patient is critically ill with multiple organ system failure and requires high complexity decision making for assessment and support, frequent evaluation and titration of therapies, advanced monitoring, review of radiographic studies and interpretation of complex data.   Critical Care Time devoted to patient care  services, exclusive of separately billable procedures, described in this note is 35 minutes.   Chilton Greathouse MD [facility] See Amion for pager  If no response to pager , please call [phone] until 7pm After 7:00 pm call Elink  [phone] 07/02/2023, 7:48 AM

## 2023-07-03 ENCOUNTER — Inpatient Hospital Stay (HOSPITAL_COMMUNITY): Payer: No Typology Code available for payment source

## 2023-07-03 DIAGNOSIS — G253 Myoclonus: Secondary | ICD-10-CM

## 2023-07-03 DIAGNOSIS — I468 Cardiac arrest due to other underlying condition: Secondary | ICD-10-CM | POA: Diagnosis not present

## 2023-07-03 DIAGNOSIS — J989 Respiratory disorder, unspecified: Secondary | ICD-10-CM | POA: Diagnosis not present

## 2023-07-03 LAB — CBC WITH DIFFERENTIAL/PLATELET
Abs Immature Granulocytes: 0.04 10*3/uL (ref 0.00–0.07)
Basophils Absolute: 0 10*3/uL (ref 0.0–0.1)
Basophils Relative: 0 %
Eosinophils Absolute: 0 10*3/uL (ref 0.0–0.5)
Eosinophils Relative: 0 %
HCT: 43.5 % (ref 39.0–52.0)
Hemoglobin: 13.8 g/dL (ref 13.0–17.0)
Immature Granulocytes: 0 %
Lymphocytes Relative: 6 %
Lymphs Abs: 0.6 10*3/uL — ABNORMAL LOW (ref 0.7–4.0)
MCH: 29.3 pg (ref 26.0–34.0)
MCHC: 31.7 g/dL (ref 30.0–36.0)
MCV: 92.4 fL (ref 80.0–100.0)
Monocytes Absolute: 0.6 10*3/uL (ref 0.1–1.0)
Monocytes Relative: 7 %
Neutro Abs: 8.2 10*3/uL — ABNORMAL HIGH (ref 1.7–7.7)
Neutrophils Relative %: 87 %
Platelets: 271 10*3/uL (ref 150–400)
RBC: 4.71 MIL/uL (ref 4.22–5.81)
RDW: 14.8 % (ref 11.5–15.5)
WBC: 9.4 10*3/uL (ref 4.0–10.5)
nRBC: 0 % (ref 0.0–0.2)

## 2023-07-03 LAB — GLUCOSE, CAPILLARY
Glucose-Capillary: 202 mg/dL — ABNORMAL HIGH (ref 70–99)
Glucose-Capillary: 262 mg/dL — ABNORMAL HIGH (ref 70–99)
Glucose-Capillary: 164 mg/dL — ABNORMAL HIGH (ref 70–99)
Glucose-Capillary: 127 mg/dL — ABNORMAL HIGH (ref 70–99)
Glucose-Capillary: 167 mg/dL — ABNORMAL HIGH (ref 70–99)
Glucose-Capillary: 153 mg/dL — ABNORMAL HIGH (ref 70–99)

## 2023-07-03 LAB — CULTURE, RESPIRATORY W GRAM STAIN
Culture: NORMAL
Gram Stain: NONE SEEN

## 2023-07-03 LAB — MAGNESIUM: Magnesium: 2.2 mg/dL (ref 1.7–2.4)

## 2023-07-03 LAB — PHOSPHORUS
Phosphorus: 3.1 mg/dL (ref 2.5–4.6)
Phosphorus: 3.5 mg/dL (ref 2.5–4.6)

## 2023-07-03 LAB — POCT I-STAT 7, (LYTES, BLD GAS, ICA,H+H)
Acid-Base Excess: 7 mmol/L — ABNORMAL HIGH (ref 0.0–2.0)
Bicarbonate: 30.1 mmol/L — ABNORMAL HIGH (ref 20.0–28.0)
Calcium, Ion: 1.1 mmol/L — ABNORMAL LOW (ref 1.15–1.40)
HCT: 39 % (ref 39.0–52.0)
Hemoglobin: 13.3 g/dL (ref 13.0–17.0)
O2 Saturation: 97 %
Patient temperature: 38
Potassium: 5.3 mmol/L — ABNORMAL HIGH (ref 3.5–5.1)
Sodium: 140 mmol/L (ref 135–145)
TCO2: 31 mmol/L (ref 22–32)
pCO2 arterial: 39.9 mm[Hg] (ref 32–48)
pH, Arterial: 7.489 — ABNORMAL HIGH (ref 7.35–7.45)
pO2, Arterial: 84 mm[Hg] (ref 83–108)

## 2023-07-03 LAB — BASIC METABOLIC PANEL
Anion gap: 9 (ref 5–15)
BUN: 17 mg/dL (ref 8–23)
CO2: 26 mmol/L (ref 22–32)
Calcium: 8.4 mg/dL — ABNORMAL LOW (ref 8.9–10.3)
Chloride: 104 mmol/L (ref 98–111)
Creatinine, Ser: 0.8 mg/dL (ref 0.61–1.24)
GFR, Estimated: >60 mL/min (ref >60)
Glucose, Bld: 266 mg/dL — ABNORMAL HIGH (ref 70–99)
Potassium: 4.9 mmol/L (ref 3.5–5.1)
Sodium: 139 mmol/L (ref 135–145)

## 2023-07-03 MED ORDER — HYDRALAZINE HCL 20 MG/ML IJ SOLN
10.0000 mg | INTRAMUSCULAR | Status: DC | PRN
  Administered 2023-07-03 (×2): 10 mg via INTRAVENOUS
  Filled 2023-07-03 (×2): qty 1

## 2023-07-03 MED ORDER — VITAL 1.5 CAL PO LIQD
1000.0000 mL | ORAL | Status: DC
  Administered 2023-07-03: 1000 mL

## 2023-07-03 MED ORDER — ACETAMINOPHEN 10 MG/ML IV SOLN
1000.0000 mg | Freq: Once | INTRAVENOUS | Status: AC
  Administered 2023-07-03: 1000 mg via INTRAVENOUS
  Filled 2023-07-03: qty 100

## 2023-07-03 NOTE — Procedures (Signed)
Patient Name: Robert [name]  MRN: [id number]  Epilepsy Attending: Charlsie Quest  Referring Physician/Provider: Lynnell Catalan, MD  Duration: 07/02/2023 1339 to 07/03/2023 0865   Patient history: 62 yo M S/p cardiac arrest getting eeg to evaluate for seizure   Level of alertness: comatose   AEDs during EEG study: VPA   Technical aspects: This EEG study was done with scalp electrodes positioned according to the 10-20 International system of electrode placement. Electrical activity was reviewed with band pass filter of 1-70Hz , sensitivity of 7 uV/mm, display speed of 91mm/sec with a 60Hz  notched filter applied as appropriate. EEG data were recorded continuously and digitally stored.  Video monitoring was available and reviewed as appropriate.   Description: EEG showed continuous generalized suppression, not reactive to stimulation. Hyperventilation and photic stimulation were not performed.      ABNORMALITY - Background suppression, generalized   IMPRESSION: This study is suggestive of profound diffuse encephalopathy. No seizures were noted.   Caid Radin Annabelle Harman

## 2023-07-03 NOTE — Progress Notes (Addendum)
NAME:  Robert [name], MRN:  [id number], DOB:  10/23/1960, LOS: 3 ADMISSION DATE:  06/30/2023, CONSULTATION DATE:  06/30/23 REFERRING MD:  12/05 CHIEF COMPLAINT:  Cardiac Arrest   History of Present Illness:  Robert [name] is a 62 year old male former smoker with known COPD that is oxygen dependent and is followed by Dr. Craige Cotta in the [facility].  He was in his usual state of poor health with shortness of breath and after walking up a set of steps he asked his wife for breathing treatment and while administering the breathing treatment she noticed that he became unconscious.  He maintained a pulse until EMS arrived at which time no pulse was detected.  Therefore PEA protocol was implemented with chest compressions bagging and after epinephrine was administered he was transferred to [facility] and intubated.  Initially was on epinephrine drip Levophed drip but now is on no vasopressor support at all.  Extremely acidotic with a pH of 7.1 for which she was given bicarbonate x 2 and respiratory rate was increased to 34.  He is off sedation and is not following commands.  He will need a head CT and possibly further neurological evaluations.  He has orders written to go to the coronary intensive care unit.  Pertinent  Medical History  Chronic hypoxic respiratory failure 2/2 to COPD HTN HLD T2DM Significant Hospital Events: Including procedures, antibiotic start and stop dates in addition to other pertinent events   12/05-admitted to ICU, intubated Interim History / Subjective:   Remains on the ventilator.  No acute events overnight.    Objective   Blood pressure (!) 142/85, pulse 72, temperature (!) 101.1 F (38.4 C), resp. rate 16, height 6\' 1"  (1.854 m), weight 105.7 kg, SpO2 95%.    Vent Mode: PRVC FiO2 (%):  [30 %] 30 % Set Rate:  [16 bmp] 16 bmp Vt Set:  [630 mL] 630 mL PEEP:  [5 cmH20] 5 cmH20 Plateau Pressure:  [20 cmH20-26 cmH20] 21 cmH20   Intake/Output Summary (Last 24 hours)  at 07/03/2023 0851 Last data filed at 07/03/2023 0600 Gross per 24 hour  Intake 1686.68 ml  Output 975 ml  Net 711.68 ml   Filed Weights   07/01/23 0441 07/02/23 0402 07/03/23 0500  Weight: 104.8 kg (!) 190.8 kg 105.7 kg    Examination: Gen:      No acute distress HEENT:  EOMI, sclera anicteric Neck:     No masses; no thyromegaly, ET tube Lungs:    Clear to auscultation bilaterally; normal respiratory effort CV:         Regular rate and rhythm; no murmurs Abd:      + bowel sounds; soft, non-tender; no palpable masses, no distension Ext:    No edema; adequate peripheral perfusion Skin:      Warm and dry; no rash Neuro: Unresponsive    Labs   CBC: Recent Labs  Lab 06/30/23 1003 06/30/23 1102 06/30/23 1510 06/30/23 1523 07/01/23 0440 07/01/23 0944 07/01/23 1304 07/02/23 0328 07/02/23 0400 07/03/23 0334 07/03/23 0535  WBC 10.6*  --  10.4  --  8.2  --   --   --  10.5  --  9.4  NEUTROABS  --   --   --   --  7.0  --   --   --  9.2*  --  8.2*  HGB 13.6   < > 12.1*   < > 12.0*   < > 12.6* 12.6* 12.1* 13.3 13.8  HCT  46.9   < > 38.2*   < > 37.3*   < > 37.0* 37.0* 37.4* 39.0 43.5  MCV 99.2  --  91.2  --  89.0  --   --   --  90.8  --  92.4  PLT 325  --  234  --  243  --   --   --  246  --  271   < > = values in this interval not displayed.   Basic Metabolic Panel: Recent Labs  Lab 06/30/23 1003 06/30/23 1102 06/30/23 1510 06/30/23 1523 07/01/23 0439 07/01/23 0440 07/01/23 0944 07/01/23 1718 07/02/23 0328 07/02/23 0400 07/02/23 1718 07/03/23 0334 07/03/23 0535  NA 139   < >  --    < >  --  140   < > 142 140 138  --  140 139  K 4.6   < >  --    < >  --  3.4*   < > 3.8 3.8 3.7  --  5.3* 4.9  CL 92*  --   --   --   --  100  --  103  --  100  --   --  104  CO2 33*  --   --   --   --  27  --  30  --  27  --   --  26  GLUCOSE 191*  --   --   --   --  128*  --  148*  --  147*  --   --  266*  BUN 15  --   --   --   --  17  --  17  --  20  --   --  17  CREATININE 1.32*   --  1.11  --   --  0.84  --  0.81  --  0.78  --   --  0.80  CALCIUM 8.6*  --   --   --   --  8.3*  --  8.1*  --  7.9*  --   --  8.4*  MG 2.1  --   --   --   --  1.6*  --  2.8*  --  2.3  --   --  2.2  PHOS 8.2*  --   --   --  1.6*  --   --  2.7  --  3.6 3.2  --  3.1   < > = values in this interval not displayed.   GFR: Estimated Creatinine Clearance: 122.1 mL/min (by C-G formula based on SCr of 0.8 mg/dL). Recent Labs  Lab 06/30/23 1003 06/30/23 1014 06/30/23 1510 06/30/23 2038 07/01/23 0439 07/01/23 0440 07/02/23 0400 07/03/23 0535  PROCALCITON <0.10  --  1.94  --  3.70  --   --   --   WBC 10.6*  --  10.4  --   --  8.2 10.5 9.4  LATICACIDVEN  --  6.2*  --  2.0*  --  <0.3*  --   --    Liver Function Tests: Recent Labs  Lab 06/30/23 1003 07/01/23 0440  AST 31 31  ALT 17 19  ALKPHOS 75 56  BILITOT 1.2* 1.0  PROT 7.0 5.9*  ALBUMIN 3.8 2.9*   No results for input(s): "LIPASE", "AMYLASE" in the last 168 hours. No results for input(s): "AMMONIA" in the last 168 hours. ABG    Component Value Date/Time   PHART 7.489 (H) 07/03/2023 1191  PCO2ART 39.9 07/03/2023 0334   PO2ART 84 07/03/2023 0334   HCO3 30.1 (H) 07/03/2023 0334   TCO2 31 07/03/2023 0334   O2SAT 97 07/03/2023 0334    Coagulation Profile: Recent Labs  Lab 06/30/23 1003 06/30/23 1530  INR 1.3* 1.3*   Cardiac Enzymes: No results for input(s): "CKTOTAL", "CKMB", "CKMBINDEX", "TROPONINI" in the last 168 hours. HbA1C: Hgb A1c MFr Bld  Date/Time Value Ref Range Status  07/01/2023 04:39 AM 6.2 (H) 4.8 - 5.6 % Final    Comment:    (NOTE) Pre diabetes:          5.7%-6.4%  Diabetes:              >6.4%  Glycemic control for   <7.0% adults with diabetes   06/30/2023 03:10 PM 6.2 (H) 4.8 - 5.6 % Final    Comment:    (NOTE) Pre diabetes:          5.7%-6.4%  Diabetes:              >6.4%  Glycemic control for   <7.0% adults with diabetes    CBG: Recent Labs  Lab 07/02/23 1558 07/02/23 1859  07/02/23 2303 07/03/23 0323 07/03/23 0815  GLUCAP 203* 203* 213* 202* 262*    Consults  Neurology Cardiology Assessment & Plan:  Principal Problem:   Cardiac arrest due to respiratory disorder (HCC) Active Problems:   Acute hypoxic on chronic hypercapnic respiratory failure (HCC)   COPD (chronic obstructive pulmonary disease) (HCC)   Myoclonus  PEA arrest secondary to COPD exacerbation with unknown downtime Respiratory Acidosis HF with reduced EF Pt presented after PEA arrest with prolonged down time. Pt's ABG shows at 7.2/96/401. LA was 6.2. This improved with ventilation and ABG showing 7.5/41/8. Currently on nebulizers and IV steroids. CTH without anoxic changes but some concern for cerebellar edema. EEG showed seizures that were ablated with IV medications. Currently on valproate. Cardiology and neurology following. MRI brain today  Blood cultures with gram-positive rods which are likely contaminant.  Started on ceftriaxone empirically.  Follow cultures to completion. Continue tube feeds.  Chronic Problems: HTN: Holding home BP meds for now HLD: restart home lipitor 80 mg. Lipid panel pending.  Prediabetes vs diabetes: A1c pending. sSSI given steroids.   Best Practice (right click and "Reselect all SmartList Selections" daily)  Diet/type: tubefeeds DVT prophylaxis: other.  On Arixtra as he does not want to use pork products. GI prophylaxis: PPI Lines: Arterial Line Foley:  Yes, and it is still needed Continuous: Propfol  Code Status:  full code Last date of multidisciplinary goals of care discussion [12/06]  The patient is critically ill with multiple organ system failure and requires high complexity decision making for assessment and support, frequent evaluation and titration of therapies, advanced monitoring, review of radiographic studies and interpretation of complex data.   Critical Care Time devoted to patient care services, exclusive of separately billable  procedures, described in this note is 35 minutes.   Chilton Greathouse MD [facility] See Amion for pager  If no response to pager , please call [phone] until 7pm After 7:00 pm call Elink  [phone] 07/03/2023, 8:51 AM

## 2023-07-03 NOTE — Progress Notes (Signed)
SLP Cancellation Note  Patient Details Name: Robert [name] MRN: [id number] DOB: 1961-04-29   Cancelled treatment:        Attempted to see pt for swallowing assessment.  Pt is intubated at this time and is not appropriate for PO trials.  SLP will follow remotely for medical readiness. If urgent needs arise, please message [facility] SLP group via secure chat or call Acute Rehab Department at [phone]   Kerrie Pleasure, MA, CCC-SLP Acute Rehabilitation Services Office: [phone] 07/03/2023, 9:38 AM

## 2023-07-03 NOTE — Progress Notes (Signed)
At bedside report, found tube feed infusing at rate of 50 ml hr.

## 2023-07-03 NOTE — Progress Notes (Addendum)
eLink Physician-Brief Progress Note Patient Name: Robert [name] DOB: 02/01/1961 MRN: [id number]   Date of Service  07/03/2023  HPI/Events of Note  Notified of persistently elevated BP despite PRN labetalol  eICU Interventions  Placed order for hydralazine 10mg  IV PRN as a second line antihypertensive.         Isaly Fasching M DELA CRUZ 07/03/2023, 5:03 AM  5:29 AM Pt with persistent fever.  Placed ice packs. Cooling blanket had been ordered earlier, but is not available in the hospital.  One time dose of IV acetaminophen ordered. Will follow response.    6:19 AM Notified that pt had vomited.  Pt had likely aspirated as well, as some vomitus was also suctioned from ETT.  Will check CXR.

## 2023-07-03 NOTE — Progress Notes (Addendum)
NEUROLOGY CONSULT FOLLOW UP NOTE   Date of service: July 03, 2023 Patient Name: Robert [name] MRN:  [id number] DOB:  03/31/1961  Brief HPI  Robert [name] is a 62 y.o. male  has a past medical history of ACE-inhibitor cough, Acute respiratory failure [facility]) (January 2015), COPD with emphysema (HCC), Hiatal hernia, Hyperlipidemia, Hypertension, Nephrolithiasis, Nocturnal hypoxemia due to emphysema [facility]), Pulmonary nodule, right (12/10/2010), and Seasonal allergies. who presented with post cardiac arrest. He became short of breath at home after walking up a flight of stairs. Wife administered a breathing treatment, he then went unresponsive. He maintained a pulse until EMS arrived at which time no pulse was detected. Therefore PEA protocol was implemented with chest compressions bagging and after epinephrine was administered he was transferred to [facility] and intubated.    Interval Hx/subjective   Propofol off since ~noon on 12/07  No clinical movement for bedside RN Febrile overnight and hypertensive  Vomitted overnight  Vitals   Vitals:   07/03/23 0630 07/03/23 0645 07/03/23 0700 07/03/23 0715  BP:    (!) 140/80  Pulse: 74 73 71 72  Resp: 16 16 16 16   Temp: (!) 101.1 F (38.4 C) (!) 101.1 F (38.4 C) (!) 101.1 F (38.4 C) (!) 101.1 F (38.4 C)  TempSrc:      SpO2: 95% 94% 94% 95%  Weight:      Height:         Body mass index is 30.74 kg/m.  Physical Exam   General: Laying comfortably in bed; in no acute distress.  HENT: Normal oropharynx and mucosa. Normal external appearance of ears and nose. ETT in place Neck: Supple, no pain or tenderness  Pulmonary: Symmetric Chest rise. Does trigger vent at times Abdomen: Soft to touch, non-tender.  Skin: No rash. Normal palpation of skin.      Neurologic Examination performed on propofol  Mental status/Cognition: . Intubated, no response to voice or peripheral extremity stimuli Speech/language: Intubated. No  attempts to communicate or follow commands. Cranial nerves:   CN II Pupils equal and reactive to light, unable to assess for VF deficit.   CN III,IV,VI Dolls eyes reflex is absent today   CN V Corneals weakly positive bL   CN VII No grimace to noxious stimuli   CN VIII Does not turn head towards speech   CN IX & X Cough intact, no gag.   CN XI Head midline   CN XII Unable to assess secondary to ETT     Sensory/Motor:  Muscle bulk: normal flaccid No myoclonic jerking with painful stimuli, no movement to max noxious stim in all four extremities, distal and proximal       Labs and Diagnostic Imaging   CBC:  Recent Labs  Lab 07/02/23 0400 07/03/23 0334 07/03/23 0535  WBC 10.5  --  9.4  NEUTROABS 9.2*  --  8.2*  HGB 12.1* 13.3 13.8  HCT 37.4* 39.0 43.5  MCV 90.8  --  92.4  PLT 246  --  271    Basic Metabolic Panel:  Lab Results  Component Value Date   NA 139 07/03/2023   K 4.9 07/03/2023   CO2 26 07/03/2023   GLUCOSE 266 (H) 07/03/2023   BUN 17 07/03/2023   CREATININE 0.80 07/03/2023   CALCIUM 8.4 (L) 07/03/2023   GFRNONAA >60 07/03/2023   GFRAA >90 08/07/2013   Lipid Panel:  Lab Results  Component Value Date   LDLCALC 73 07/01/2023   HgbA1c:  Lab Results  Component Value Date   HGBA1C 6.2 (H) 07/01/2023   Urine Drug Screen:     Component Value Date/Time   LABOPIA NONE DETECTED 06/30/2023 1252   COCAINSCRNUR NONE DETECTED 06/30/2023 1252   LABBENZ NONE DETECTED 06/30/2023 1252   AMPHETMU NONE DETECTED 06/30/2023 1252   THCU NONE DETECTED 06/30/2023 1252   LABBARB NONE DETECTED 06/30/2023 1252    Alcohol Level No results found for: "ETH" INR  Lab Results  Component Value Date   INR 1.3 (H) 06/30/2023   APTT  Lab Results  Component Value Date   APTT 31 06/30/2023   AED levels: No results found for: "PHENYTOIN", "ZONISAMIDE", "LAMOTRIGINE", "LEVETIRACETA"  CT Head without contrast(Personally reviewed): The fourth ventricle is somewhat small  in size, which may be secondary to cerebellar edema. No other finding to definitively suggest anoxic brain injury at the time of the exam. Recommend brain MRI for further evaluation.  MRI Brain(Personally reviewed): Planned for 12/8  cEEG:  06/30/2023 1339 to 07/01/2023 0715  ABNORMALITY - Myoclonic seizure, generalized - Burst suppression, generalized IMPRESSION: At the beginning of the study, patient was noted to have myoclonic seizures every 1-2 minutes. As medications were adjusted, clinical seizures abated. Subsequently, EEG showed profound diffuse encephalopathy.  07/01/2023 1339 to 07/02/2023 0930  ABNORMALITY - Burst suppression, generalized IMPRESSION: This study is suggestive of profound diffuse encephalopathy. No seizures were noted. EEG appears to be worsening compared to previous day.  CXR report pending Bibasilar infiltrates on my read   Impression   Robert [name] is a 62 y.o. male present after a cardiac arrest. EEG showing myoclonic seizures every 1-2 minutes. He was started on depakote and propofol and and became burst suppressed, then generalized suppression   Examination is notable for partially intact brainstem reflexes but otherwise no clear sign of any cortical activity.  Even off of propofol, EEG remains markedly suppressed at this time, concerning for potential cortical injury, especially given metabolic parameters are otherwise reasuring   Fever potentially secondary to pulmonary process, appreciate CCM management, goal normothermia  Recommendations  - Precedex to be started today. Will monitor EEG for myoclonic activity during transition off propofol  - If further seizure activity off of propofol would start Keppra 2000 mg loading dose followed by 1000 mg twice daily - dc LTM EEG, remains suppressed on my beside eval - MRI brain w/o contrast - Goal normothermia - Appreciate CCM management of comorbidities  - Discussed with wife at bedside, discussed  with CCM via secure chat   Addendum, EEG confirms generalized suppression.  MRI brain confirms diffuse anoxic brain injury (personally reviewed).  Discussed with CCM team who has updated family with plans for transition to comfort care on Monday or Tuesday.  Inpatient neurology will sign off at this time.  Please do not hesitate to reach out with any additional questions or concerns ______________________________________________________________________   Thank you for the opportunity to take part in the care of this patient. If you have any further questions, please contact the neurology consultation team on call. Updated oncall schedule is listed on AMION.  Signed,  Brooke Dare MD-PhD [facility] [phone]   CRITICAL CARE Performed by: Gordy Councilman   Total critical care time: 35 minutes  Critical care time was exclusive of separately billable procedures and treating other patients.  Critical care was necessary to treat or prevent imminent or life-threatening deterioration.  Critical care was time spent personally by me on the following activities: development of treatment plan with patient and/or  surrogate as well as nursing, discussions with consultants, evaluation of patient's response to treatment, examination of patient, obtaining history from patient or surrogate, ordering and performing treatments and interventions, ordering and review of laboratory studies, ordering and review of radiographic studies, pulse oximetry and re-evaluation of patient's condition.

## 2023-07-03 NOTE — Progress Notes (Signed)
Patient transported to MRI with RN, transport.

## 2023-07-03 NOTE — Progress Notes (Signed)
LTM EEG discontinued - no skin breakdown at unhook.   

## 2023-07-03 NOTE — Progress Notes (Signed)
Residual checked @0800  + to 23ml. Flushed and clamped

## 2023-07-03 NOTE — Progress Notes (Signed)
Patient transported from MRI back to 2M04 without incidence. RN, transport assisted.

## 2023-07-04 DIAGNOSIS — I468 Cardiac arrest due to other underlying condition: Secondary | ICD-10-CM | POA: Diagnosis not present

## 2023-07-04 DIAGNOSIS — J989 Respiratory disorder, unspecified: Secondary | ICD-10-CM | POA: Diagnosis not present

## 2023-07-04 LAB — BASIC METABOLIC PANEL
Anion gap: 11 (ref 5–15)
BUN: 20 mg/dL (ref 8–23)
CO2: 26 mmol/L (ref 22–32)
Calcium: 8.2 mg/dL — ABNORMAL LOW (ref 8.9–10.3)
Chloride: 101 mmol/L (ref 98–111)
Creatinine, Ser: 0.64 mg/dL (ref 0.61–1.24)
GFR, Estimated: >60 mL/min (ref >60)
Glucose, Bld: 164 mg/dL — ABNORMAL HIGH (ref 70–99)
Potassium: 4.3 mmol/L (ref 3.5–5.1)
Sodium: 138 mmol/L (ref 135–145)

## 2023-07-04 LAB — CBC WITH DIFFERENTIAL/PLATELET
Abs Immature Granulocytes: 0.03 10*3/uL (ref 0.00–0.07)
Basophils Absolute: 0 10*3/uL (ref 0.0–0.1)
Basophils Relative: 0 %
Eosinophils Absolute: 0 10*3/uL (ref 0.0–0.5)
Eosinophils Relative: 0 %
HCT: 43.2 % (ref 39.0–52.0)
Hemoglobin: 13.7 g/dL (ref 13.0–17.0)
Immature Granulocytes: 0 %
Lymphocytes Relative: 12 %
Lymphs Abs: 1.2 10*3/uL (ref 0.7–4.0)
MCH: 29 pg (ref 26.0–34.0)
MCHC: 31.7 g/dL (ref 30.0–36.0)
MCV: 91.3 fL (ref 80.0–100.0)
Monocytes Absolute: 0.9 10*3/uL (ref 0.1–1.0)
Monocytes Relative: 9 %
Neutro Abs: 7.5 10*3/uL (ref 1.7–7.7)
Neutrophils Relative %: 79 %
Platelets: 272 10*3/uL (ref 150–400)
RBC: 4.73 MIL/uL (ref 4.22–5.81)
RDW: 14.4 % (ref 11.5–15.5)
WBC: 9.6 10*3/uL (ref 4.0–10.5)
nRBC: 0 % (ref 0.0–0.2)

## 2023-07-04 LAB — CULTURE, BLOOD (ROUTINE X 2)

## 2023-07-04 LAB — TRIGLYCERIDES: Triglycerides: 126 mg/dL (ref <150)

## 2023-07-04 LAB — PHOSPHORUS: Phosphorus: 2.9 mg/dL (ref 2.5–4.6)

## 2023-07-04 LAB — GLUCOSE, CAPILLARY
Glucose-Capillary: 167 mg/dL — ABNORMAL HIGH (ref 70–99)
Glucose-Capillary: 194 mg/dL — ABNORMAL HIGH (ref 70–99)

## 2023-07-04 LAB — MAGNESIUM: Magnesium: 1.9 mg/dL (ref 1.7–2.4)

## 2023-07-04 MED ORDER — MORPHINE BOLUS VIA INFUSION
5.0000 mg | INTRAVENOUS | Status: DC | PRN
Start: 2023-07-04 — End: 2023-07-04
  Administered 2023-07-04 (×2): 5 mg via INTRAVENOUS

## 2023-07-04 MED ORDER — POLYVINYL ALCOHOL 1.4 % OP SOLN: 1.0000 [drp] | Freq: Four times a day (QID) | OPHTHALMIC | Status: DC | PRN

## 2023-07-04 MED ORDER — GLYCOPYRROLATE 1 MG PO TABS
1.0000 mg | ORAL_TABLET | ORAL | Status: DC | PRN
  Filled 2023-07-04: qty 1

## 2023-07-04 MED ORDER — MORPHINE 100MG IN NS 100ML (1MG/ML) PREMIX INFUSION
0.0000 mg/h | INTRAVENOUS | Status: DC
  Administered 2023-07-04: 5 mg/h via INTRAVENOUS
  Filled 2023-07-04: qty 100

## 2023-07-04 MED ORDER — ACETAMINOPHEN 325 MG PO TABS: 650.0000 mg | ORAL_TABLET | Freq: Four times a day (QID) | ORAL | Status: DC | PRN

## 2023-07-04 MED ORDER — GLYCOPYRROLATE 0.2 MG/ML IJ SOLN: 0.2000 mg | INTRAMUSCULAR | Status: DC | PRN

## 2023-07-04 MED ORDER — ACETAMINOPHEN 650 MG RE SUPP: 650.0000 mg | Freq: Four times a day (QID) | RECTAL | Status: DC | PRN

## 2023-07-05 LAB — CULTURE, BLOOD (ROUTINE X 2): Culture: NO GROWTH

## 2023-07-06 LAB — CULTURE, BLOOD (ROUTINE X 2)
Culture: NO GROWTH
Culture: NO GROWTH

## 2023-07-25 ENCOUNTER — Telehealth: Payer: Self-pay | Admitting: Pulmonary Disease

## 2023-07-25 NOTE — Telephone Encounter (Signed)
Robert [name] states patient is requesting COPD be added to the death certificate. Robert [name] phone number is [phone] and [phone].

## 2023-07-27 NOTE — Death Summary Note (Signed)
  DEATH SUMMARY   Patient Details  Name: Robert [name] MRN: [id number] DOB: 1960-09-29  Admission/Discharge Information   Admit Date:  Jul 05, 2023  Date of Death: Date of Death: 07/09/23  Time of Death: Time of Death: 1131/07/30  Length of Stay: 4  Referring Physician: [name], MD (Inactive)   Reason(s) for Hospitalization   Cardiac arrest  Diagnoses  Preliminary cause of death:  Acute anoxic encephalopathy Cardiac arrest secondary to hypoxia Acute on chronic hypoxic, hypercarbic respiratory failure COPD exacerbation DNR, comfort measures  Secondary Diagnoses (including complications and co-morbidities):  Principal Problem:   Cardiac arrest due to respiratory disorder (HCC) Active Problems:   Acute hypoxic on chronic hypercapnic respiratory failure (HCC)   COPD (chronic obstructive pulmonary disease) [facility])   Myoclonus   Brief Hospital Course (including significant findings, care, treatment, and services provided and events leading to death)   [name] is a 63 year old male former smoker with known COPD.  He was in his usual state of poor health with shortness of breath and after walking up a set of steps he asked his wife for breathing treatment and while administering the breathing treatment she noticed that he became unconscious.  He maintained a pulse until EMS arrived at which time no pulse was detected.  Therefore PEA protocol was implemented with chest compressions bagging and after epinephrine was administered he was transferred to [facility] and intubated.  Initially was on epinephrine drip Levophed drip which was weaned off.  He started to have myoclonic movements and seizures on admission which reported a poor prognosis.  Neurologist was consulted to started continuous EEG monitoring and antiepileptics.  Cardiology was also consulted for echocardiogram showing LVEF of 35 to 40% but they felt that her presentation was more due to respiratory arrest.  He  continued to have poor mental status with myoclonic episodes and MRI obtained on day 3 showed severe anoxic brain injury.  Given these findings and poor chance of meaningful recovery his wife made the decision to transition to full comfort measures.  Signature:   Chilton Greathouse MD [place] [facility] See Amion for pager  If no response to pager , please call [phone] until 7pm After 7:00 pm call Elink  [phone] 07/21/2023, 7:42 PM

## 2023-07-27 NOTE — Progress Notes (Signed)
SLP Cancellation Note  Patient Details Name: Robert [name] MRN: [id number] DOB: 04/06/1961   Cancelled treatment:       Reason Eval/Treat Not Completed: Patient not medically ready. Pt still on vent as of this morning. Will f/u as able.     Mahala Menghini., M.A. CCC-SLP Acute Rehabilitation Services Office [phone]  Secure chat preferred  07/20/2023, 7:39 AM

## 2023-07-27 NOTE — Progress Notes (Signed)
NAME:  Robert [name], MRN:  [id number], DOB:  1961-04-27, LOS: 4 ADMISSION DATE:  06/30/2023, CONSULTATION DATE:  06/30/23 REFERRING MD:  12/05 CHIEF COMPLAINT:  Cardiac Arrest   History of Present Illness:  Mr. Mcquaide is a 63 year old male former smoker with known COPD that is oxygen dependent and is followed by Dr. Craige Cotta in the [facility].  He was in his usual state of poor health with shortness of breath and after walking up a set of steps he asked his wife for breathing treatment and while administering the breathing treatment she noticed that he became unconscious.  He maintained a pulse until EMS arrived at which time no pulse was detected.  Therefore PEA protocol was implemented with chest compressions bagging and after epinephrine was administered he was transferred to [facility] and intubated.  Initially was on epinephrine drip Levophed drip but now is on no vasopressor support at all.  Extremely acidotic with a pH of 7.1 for which she was given bicarbonate x 2 and respiratory rate was increased to 34.  He is off sedation and is not following commands.  He will need a head CT and possibly further neurological evaluations.  He has orders written to go to the coronary intensive care unit.  Pertinent  Medical History  Chronic hypoxic respiratory failure 2/2 to COPD HTN HLD T2DM Significant Hospital Events: Including procedures, antibiotic start and stop dates in addition to other pertinent events   12/05-admitted to ICU, intubated 12/9-MRI with severe diffuse anoxic injury Interim History / Subjective:   Remains on the ventilator.  Objective   Blood pressure 125/76, pulse 63, temperature (!) 100.6 F (38.1 C), resp. rate 16, height 6\' 1"  (1.854 m), weight 105 kg, SpO2 94%.    Vent Mode: PRVC FiO2 (%):  [30 %] 30 % Set Rate:  [16 bmp] 16 bmp Vt Set:  [360 mL-630 mL] 360 mL PEEP:  [5 cmH20] 5 cmH20 Plateau Pressure:  [18 cmH20-27 cmH20] 20 cmH20   Intake/Output Summary  (Last 24 hours) at 07-10-2023 0720 Last data filed at 07/10/2023 0400 Gross per 24 hour  Intake 787.53 ml  Output 2920 ml  Net -2132.47 ml   Filed Weights   07/02/23 0402 07/03/23 0500 2023/07/10 0500  Weight: (!) 190.8 kg 105.7 kg 105 kg    Examination: Gen:      No acute distress HEENT:  EOMI, sclera anicteric Neck:     No masses; no thyromegaly Lungs:    Clear to auscultation bilaterally; normal respiratory effort CV:         Regular rate and rhythm; no murmurs Abd:      + bowel sounds; soft, non-tender; no palpable masses, no distension Ext:    No edema; adequate peripheral perfusion Skin:      Warm and dry; no rash Neuro: Unresponsive  Labs   CBC: Recent Labs  Lab 06/30/23 1510 06/30/23 1523 07/01/23 0440 07/01/23 0944 07/02/23 0328 07/02/23 0400 07/03/23 0334 07/03/23 0535 July 10, 2023 0359  WBC 10.4  --  8.2  --   --  10.5  --  9.4 9.6  NEUTROABS  --   --  7.0  --   --  9.2*  --  8.2* 7.5  HGB 12.1*   < > 12.0*   < > 12.6* 12.1* 13.3 13.8 13.7  HCT 38.2*   < > 37.3*   < > 37.0* 37.4* 39.0 43.5 43.2  MCV 91.2  --  89.0  --   --  90.8  --  92.4 91.3  PLT 234  --  243  --   --  246  --  271 272   < > = values in this interval not displayed.   Basic Metabolic Panel: Recent Labs  Lab 07/01/23 0440 07/01/23 0944 07/01/23 1718 07/02/23 0328 07/02/23 0400 07/02/23 1718 07/03/23 0334 07/03/23 0535 07/03/23 1651 07/06/2023 0359  NA 140   < > 142 140 138  --  140 139  --  138  K 3.4*   < > 3.8 3.8 3.7  --  5.3* 4.9  --  4.3  CL 100  --  103  --  100  --   --  104  --  101  CO2 27  --  30  --  27  --   --  26  --  26  GLUCOSE 128*  --  148*  --  147*  --   --  266*  --  164*  BUN 17  --  17  --  20  --   --  17  --  20  CREATININE 0.84  --  0.81  --  0.78  --   --  0.80  --  0.64  CALCIUM 8.3*  --  8.1*  --  7.9*  --   --  8.4*  --  8.2*  MG 1.6*  --  2.8*  --  2.3  --   --  2.2  --  1.9  PHOS  --   --  2.7  --  3.6 3.2  --  3.1 3.5 2.9   < > = values in this  interval not displayed.   GFR: Estimated Creatinine Clearance: 121.7 mL/min (by C-G formula based on SCr of 0.64 mg/dL). Recent Labs  Lab Jul 02, 2023 1003 Jul 02, 2023 1014 07-02-2023 1510 07-02-23 2038 07/01/23 0439 07/01/23 0440 07/02/23 0400 07/03/23 0535 07/06/2023 0359  PROCALCITON <0.10  --  1.94  --  3.70  --   --   --   --   WBC 10.6*  --  10.4  --   --  8.2 10.5 9.4 9.6  LATICACIDVEN  --  6.2*  --  2.0*  --  <0.3*  --   --   --    Liver Function Tests: Recent Labs  Lab 07-02-23 1003 07/01/23 0440  AST 31 31  ALT 17 19  ALKPHOS 75 56  BILITOT 1.2* 1.0  PROT 7.0 5.9*  ALBUMIN 3.8 2.9*   No results for input(s): "LIPASE", "AMYLASE" in the last 168 hours. No results for input(s): "AMMONIA" in the last 168 hours. ABG    Component Value Date/Time   PHART 7.489 (H) 07/03/2023 0334   PCO2ART 39.9 07/03/2023 0334   PO2ART 84 07/03/2023 0334   HCO3 30.1 (H) 07/03/2023 0334   TCO2 31 07/03/2023 0334   O2SAT 97 07/03/2023 0334    Coagulation Profile: Recent Labs  Lab 07/02/23 1003 07-02-23 1530  INR 1.3* 1.3*   Cardiac Enzymes: No results for input(s): "CKTOTAL", "CKMB", "CKMBINDEX", "TROPONINI" in the last 168 hours. HbA1C: Hgb A1c MFr Bld  Date/Time Value Ref Range Status  07/01/2023 04:39 AM 6.2 (H) 4.8 - 5.6 % Final    Comment:    (NOTE) Pre diabetes:          5.7%-6.4%  Diabetes:              >6.4%  Glycemic control for   <7.0% adults with diabetes  06/30/2023 03:10 PM 6.2 (H) 4.8 - 5.6 % Final    Comment:    (NOTE) Pre diabetes:          5.7%-6.4%  Diabetes:              >6.4%  Glycemic control for   <7.0% adults with diabetes    CBG: Recent Labs  Lab 07/03/23 1220 07/03/23 1512 07/03/23 1933 07/03/23 2305 07-08-2023 0321  GLUCAP 164* 127* 167* 153* 167*    Consults  Neurology Cardiology Assessment & Plan:  Principal Problem:   Cardiac arrest due to respiratory disorder (HCC) Active Problems:   Acute hypoxic on chronic hypercapnic  respiratory failure (HCC)   COPD (chronic obstructive pulmonary disease) (HCC)   Myoclonus  PEA arrest secondary to COPD exacerbation with unknown downtime Respiratory Acidosis HF with reduced EF Pt presented after PEA arrest with prolonged down time. Pt's ABG shows at 7.2/96/401. LA was 6.2. This improved with ventilation and ABG showing 7.5/41/8. Currently on nebulizers and IV steroids. CTH without anoxic changes but some concern for cerebellar edema. EEG showed seizures that were ablated with IV medications. Currently on valproate. Cardiology and neurology following. MRI brain today  Blood cultures with gram-positive rods which are likely contaminant.  Started on ceftriaxone empirically.  Follow cultures to completion. Continue tube feeds.  Acute encephalopathy secondary to severe anoxic brain injury Continue neuromonitoring  Chronic Problems: HTN: Holding home BP meds for now HLD: restart home lipitor 80 mg. Lipid panel pending.  Prediabetes vs diabetes: A1c pending. sSSI given steroids.   Goals of care Discussed with wife at bedside.  She does not want to prolong care as chance of neurorecovery is very poor.  She plans on withdrawing care today after family has a chance to visit DNR  Best Practice (right click and "Reselect all SmartList Selections" daily)  Diet/type: tubefeeds DVT prophylaxis: other.  On Arixtra as he does not want to use pork products. GI prophylaxis: PPI Lines: Arterial Line Foley:  Yes, and it is still needed Continuous: Propfol  Code Status:  DNR Last date of multidisciplinary goals of care discussion [12/06]  The patient is critically ill with multiple organ system failure and requires high complexity decision making for assessment and support, frequent evaluation and titration of therapies, advanced monitoring, review of radiographic studies and interpretation of complex data.   Critical Care Time devoted to patient care services, exclusive of  separately billable procedures, described in this note is 35 minutes.   Chilton Greathouse MD [place] [facility] See Amion for pager  If no response to pager , please call [phone] until 7pm After 7:00 pm call Elink  [phone] 07-08-23, 7:20 AM

## 2023-07-27 NOTE — Progress Notes (Signed)
PCCM note  Wife is ready to transition to comfort measures.  Orders placed for withdrawal of care.  Chilton Greathouse MD [place] [facility] See Amion for pager  If no response to pager , please call [phone] until 7pm After 7:00 pm call Elink  [phone] 07/20/2023, 10:26 AM

## 2023-07-27 NOTE — Progress Notes (Signed)
Pt extubated to comfort care measures.

## 2023-07-27 NOTE — Plan of Care (Signed)
Spoke with family at the bedside who is quite tearful.  He remains intubated and sedated.  No cardiology intervention is recommended.  Cardiology will sign off

## 2023-07-27 NOTE — Progress Notes (Signed)
Heart Failure Navigator Progress Note  Assessed for Heart & Vascular TOC clinic readiness.  Patient does not meet criteria due to patient transitioning to Comfort Care per MD. .   Navigator will sign off at this time.   Rhae Hammock, BSN, Scientist, clinical (histocompatibility and immunogenetics) Only

## 2023-07-27 DEATH — deceased

## 2023-09-02 NOTE — Telephone Encounter (Signed)
 Robert [name] wife checking on message for death certificate. Robert [name] phone number is [phone].

## 2023-09-05 NOTE — Telephone Encounter (Signed)
 Dr Waylan Haggard, can you add COPD to death cert?

## 2023-10-12 NOTE — Telephone Encounter (Signed)
 I have added COPD exacerbation as a cause of death to his death certificate as an addendum.  Nothing further needed.

## 2023-11-07 ENCOUNTER — Telehealth: Payer: Self-pay

## 2023-11-07 NOTE — Telephone Encounter (Signed)
 Copied from CRM [id number]. Topic: General - Other >> Nov 07, 2023 12:24 PM Whitney O wrote: Reason for CRM: nicola sanders calling from   vital records needing to speak with someone about a supplement case that was not finished by doctor mannam  Case number [id number] Incomplete supplement or we doing a new one or we canceling . Give me some direction on what we are doing with it . Are we going to move forward with the cause of death that already exist . This has been since 2024 please give me some direction can't give the family a death certificate until this is complete  [email or web address]   Please advise for death certificate.
